# Patient Record
Sex: Female | Born: 1975 | Race: White | Hispanic: No | Smoking: Never smoker
Health system: Southern US, Community
[De-identification: ages and names within clinical notes are randomized; demographics above are authoritative.]

## PROBLEM LIST (undated history)

## (undated) DIAGNOSIS — F329 Major depressive disorder, single episode, unspecified: Secondary | ICD-10-CM

## (undated) DIAGNOSIS — I10 Essential (primary) hypertension: Secondary | ICD-10-CM

## (undated) DIAGNOSIS — E079 Disorder of thyroid, unspecified: Secondary | ICD-10-CM

## (undated) DIAGNOSIS — R569 Unspecified convulsions: Secondary | ICD-10-CM

## (undated) DIAGNOSIS — R200 Anesthesia of skin: Secondary | ICD-10-CM

## (undated) DIAGNOSIS — R251 Tremor, unspecified: Secondary | ICD-10-CM

## (undated) DIAGNOSIS — R202 Paresthesia of skin: Secondary | ICD-10-CM

## (undated) DIAGNOSIS — F32A Depression, unspecified: Secondary | ICD-10-CM

## (undated) DIAGNOSIS — F419 Anxiety disorder, unspecified: Secondary | ICD-10-CM

## (undated) DIAGNOSIS — G2581 Restless legs syndrome: Secondary | ICD-10-CM

## (undated) DIAGNOSIS — E876 Hypokalemia: Secondary | ICD-10-CM

## (undated) DIAGNOSIS — I2510 Atherosclerotic heart disease of native coronary artery without angina pectoris: Secondary | ICD-10-CM

## (undated) DIAGNOSIS — M797 Fibromyalgia: Secondary | ICD-10-CM

## (undated) DIAGNOSIS — E162 Hypoglycemia, unspecified: Secondary | ICD-10-CM

## (undated) DIAGNOSIS — G43909 Migraine, unspecified, not intractable, without status migrainosus: Secondary | ICD-10-CM

## (undated) DIAGNOSIS — E039 Hypothyroidism, unspecified: Secondary | ICD-10-CM

## (undated) DIAGNOSIS — E781 Pure hyperglyceridemia: Secondary | ICD-10-CM

## (undated) HISTORY — PX: ABDOMINAL HYSTERECTOMY: SHX81

## (undated) HISTORY — DX: Major depressive disorder, single episode, unspecified: F32.9

## (undated) HISTORY — DX: Anesthesia of skin: R20.2

## (undated) HISTORY — DX: Migraine, unspecified, not intractable, without status migrainosus: G43.909

## (undated) HISTORY — DX: Atherosclerotic heart disease of native coronary artery without angina pectoris: I25.10

## (undated) HISTORY — DX: Unspecified convulsions: R56.9

## (undated) HISTORY — DX: Disorder of thyroid, unspecified: E07.9

## (undated) HISTORY — DX: Tremor, unspecified: R25.1

## (undated) HISTORY — DX: Anxiety disorder, unspecified: F41.9

## (undated) HISTORY — DX: Hypothyroidism, unspecified: E03.9

## (undated) HISTORY — DX: Hypoglycemia, unspecified: E16.2

## (undated) HISTORY — DX: Hypokalemia: E87.6

## (undated) HISTORY — DX: Fibromyalgia: M79.7

## (undated) HISTORY — DX: Anesthesia of skin: R20.0

## (undated) HISTORY — DX: Pure hyperglyceridemia: E78.1

## (undated) HISTORY — DX: Depression, unspecified: F32.A

## (undated) HISTORY — PX: BREAST BIOPSY: SHX20

## (undated) HISTORY — DX: Restless legs syndrome: G25.81

---

## 1998-11-13 ENCOUNTER — Emergency Department (HOSPITAL_COMMUNITY): Admission: EM | Admit: 1998-11-13 | Discharge: 1998-11-13 | Payer: Self-pay | Admitting: Emergency Medicine

## 1999-03-02 ENCOUNTER — Inpatient Hospital Stay (HOSPITAL_COMMUNITY): Admission: AD | Admit: 1999-03-02 | Discharge: 1999-03-02 | Payer: Self-pay | Admitting: Obstetrics and Gynecology

## 1999-03-10 ENCOUNTER — Other Ambulatory Visit: Admission: RE | Admit: 1999-03-10 | Discharge: 1999-03-10 | Payer: Self-pay | Admitting: Obstetrics and Gynecology

## 1999-09-24 ENCOUNTER — Inpatient Hospital Stay (HOSPITAL_COMMUNITY): Admission: AD | Admit: 1999-09-24 | Discharge: 1999-09-26 | Payer: Self-pay | Admitting: Obstetrics and Gynecology

## 1999-10-28 ENCOUNTER — Other Ambulatory Visit: Admission: RE | Admit: 1999-10-28 | Discharge: 1999-10-28 | Payer: Self-pay | Admitting: Obstetrics and Gynecology

## 2000-11-01 ENCOUNTER — Other Ambulatory Visit: Admission: RE | Admit: 2000-11-01 | Discharge: 2000-11-01 | Payer: Self-pay | Admitting: Obstetrics and Gynecology

## 2001-07-27 ENCOUNTER — Ambulatory Visit (HOSPITAL_COMMUNITY): Admission: RE | Admit: 2001-07-27 | Discharge: 2001-07-27 | Payer: Self-pay | Admitting: Gastroenterology

## 2001-07-27 ENCOUNTER — Encounter: Payer: Self-pay | Admitting: Gastroenterology

## 2001-12-14 ENCOUNTER — Other Ambulatory Visit: Admission: RE | Admit: 2001-12-14 | Discharge: 2001-12-14 | Payer: Self-pay | Admitting: Obstetrics and Gynecology

## 2002-02-27 ENCOUNTER — Encounter (INDEPENDENT_AMBULATORY_CARE_PROVIDER_SITE_OTHER): Payer: Self-pay | Admitting: Specialist

## 2002-02-27 ENCOUNTER — Ambulatory Visit (HOSPITAL_COMMUNITY): Admission: RE | Admit: 2002-02-27 | Discharge: 2002-02-27 | Payer: Self-pay | Admitting: Obstetrics and Gynecology

## 2003-01-06 HISTORY — PX: ABDOMINAL HYSTERECTOMY: SHX81

## 2003-01-29 ENCOUNTER — Other Ambulatory Visit: Admission: RE | Admit: 2003-01-29 | Discharge: 2003-01-29 | Payer: Self-pay | Admitting: Obstetrics and Gynecology

## 2004-02-14 ENCOUNTER — Other Ambulatory Visit: Admission: RE | Admit: 2004-02-14 | Discharge: 2004-02-14 | Payer: Self-pay | Admitting: Obstetrics and Gynecology

## 2004-03-26 ENCOUNTER — Encounter (INDEPENDENT_AMBULATORY_CARE_PROVIDER_SITE_OTHER): Payer: Self-pay | Admitting: *Deleted

## 2004-03-26 ENCOUNTER — Observation Stay (HOSPITAL_COMMUNITY): Admission: RE | Admit: 2004-03-26 | Discharge: 2004-03-27 | Payer: Self-pay | Admitting: Obstetrics and Gynecology

## 2005-03-09 ENCOUNTER — Other Ambulatory Visit: Admission: RE | Admit: 2005-03-09 | Discharge: 2005-03-09 | Payer: Self-pay | Admitting: Obstetrics and Gynecology

## 2007-10-21 ENCOUNTER — Encounter: Payer: Self-pay | Admitting: Internal Medicine

## 2008-07-26 ENCOUNTER — Encounter: Payer: Self-pay | Admitting: Internal Medicine

## 2008-08-03 ENCOUNTER — Ambulatory Visit: Payer: Self-pay | Admitting: Internal Medicine

## 2008-08-03 DIAGNOSIS — E039 Hypothyroidism, unspecified: Secondary | ICD-10-CM | POA: Insufficient documentation

## 2008-09-26 ENCOUNTER — Ambulatory Visit: Payer: Self-pay | Admitting: Internal Medicine

## 2008-09-26 DIAGNOSIS — F329 Major depressive disorder, single episode, unspecified: Secondary | ICD-10-CM

## 2008-09-26 LAB — CONVERTED CEMR LAB: TSH: 1.67 microintl units/mL (ref 0.35–5.50)

## 2008-09-28 ENCOUNTER — Telehealth: Payer: Self-pay | Admitting: Internal Medicine

## 2008-12-13 ENCOUNTER — Telehealth: Payer: Self-pay | Admitting: Internal Medicine

## 2008-12-24 ENCOUNTER — Ambulatory Visit: Payer: Self-pay | Admitting: Internal Medicine

## 2008-12-24 ENCOUNTER — Telehealth (INDEPENDENT_AMBULATORY_CARE_PROVIDER_SITE_OTHER): Payer: Self-pay | Admitting: *Deleted

## 2008-12-24 LAB — CONVERTED CEMR LAB
BUN: 8 mg/dL (ref 6–23)
Basophils Absolute: 0 10*3/uL (ref 0.0–0.1)
Basophils Relative: 0.9 % (ref 0.0–3.0)
CO2: 31 meq/L (ref 19–32)
Calcium: 9.2 mg/dL (ref 8.4–10.5)
Chloride: 102 meq/L (ref 96–112)
Creatinine, Ser: 0.7 mg/dL (ref 0.4–1.2)
Eosinophils Absolute: 0.1 10*3/uL (ref 0.0–0.7)
Eosinophils Relative: 1.8 % (ref 0.0–5.0)
GFR calc non Af Amer: 101.63 mL/min (ref >60)
Glucose, Bld: 65 mg/dL — ABNORMAL LOW (ref 70–99)
HCT: 42.5 % (ref 36.0–46.0)
Hemoglobin: 14.6 g/dL (ref 12.0–15.0)
Lymphocytes Relative: 33.2 % (ref 12.0–46.0)
Lymphs Abs: 1.5 10*3/uL (ref 0.7–4.0)
MCHC: 34.3 g/dL (ref 30.0–36.0)
MCV: 94.8 fL (ref 78.0–100.0)
Monocytes Absolute: 0.5 10*3/uL (ref 0.1–1.0)
Monocytes Relative: 10.5 % (ref 3.0–12.0)
Neutro Abs: 2.5 10*3/uL (ref 1.4–7.7)
Neutrophils Relative %: 53.6 % (ref 43.0–77.0)
Platelets: 191 10*3/uL (ref 150.0–400.0)
Potassium: 3.4 meq/L — ABNORMAL LOW (ref 3.5–5.1)
RBC: 4.48 M/uL (ref 3.87–5.11)
RDW: 12 % (ref 11.5–14.6)
Sodium: 140 meq/L (ref 135–145)
TSH: 1.66 microintl units/mL (ref 0.35–5.50)
WBC: 4.6 10*3/uL (ref 4.5–10.5)

## 2008-12-25 ENCOUNTER — Encounter: Payer: Self-pay | Admitting: Internal Medicine

## 2008-12-25 ENCOUNTER — Telehealth (INDEPENDENT_AMBULATORY_CARE_PROVIDER_SITE_OTHER): Payer: Self-pay | Admitting: *Deleted

## 2009-01-09 ENCOUNTER — Ambulatory Visit: Payer: Self-pay | Admitting: Internal Medicine

## 2009-01-09 LAB — CONVERTED CEMR LAB
BUN: 9 mg/dL (ref 6–23)
CO2: 32 meq/L (ref 19–32)
Calcium: 9.1 mg/dL (ref 8.4–10.5)
Chloride: 105 meq/L (ref 96–112)
Creatinine, Ser: 0.6 mg/dL (ref 0.4–1.2)
GFR calc non Af Amer: 121.39 mL/min (ref >60)
Glucose, Bld: 58 mg/dL — ABNORMAL LOW (ref 70–99)
Insulin: 13 microintl units/mL (ref 3–28)
Potassium: 4 meq/L (ref 3.5–5.1)
Sodium: 141 meq/L (ref 135–145)

## 2009-01-10 ENCOUNTER — Telehealth (INDEPENDENT_AMBULATORY_CARE_PROVIDER_SITE_OTHER): Payer: Self-pay | Admitting: *Deleted

## 2009-01-10 ENCOUNTER — Encounter: Payer: Self-pay | Admitting: Internal Medicine

## 2009-01-10 ENCOUNTER — Telehealth: Payer: Self-pay | Admitting: Internal Medicine

## 2009-01-30 ENCOUNTER — Ambulatory Visit: Payer: Self-pay | Admitting: Endocrinology

## 2009-02-11 ENCOUNTER — Telehealth: Payer: Self-pay | Admitting: Endocrinology

## 2009-04-23 ENCOUNTER — Telehealth: Payer: Self-pay | Admitting: Internal Medicine

## 2009-05-03 ENCOUNTER — Telehealth: Payer: Self-pay | Admitting: Internal Medicine

## 2009-05-21 ENCOUNTER — Ambulatory Visit: Payer: Self-pay | Admitting: Internal Medicine

## 2009-05-21 LAB — CONVERTED CEMR LAB: TSH: 2.13 microintl units/mL (ref 0.35–5.50)

## 2009-05-22 ENCOUNTER — Encounter: Payer: Self-pay | Admitting: Internal Medicine

## 2009-05-23 ENCOUNTER — Telehealth: Payer: Self-pay | Admitting: Internal Medicine

## 2009-08-05 ENCOUNTER — Telehealth: Payer: Self-pay | Admitting: Internal Medicine

## 2009-08-12 ENCOUNTER — Telehealth: Payer: Self-pay | Admitting: Internal Medicine

## 2009-08-15 ENCOUNTER — Ambulatory Visit: Payer: Self-pay | Admitting: Internal Medicine

## 2009-08-15 DIAGNOSIS — IMO0001 Reserved for inherently not codable concepts without codable children: Secondary | ICD-10-CM

## 2009-08-15 DIAGNOSIS — G2581 Restless legs syndrome: Secondary | ICD-10-CM | POA: Insufficient documentation

## 2009-08-15 LAB — CONVERTED CEMR LAB
ALT: 26 units/L (ref 0–35)
AST: 24 units/L (ref 0–37)
Albumin: 4.5 g/dL (ref 3.5–5.2)
Alkaline Phosphatase: 46 units/L (ref 39–117)
BUN: 13 mg/dL (ref 6–23)
Basophils Absolute: 0 10*3/uL (ref 0.0–0.1)
Basophils Relative: 0.7 % (ref 0.0–3.0)
Bilirubin, Direct: 0.1 mg/dL (ref 0.0–0.3)
CO2: 33 meq/L — ABNORMAL HIGH (ref 19–32)
Calcium: 9.2 mg/dL (ref 8.4–10.5)
Chloride: 102 meq/L (ref 96–112)
Creatinine, Ser: 0.6 mg/dL (ref 0.4–1.2)
Eosinophils Absolute: 0.1 10*3/uL (ref 0.0–0.7)
Eosinophils Relative: 1.2 % (ref 0.0–5.0)
GFR calc non Af Amer: 112.28 mL/min (ref >60)
Glucose, Bld: 67 mg/dL — ABNORMAL LOW (ref 70–99)
HCT: 38.7 % (ref 36.0–46.0)
Hemoglobin: 13.3 g/dL (ref 12.0–15.0)
Lymphocytes Relative: 23.6 % (ref 12.0–46.0)
Lymphs Abs: 1.2 10*3/uL (ref 0.7–4.0)
MCHC: 34.3 g/dL (ref 30.0–36.0)
MCV: 94.1 fL (ref 78.0–100.0)
Monocytes Absolute: 0.4 10*3/uL (ref 0.1–1.0)
Monocytes Relative: 8.7 % (ref 3.0–12.0)
Neutro Abs: 3.3 10*3/uL (ref 1.4–7.7)
Neutrophils Relative %: 65.8 % (ref 43.0–77.0)
Platelets: 205 10*3/uL (ref 150.0–400.0)
Potassium: 3.9 meq/L (ref 3.5–5.1)
RBC: 4.11 M/uL (ref 3.87–5.11)
RDW: 12.5 % (ref 11.5–14.6)
Sed Rate: 10 mm/hr (ref 0–22)
Sodium: 142 meq/L (ref 135–145)
TSH: 1.29 microintl units/mL (ref 0.35–5.50)
Total Bilirubin: 0.6 mg/dL (ref 0.3–1.2)
Total CK: 73 units/L (ref 7–177)
Total Protein: 7.1 g/dL (ref 6.0–8.3)
WBC: 5.1 10*3/uL (ref 4.5–10.5)

## 2009-08-16 ENCOUNTER — Telehealth: Payer: Self-pay | Admitting: Internal Medicine

## 2009-10-29 ENCOUNTER — Telehealth: Payer: Self-pay | Admitting: Internal Medicine

## 2009-11-25 ENCOUNTER — Telehealth: Payer: Self-pay | Admitting: Internal Medicine

## 2009-12-10 ENCOUNTER — Ambulatory Visit: Payer: Self-pay | Admitting: Internal Medicine

## 2009-12-10 LAB — CONVERTED CEMR LAB: TSH: 1.39 microintl units/mL (ref 0.35–5.50)

## 2009-12-13 ENCOUNTER — Telehealth: Payer: Self-pay | Admitting: Internal Medicine

## 2010-01-27 ENCOUNTER — Telehealth: Payer: Self-pay | Admitting: Internal Medicine

## 2010-02-04 NOTE — Assessment & Plan Note (Signed)
Summary: PER PT 2-3 WK FU--POTASSIUM  STC   Vital Signs:  Patient profile:   35 year old female Height:      65 inches Weight:      147 pounds BMI:     24.55 O2 Sat:      99 % on Room air Temp:     97.8 degrees F oral Pulse rate:   78 / minute Pulse rhythm:   regular BP sitting:   108 / 64  (left arm) Cuff size:   large  Vitals Entered By: Rock Nephew CMA (January 09, 2009 9:57 AM)  O2 Flow:  Room air CC: follow-up visit, Depression   Primary Care Provider:  Yetta Barre  CC:  follow-up visit and Depression.  History of Present Illness: She returns for f/up and reports that she feels better with much less dizziness and fatigue. She has liberalized her intake of fruits, especially bananas.   Preventive Screening-Counseling & Management  Alcohol-Tobacco     Alcohol drinks/day: 0     Smoking Status: never  Clinical Review Panels:  CBC   WBC:  4.6 (12/24/2008)   RBC:  4.48 (12/24/2008)   Hgb:  14.6 (12/24/2008)   Hct:  42.5 (12/24/2008)   Platelets:  191.0 (12/24/2008)   MCV  94.8 (12/24/2008)   MCHC  34.3 (12/24/2008)   RDW  12.0 (12/24/2008)   PMN:  53.6 (12/24/2008)   Lymphs:  33.2 (12/24/2008)   Monos:  10.5 (12/24/2008)   Eosinophils:  1.8 (12/24/2008)   Basophil:  0.9 (12/24/2008)  Complete Metabolic Panel   Glucose:  65 (12/24/2008)   Sodium:  140 (12/24/2008)   Potassium:  3.4 (12/24/2008)   Chloride:  102 (12/24/2008)   CO2:  31 (12/24/2008)   BUN:  8 (12/24/2008)   Creatinine:  0.7 (12/24/2008)   Calcium:  9.2 (12/24/2008)   Current Medications (verified): 1)  Synthroid 50 Mcg Tabs (Levothyroxine Sodium) .Marland Kitchen.. 1 Tab By Mouth Once Daily 2)  Cymbalta 30 Mg Cpep (Duloxetine Hcl) .... Once Daily For Depression  Allergies (verified): No Known Drug Allergies  Past History:  Past Medical History: Reviewed history from 12/24/2008 and no changes required. Endometriosis  Hypothyroidism  Past Surgical History: Reviewed history from 08/03/2008 and  no changes required. Hysterectomy  Family History: Reviewed history from 08/03/2008 and no changes required. Family History Diabetes 1st degree relative Family History High cholesterol Family History Hypertension Family History Thyroid disease  Social History: Reviewed history from 08/03/2008 and no changes required. Married Never Smoked Alcohol use-no Drug use-no Regular exercise-yes  Review of Systems  The patient denies anorexia, weight loss, weight gain, chest pain, abdominal pain, and depression.   Psych:  Denies anxiety, depression, easily angered, easily tearful, irritability, mental problems, panic attacks, sense of great danger, suicidal thoughts/plans, and thoughts of violence.  Physical Exam  General:  alert, well-developed, well-nourished, well-hydrated, normal appearance, healthy-appearing, cooperative to examination, and good hygiene.   Mouth:  Oral mucosa and oropharynx without lesions or exudates.  Teeth in good repair. Neck:  supple, full ROM, no masses, and no neck tenderness.   Lungs:  Normal respiratory effort, chest expands symmetrically. Lungs are clear to auscultation, no crackles or wheezes. Heart:  Normal rate and regular rhythm. S1 and S2 normal without gallop, murmur, click, rub or other extra sounds. Abdomen:  Bowel sounds positive,abdomen soft and non-tender without masses, organomegaly or hernias noted. Skin:  Intact without suspicious lesions or rashes Psych:  Cognition and judgment appear intact. Alert and cooperative with  normal attention span and concentration. No apparent delusions, illusions, hallucinations   Impression & Recommendations:  Problem # 1:  HYPOGLYCEMIA, UNSPECIFIED (ICD-251.2) Assessment Improved will check a random insulin level to look for hyperinsulinemia. Orders: Venipuncture (16109) TLB-BMP (Basic Metabolic Panel-BMET) (80048-METABOL) T- * Misc. Laboratory test [id number])  Problem # 2:  HYPOKALEMIA, MILD  (ICD-276.8) Assessment: Improved  Orders: Venipuncture (09811) TLB-BMP (Basic Metabolic Panel-BMET) (80048-METABOL)  Problem # 3:  DEPRESSIVE DISORDER (ICD-311) Assessment: Improved  Her updated medication list for this problem includes:    Cymbalta 30 Mg Cpep (Duloxetine hcl) ..... Once daily for depression  Discussed treatment options, including trial of antidpressant medication. Will refer to behavioral health if needed for for now she is doing well. Follow-up call in in 24-48 hours and recheck in 2 weeks, sooner as needed. Patient agrees to call if any worsening of symptoms or thoughts of doing harm arise. Verified that the patient has no suicidal ideation at this time.   Problem # 4:  HYPOTHYROIDISM (ICD-244.9) Assessment: Unchanged  Her updated medication list for this problem includes:    Synthroid 50 Mcg Tabs (Levothyroxine sodium) .Marland Kitchen... 1 tab by mouth once daily  Labs Reviewed: TSH: 1.66 (12/24/2008)     Complete Medication List: 1)  Synthroid 50 Mcg Tabs (Levothyroxine sodium) .Marland Kitchen.. 1 tab by mouth once daily 2)  Cymbalta 30 Mg Cpep (Duloxetine hcl) .... Once daily for depression  Patient Instructions: 1)  Please schedule a follow-up appointment in 3 months.

## 2010-02-04 NOTE — Progress Notes (Signed)
  Phone Note Outgoing Call   Summary of Call: pt. notified of persistent low blood sugar and questionably high insulin so she was referred to endo Initial call taken by: Etta Grandchild MD,  January 10, 2009 7:51 AM

## 2010-02-04 NOTE — Progress Notes (Signed)
Summary: ANXIETY  Phone Note Call from Patient Call back at Home Phone [phone]   Summary of Call: Patient is not seeing a difference regarding anxiety on Fluoxetine. She say OCD is ok but anxiety is the main problem. She would like to know if increase in fluoxetine is a possiblity? Or resume cymbalta and try to get help from drug company regarding cost.  Initial call taken by: Lamar Sprinkles, CMA,  August 05, 2009 10:08 AM  Follow-up for Phone Call        is it is mostly anxiety then we should switch to sertraline Follow-up by: Etta Grandchild MD,  August 05, 2009 10:10 AM  Additional Follow-up for Phone Call Additional follow up Details #1::        pt states that she has taken Zoloft before which did not work for her. She states the cymbalta worked great ( she just cant afford it) what do you suggest? Additional Follow-up by: Ami Bullins CMA,  August 06, 2009 8:44 AM    Additional Follow-up for Phone Call Additional follow up Details #2::    cymbalta samples Follow-up by: Etta Grandchild MD,  August 06, 2009 8:49 AM  Additional Follow-up for Phone Call Additional follow up Details #3:: Details for Additional Follow-up Action Taken: informed pt and samples are up front to pick up Additional Follow-up by: Ami Bullins CMA,  August 06, 2009 9:00 AM

## 2010-02-04 NOTE — Assessment & Plan Note (Signed)
Summary: f/u appt per pt/#/cd   Vital Signs:  Patient profile:   35 year old female Height:      65 inches Weight:      151.50 pounds BMI:     25.30 O2 Sat:      98 % on Room air Temp:     97.9 degrees F oral Pulse rate:   89 / minute Pulse rhythm:   regular Resp:     16 per minute BP sitting:   88 / 60  (left arm) Cuff size:   regular  Vitals Entered By: Lucious Groves (May 21, 2009 11:27 AM)  Nutrition Counseling: Patient's BMI is greater than 25 and therefore counseled on weight management options.  O2 Flow:  Room air CC: Follow-up visit./kb, Depression Is Patient Diabetic? No Pain Assessment Patient in pain? no      Comments Patient states that she is somewhat better./kb   Primary Care Provider:  Yetta Barre  CC:  Follow-up visit./kb and Depression.  History of Present Illness: She returns for f/up and feels well, she has not had any more dizziness and when she checks her BS it has not been below 70. She thinks the co-pay for Cymbalta is too high so she asks to switch to a generic.  Depression History:      The patient denies a depressed mood most of the day and a diminished interest in her usual daily activities.  The patient denies significant weight loss, significant weight gain, insomnia, hypersomnia, psychomotor agitation, psychomotor retardation, fatigue (loss of energy), feelings of worthlessness (guilt), impaired concentration (indecisiveness), and recurrent thoughts of death or suicide.  The patient denies symptoms of a manic disorder including persistently & abnormally elevated mood, abnormally & persistently irritable mood, less need for sleep, talkative or feels need to keep talking, distractibility, flight of ideas, increase in goal-directed activity, and psychomotor agitation.        Risk factors for depression include a personal history of depression.  The patient denies that she feels like life is not worth living, denies that she wishes that she were dead, and  denies that she has thought about ending her life.         Current Medications (verified): 1)  Synthroid 50 Mcg Tabs (Levothyroxine Sodium) .Marland Kitchen.. 1 Tab By Mouth Once Daily 2)  Cymbalta 30 Mg Cpep (Duloxetine Hcl) .... Once Daily For Depression  Allergies (verified): No Known Drug Allergies  Past History:  Past Medical History: Reviewed history from 01/30/2009 and no changes required. HYPOGLYCEMIA, UNSPECIFIED (ICD-251.2) HYPOKALEMIA, MILD (ICD-276.8) HYPOTHYROIDISM (ICD-244.9) DEPRESSIVE DISORDER (ICD-311) UNSPECIFIED HYPOTHYROIDISM (ICD-244.9) FAMILY HISTORY DIABETES 1ST DEGREE RELATIVE (ICD-V18.0)  Past Surgical History: Reviewed history from 08/03/2008 and no changes required. Hysterectomy  Family History: Reviewed history from 01/30/2009 and no changes required. Family History Diabetes 1st degree relative (mother) Family History High cholesterol Family History Hypertension Family History Thyroid disease  Social History: Reviewed history from 01/30/2009 and no changes required. Married Never Smoked Alcohol use-no Drug use-no Regular exercise-yes does not work outside the home  Review of Systems  The patient denies chest pain, prolonged cough, headaches, hemoptysis, abdominal pain, suspicious skin lesions, depression, unusual weight change, and enlarged lymph nodes.   Endo:  Denies cold intolerance, excessive hunger, excessive thirst, excessive urination, heat intolerance, polyuria, and weight change.  Physical Exam  General:  alert, well-developed, well-nourished, well-hydrated, normal appearance, healthy-appearing, cooperative to examination, and good hygiene.   Head:  normocephalic and atraumatic.   Mouth:  Oral mucosa and oropharynx  without lesions or exudates.  Teeth in good repair. Neck:  supple, full ROM, no masses, and no neck tenderness.   Lungs:  Normal respiratory effort, chest expands symmetrically. Lungs are clear to auscultation, no crackles or  wheezes. Heart:  Normal rate and regular rhythm. S1 and S2 normal without gallop, murmur, click, rub or other extra sounds. Abdomen:  Bowel sounds positive,abdomen soft and non-tender without masses, organomegaly or hernias noted. Msk:  normal ROM, no joint tenderness, no joint swelling, no joint warmth, no redness over joints, no joint deformities, and no joint instability.   Pulses:  R and L carotid,radial,femoral,dorsalis pedis and posterior tibial pulses are full and equal bilaterally Neurologic:  No cranial nerve deficits noted. Station and gait are normal. Plantar reflexes are down-going bilaterally. DTRs are symmetrical throughout. Sensory, motor and coordinative functions appear intact. Skin:  Intact without suspicious lesions or rashes Psych:  Cognition and judgment appear intact. Alert and cooperative with normal attention span and concentration. No apparent delusions, illusions, hallucinations   Impression & Recommendations:  Problem # 1:  DIZZINESS (ICD-780.4) Assessment Improved  Problem # 2:  HYPOGLYCEMIA, UNSPECIFIED (ICD-251.2) Assessment: Improved  Problem # 3:  HYPOTHYROIDISM (ICD-244.9) Assessment: Unchanged  Her updated medication list for this problem includes:    Synthroid 50 Mcg Tabs (Levothyroxine sodium) .Marland Kitchen... 1 tab by mouth once daily  Orders: Venipuncture (23557) TLB-TSH (Thyroid Stimulating Hormone) (84443-TSH)  Labs Reviewed: TSH: 1.66 (12/24/2008)     Problem # 4:  DEPRESSIVE DISORDER (ICD-311) Assessment: Improved  The following medications were removed from the medication list:    Cymbalta 30 Mg Cpep (Duloxetine hcl) ..... Once daily for depression Her updated medication list for this problem includes:    Venlafaxine Hcl 37.5 Mg Xr24h-cap (Venlafaxine hcl) ..... One by mouth once daily  Discussed treatment options, including trial of antidpressant medication. Will refer to behavioral health. Follow-up call in in 24-48 hours and recheck in 2  weeks, sooner as needed. Patient agrees to call if any worsening of symptoms or thoughts of doing harm arise. Verified that the patient has no suicidal ideation at this time.   Complete Medication List: 1)  Synthroid 50 Mcg Tabs (Levothyroxine sodium) .Marland Kitchen.. 1 tab by mouth once daily 2)  Venlafaxine Hcl 37.5 Mg Xr24h-cap (Venlafaxine hcl) .... One by mouth once daily  Patient Instructions: 1)  Please schedule a follow-up appointment in 4 months. Prescriptions: VENLAFAXINE HCL 37.5 MG XR24H-CAP (VENLAFAXINE HCL) One by mouth once daily  #30 x 11   Entered and Authorized by:   Etta Grandchild MD   Signed by:   Etta Grandchild MD on 05/21/2009   Method used:   Print then Give to Patient   RxID:   [id number]

## 2010-02-04 NOTE — Assessment & Plan Note (Signed)
Summary: leg cramps/SD   Vital Signs:  Patient profile:   35 year old female Height:      65 inches Weight:      150 pounds BMI:     25.05 O2 Sat:      97 % on Room air Temp:     98.0 degrees F oral Pulse rate:   80 / minute Pulse rhythm:   regular Resp:     16 per minute BP sitting:   100 / 70  (left arm) Cuff size:   regular  Vitals Entered By: Lanier Prude, CMA(AAMA) (August 15, 2009 1:08 PM)  O2 Flow:  Room air CC: bilat leg pain/aches X 1 wk, Depression Is Patient Diabetic? No Comments pt states she is taking Cymbalta 30mg  1 once daily instead of Fluoxetine 20mg    Primary Care Nicholes Hibler:  Yetta Barre  CC:  bilat leg pain/aches X 1 wk and Depression.  History of Present Illness: She returns c/o a several week hx. of her legs aching and moving during the night, she has not slept well in one week and her husband says that her legs twitch and move during the night.   Preventive Screening-Counseling & Management  Alcohol-Tobacco     Alcohol drinks/day: 0     Smoking Status: never  Hep-HIV-STD-Contraception     Hepatitis Risk: no risk noted     HIV Risk: no risk noted     STD Risk: no risk noted      Sexual History:  currently monogamous.        Drug Use:  no.        Blood Transfusions:  no.    Medications Prior to Update: 1)  Synthroid 50 Mcg Tabs (Levothyroxine Sodium) .Marland Kitchen.. 1 Tab By Mouth Once Daily 2)  Fluoxetine Hcl 20 Mg Caps (Fluoxetine Hcl) .Marland Kitchen.. 1 By Mouth Once Daily  Current Medications (verified): 1)  Synthroid 50 Mcg Tabs (Levothyroxine Sodium) .Marland Kitchen.. 1 Tab By Mouth Once Daily 2)  Fluoxetine Hcl 20 Mg Caps (Fluoxetine Hcl) .Marland Kitchen.. 1 By Mouth Once Daily 3)  Klonopin 1 Mg Tabs (Clonazepam) .... One By Mouth At Bedtime For Restless Legs  Allergies (verified): No Known Drug Allergies  Past History:  Past Medical History: Last updated: 01/30/2009 HYPOGLYCEMIA, UNSPECIFIED (ICD-251.2) HYPOKALEMIA, MILD (ICD-276.8) HYPOTHYROIDISM (ICD-244.9) DEPRESSIVE  DISORDER (ICD-311) UNSPECIFIED HYPOTHYROIDISM (ICD-244.9) FAMILY HISTORY DIABETES 1ST DEGREE RELATIVE (ICD-V18.0)  Past Surgical History: Last updated: 08/03/2008 Hysterectomy  Family History: Last updated: 01/30/2009 Family History Diabetes 1st degree relative (mother) Family History High cholesterol Family History Hypertension Family History Thyroid disease  Social History: Last updated: 01/30/2009 Married Never Smoked Alcohol use-no Drug use-no Regular exercise-yes does not work outside the home  Risk Factors: Alcohol Use: 0 (08/15/2009) Exercise: yes (08/03/2008)  Risk Factors: Smoking Status: never (08/15/2009)  Family History: Reviewed history from 01/30/2009 and no changes required. Family History Diabetes 1st degree relative (mother) Family History High cholesterol Family History Hypertension Family History Thyroid disease  Social History: Reviewed history from 01/30/2009 and no changes required. Married Never Smoked Alcohol use-no Drug use-no Regular exercise-yes does not work outside the home  Review of Systems  The patient denies anorexia, weight loss, weight gain, chest pain, syncope, dyspnea on exertion, peripheral edema, abdominal pain, muscle weakness, suspicious skin lesions, transient blindness, difficulty walking, depression, and enlarged lymph nodes.   MS:  Complains of muscle aches; denies joint pain, joint redness, joint swelling, loss of strength, low back pain, mid back pain, muscle, cramps, muscle weakness, stiffness, and  thoracic pain. Psych:  Denies anxiety, depression, easily angered, easily tearful, irritability, mental problems, panic attacks, sense of great danger, suicidal thoughts/plans, thoughts of violence, unusual visions or sounds, and thoughts /plans of harming others. Endo:  Denies cold intolerance, excessive hunger, excessive thirst, excessive urination, heat intolerance, polyuria, and weight change.  Physical  Exam  General:  alert, well-developed, well-nourished, well-hydrated, normal appearance, healthy-appearing, cooperative to examination, and good hygiene.   Head:  normocephalic and atraumatic.   Eyes:  vision grossly intact, pupils equal, pupils round, and pupils reactive to light.   Mouth:  Oral mucosa and oropharynx without lesions or exudates.  Teeth in good repair. Neck:  supple, full ROM, no masses, and no neck tenderness.   Lungs:  Normal respiratory effort, chest expands symmetrically. Lungs are clear to auscultation, no crackles or wheezes. Heart:  Normal rate and regular rhythm. S1 and S2 normal without gallop, murmur, click, rub or other extra sounds. Abdomen:  Bowel sounds positive,abdomen soft and non-tender without masses, organomegaly or hernias noted. Msk:  normal ROM, no joint tenderness, no joint swelling, no joint warmth, no redness over joints, no joint deformities, and no joint instability.   Pulses:  R and L carotid,radial,femoral,dorsalis pedis and posterior tibial pulses are full and equal bilaterally Extremities:  No clubbing, cyanosis, edema, or deformity noted with normal full range of motion of all joints.   Neurologic:  No cranial nerve deficits noted. Station and gait are normal. Plantar reflexes are down-going bilaterally. DTRs are symmetrical throughout. Sensory, motor and coordinative functions appear intact. Skin:  Intact without suspicious lesions or rashes Cervical Nodes:  No lymphadenopathy noted Axillary Nodes:  no R axillary adenopathy and no L axillary adenopathy.   Inguinal Nodes:  no R inguinal adenopathy and no L inguinal adenopathy.   Psych:  Cognition and judgment appear intact. Alert and cooperative with normal attention span and concentration. No apparent delusions, illusions, hallucinations   Impression & Recommendations:  Problem # 1:  UNSPECIFIED MYALGIA AND MYOSITIS (ICD-729.1) Assessment New will look for secondary  causes Orders: Venipuncture (16010) TLB-BMP (Basic Metabolic Panel-BMET) (80048-METABOL) TLB-CBC Platelet - w/Differential (85025-CBCD) TLB-Hepatic/Liver Function Pnl (80076-HEPATIC) TLB-TSH (Thyroid Stimulating Hormone) (84443-TSH) TLB-CK Total Only(Creatine Kinase/CPK) (82550-CK) TLB-Sedimentation Rate (ESR) (85652-ESR)  Problem # 2:  RESTLESS LEG SYNDROME (ICD-333.94) Assessment: New try Klonopin Orders: Venipuncture (93235) TLB-BMP (Basic Metabolic Panel-BMET) (80048-METABOL) TLB-CBC Platelet - w/Differential (85025-CBCD) TLB-Hepatic/Liver Function Pnl (80076-HEPATIC) TLB-TSH (Thyroid Stimulating Hormone) (84443-TSH) TLB-CK Total Only(Creatine Kinase/CPK) (82550-CK) TLB-Sedimentation Rate (ESR) (85652-ESR)  Problem # 3:  HYPOTHYROIDISM (ICD-244.9) Assessment: Unchanged  Her updated medication list for this problem includes:    Synthroid 50 Mcg Tabs (Levothyroxine sodium) .Marland Kitchen... 1 tab by mouth once daily  Orders: Venipuncture (57322) TLB-BMP (Basic Metabolic Panel-BMET) (80048-METABOL) TLB-CBC Platelet - w/Differential (85025-CBCD) TLB-Hepatic/Liver Function Pnl (80076-HEPATIC) TLB-TSH (Thyroid Stimulating Hormone) (84443-TSH) TLB-CK Total Only(Creatine Kinase/CPK) (82550-CK) TLB-Sedimentation Rate (ESR) (85652-ESR)  Labs Reviewed: TSH: 2.13 (05/21/2009)     Problem # 4:  DEPRESSIVE DISORDER (ICD-311) Assessment: Improved  The following medications were removed from the medication list:    Fluoxetine Hcl 20 Mg Caps (Fluoxetine hcl) .Marland Kitchen... 1 by mouth once daily Her updated medication list for this problem includes:    Klonopin 1 Mg Tabs (Clonazepam) ..... One by mouth at bedtime for restless legs    Cymbalta 30 Mg Cpep (Duloxetine hcl) ..... Once daily  Discussed treatment options, including trial of antidpressant medication. Will refer to behavioral health. Follow-up call in in 24-48 hours and recheck in 2 weeks,  sooner as needed. Patient agrees to call if any  worsening of symptoms or thoughts of doing harm arise. Verified that the patient has no suicidal ideation at this time.   Complete Medication List: 1)  Synthroid 50 Mcg Tabs (Levothyroxine sodium) .Marland Kitchen.. 1 tab by mouth once daily 2)  Klonopin 1 Mg Tabs (Clonazepam) .... One by mouth at bedtime for restless legs 3)  Cymbalta 30 Mg Cpep (Duloxetine hcl) .... Once daily  Patient Instructions: 1)  Please schedule a follow-up appointment in 2 months. Prescriptions: KLONOPIN 1 MG TABS (CLONAZEPAM) One by mouth at bedtime for restless legs  #30 x 5   Entered and Authorized by:   Etta Grandchild MD   Signed by:   Etta Grandchild MD on 08/15/2009   Method used:   Print then Give to Patient   RxID:   [id number]

## 2010-02-04 NOTE — Progress Notes (Signed)
Summary: Dizziness  Phone Note Call from Patient Call back at Home Phone [phone]   Summary of Call: Patient left message on triage that she was told to call if her sugar got too low, the lowest it has been is 73, but the patient c/o frequent dizzy spells. Please advise. Initial call taken by: Lucious Groves,  February 11, 2009 2:27 PM  Follow-up for Phone Call        this is a normal blood sugar number, and should not be presumed to be the cause of your dizziness.  please see dr Yetta Barre if you wish to have the dizziness checked. Follow-up by: Minus Breeding MD,  February 11, 2009 3:39 PM  Additional Follow-up for Phone Call Additional follow up Details #1::        Number provided by the patient is not a working number. Additional Follow-up by: Lucious Groves,  February 11, 2009 3:41 PM    Additional Follow-up for Phone Call Additional follow up Details #2::    Tried again with 1 and 336 and rec'd busy signal sound.Lucious Groves,  February 11, 2009 4:12 PM  Left vm on [phone] (found in previous phone note) to call office back...........Marland KitchenLamar Sprinkles, CMA  February 11, 2009 5:26 PM   Additional Follow-up for Phone Call Additional follow up Details #3:: Details for Additional Follow-up Action Taken: pt informed and transferred to schedule appt with Dr. Yetta Barre Additional Follow-up by: Margaret Pyle, CMA,  February 12, 2009 8:58 AM

## 2010-02-04 NOTE — Letter (Signed)
Summary: Results Follow-up Letter  [facility]  [address] [place], [place] [address]   Phone: [phone]  Fax: [phone]    08/15/2009  [address] [place], [place]  [address]  Dear Ms. Dobies,   The following are the results of your recent test(s):  Test     Result     Labs       normal  _________________________________________________________  Please call for an appointment as directed _________________________________________________________ _________________________________________________________ _________________________________________________________  Sincerely,  Sanda Linger MD [facility]

## 2010-02-04 NOTE — Letter (Signed)
Summary: Results Follow-up Letter  [facility]  [address] [place], [place] [address]   Phone: [phone]  Fax: [phone]    01/10/2009  [address] [place], [place]  [address]  Dear Stacy [name],   The following are the results of your recent test(s):  Test     Result     Potassium     normal Blood sugar     still low  _________________________________________________________  Please call for an appointment with Dr. Everardo All, Endocrinology, soon to evaluate why your blood sugar is still low _________________________________________________________ _________________________________________________________ _________________________________________________________  Sincerely,  Sanda Linger MD [facility]

## 2010-02-04 NOTE — Assessment & Plan Note (Signed)
Summary: new / jones / [facility] / hypoglycemia / cd   Vital Signs:  Patient profile:   35 year old female Height:      65 inches (165.10 cm) Weight:      145.25 pounds (66.02 kg) O2 Sat:      98 % on Room air Temp:     97.5 degrees F (36.39 degrees C) oral Pulse rate:   88 / minute BP sitting:   112 / 76  (left arm) Cuff size:   large  Vitals Entered By: Josph Macho CMA (January 30, 2009 10:00 AM)  O2 Flow:  Room air CC: New Endo: Hypoglycemia/ CF   Primary Provider:  Yetta Barre  CC:  New Endo: Hypoglycemia/ CF.  History of Present Illness: pt states 6 weeks of intermittent lightheadedness (slightly vertigenous quality).  it was worse with bending over, and improved with eating.  no associated pain in the head.   sxs happen at any time of day,  and is not related to meals.  she has not checked her blood glucose during an episode.  she says she takes no medications to lower her blood glucose.  Current Medications (verified): 1)  Synthroid 50 Mcg Tabs (Levothyroxine Sodium) .Marland Kitchen.. 1 Tab By Mouth Once Daily 2)  Cymbalta 30 Mg Cpep (Duloxetine Hcl) .... Once Daily For Depression  Allergies (verified): No Known Drug Allergies  Past History:  Past Medical History: HYPOGLYCEMIA, UNSPECIFIED (ICD-251.2) HYPOKALEMIA, MILD (ICD-276.8) HYPOTHYROIDISM (ICD-244.9) DEPRESSIVE DISORDER (ICD-311) UNSPECIFIED HYPOTHYROIDISM (ICD-244.9) FAMILY HISTORY DIABETES 1ST DEGREE RELATIVE (ICD-V18.0)  Family History: Reviewed history from 08/03/2008 and no changes required. Family History Diabetes 1st degree relative (mother) Family History High cholesterol Family History Hypertension Family History Thyroid disease  Social History: Reviewed history from 08/03/2008 and no changes required. Married Never Smoked Alcohol use-no Drug use-no Regular exercise-yes does not work outside the home  Review of Systems       denies fever, diarrhea, rash, visual loss, abdominal pain, sob, urinary  frequency, arthralgias, galactorrhea, cramps, excessive diaphoresis, muscle weakness, n/v, and numbness.  she has lost 35 lbs, due to her efforts.  cymbalta helps depression.  she has no menses due to tah.   Physical Exam  Additional Exam:  07/26/08: a1c=5.0   Impression & Recommendations:  Problem # 1:  DIZZINESS (ICD-780.4) uncertain etiology  Problem # 2:  DEPRESSIVE DISORDER (ICD-311) this often limits interpretation of sxs  Problem # 3:  FAMILY HISTORY DIABETES 1ST DEGREE RELATIVE (ICD-V18.0) this increases pt's risk of dm.  if hypoglycemia is present, it is another risk factor for dm.  Other Orders: Consultation Level IV ([address])  Patient Instructions: 1)  cc dr Henderson Cloud 2)  here is a meter to check your blood sugar if the episode recurs. 3)  please call if less than 50. 4)  we discussed the difference between fasting and "postprandial" (after-meal) hypoglycemia.

## 2010-02-04 NOTE — Progress Notes (Signed)
Summary: appt made for 1/27 w/Dr Everardo All   ---- Converted from flag ---- ---- 01/10/2009 8:03 AM, Shelbie Proctor wrote:   ---- 01/10/2009 7:51 AM, Etta Grandchild MD wrote: The following orders have been entered for this patient and placed on Admin Hold:  Type:     Referral       Code:   Endocrine Description:   Endocrinology Referral Order Date:   01/10/2009   Authorized By:   Etta Grandchild MD Order #:   [id number] Clinical Notes: ------------------------------

## 2010-02-04 NOTE — Assessment & Plan Note (Signed)
Summary: FU ON THYROID /NWS  #   Vital Signs:  Patient profile:   35 year old female Menstrual status:  hysterectomy Height:      65 inches Weight:      154 pounds BMI:     25.72 O2 Sat:      99 % on Room air Temp:     97.8 degrees F oral Pulse rate:   75 / minute Pulse rhythm:   regular Resp:     16 per minute BP sitting:   106 / 64  (left arm) Cuff size:   large  Vitals Entered By: Rock Nephew CMA (December 10, 2009 10:11 AM)  O2 Flow:  Room air CC: follow-up visit Is Patient Diabetic? No Pain Assessment Patient in pain? no       Does patient need assistance? Functional Status Self care Ambulation Normal     Menstrual Status hysterectomy   Primary Care Provider:  Yetta Barre  CC:  follow-up visit.  History of Present Illness:  Follow-Up Visit      This is a 35 year old woman who presents for Follow-up visit.  The patient denies chest pain, palpitations, dizziness, syncope, edema, and SOB.  Since the last visit the patient notes no new problems or concerns.  The patient reports taking meds as prescribed.  When questioned about possible medication side effects, the patient notes none.  She is sleeping well.  Preventive Screening-Counseling & Management  Alcohol-Tobacco     Alcohol drinks/day: 0     Alcohol Counseling: not indicated; patient does not drink     Smoking Status: never     Tobacco Counseling: not indicated; no tobacco use  Hep-HIV-STD-Contraception     Hepatitis Risk: no risk noted     HIV Risk: no risk noted     STD Risk: no risk noted  Clinical Review Panels:  Immunizations   Last Tetanus Booster:  Tdap (12/10/2009)   Last Flu Vaccine:  Fluvax 3+ (12/10/2009)  Diabetes Management   Creatinine:  0.6 (08/15/2009)   Last Flu Vaccine:  Fluvax 3+ (12/10/2009)  CBC   WBC:  5.1 (08/15/2009)   RBC:  4.11 (08/15/2009)   Hgb:  13.3 (08/15/2009)   Hct:  38.7 (08/15/2009)   Platelets:  205.0 (08/15/2009)   MCV  94.1 (08/15/2009)   MCHC  34.3  (08/15/2009)   RDW  12.5 (08/15/2009)   PMN:  65.8 (08/15/2009)   Lymphs:  23.6 (08/15/2009)   Monos:  8.7 (08/15/2009)   Eosinophils:  1.2 (08/15/2009)   Basophil:  0.7 (08/15/2009)  Complete Metabolic Panel   Glucose:  67 (08/15/2009)   Sodium:  142 (08/15/2009)   Potassium:  3.9 (08/15/2009)   Chloride:  102 (08/15/2009)   CO2:  33 (08/15/2009)   BUN:  13 (08/15/2009)   Creatinine:  0.6 (08/15/2009)   Albumin:  4.5 (08/15/2009)   Total Protein:  7.1 (08/15/2009)   Calcium:  9.2 (08/15/2009)   Total Bili:  0.6 (08/15/2009)   Alk Phos:  46 (08/15/2009)   SGPT (ALT):  26 (08/15/2009)   SGOT (AST):  24 (08/15/2009)   Medications Prior to Update: 1)  Synthroid 50 Mcg Tabs (Levothyroxine Sodium) .Marland Kitchen.. 1 Tab By Mouth Once Daily 2)  Klonopin 1 Mg Tabs (Clonazepam) .... One By Mouth At Bedtime For Restless Legs 3)  Cymbalta 30 Mg Cpep (Duloxetine Hcl) .... Once Daily  Current Medications (verified): 1)  Synthroid 50 Mcg Tabs (Levothyroxine Sodium) .Marland Kitchen.. 1 Tab By Mouth Once Daily 2)  Klonopin 1 Mg Tabs (Clonazepam) .... One By Mouth At Bedtime For Restless Legs 3)  Cymbalta 30 Mg Cpep (Duloxetine Hcl) .... Once Daily  Allergies (verified): No Known Drug Allergies  Past History:  Past Medical History: Last updated: 01/30/2009 HYPOGLYCEMIA, UNSPECIFIED (ICD-251.2) HYPOKALEMIA, MILD (ICD-276.8) HYPOTHYROIDISM (ICD-244.9) DEPRESSIVE DISORDER (ICD-311) UNSPECIFIED HYPOTHYROIDISM (ICD-244.9) FAMILY HISTORY DIABETES 1ST DEGREE RELATIVE (ICD-V18.0)  Past Surgical History: Last updated: 08/03/2008 Hysterectomy  Family History: Last updated: 01/30/2009 Family History Diabetes 1st degree relative (mother) Family History High cholesterol Family History Hypertension Family History Thyroid disease  Social History: Last updated: 01/30/2009 Married Never Smoked Alcohol use-no Drug use-no Regular exercise-yes does not work outside the home  Risk Factors: Alcohol Use: 0  (12/10/2009) Exercise: yes (08/03/2008)  Risk Factors: Smoking Status: never (12/10/2009)  Family History: Reviewed history from 01/30/2009 and no changes required. Family History Diabetes 1st degree relative (mother) Family History High cholesterol Family History Hypertension Family History Thyroid disease  Social History: Reviewed history from 01/30/2009 and no changes required. Married Never Smoked Alcohol use-no Drug use-no Regular exercise-yes does not work outside the home  Review of Systems  The patient denies anorexia, fever, weight loss, weight gain, chest pain, syncope, dyspnea on exertion, peripheral edema, prolonged cough, headaches, hemoptysis, abdominal pain, hematuria, muscle weakness, suspicious skin lesions, depression, and enlarged lymph nodes.   Endo:  Denies cold intolerance, excessive hunger, excessive thirst, excessive urination, heat intolerance, polyuria, and weight change.  Physical Exam  General:  alert, well-developed, well-nourished, well-hydrated, normal appearance, healthy-appearing, cooperative to examination, and good hygiene.   Head:  normocephalic and atraumatic.   Mouth:  Oral mucosa and oropharynx without lesions or exudates.  Teeth in good repair. Neck:  supple, full ROM, no masses, and no neck tenderness.   Lungs:  Normal respiratory effort, chest expands symmetrically. Lungs are clear to auscultation, no crackles or wheezes. Heart:  Normal rate and regular rhythm. S1 and S2 normal without gallop, murmur, click, rub or other extra sounds. Abdomen:  Bowel sounds positive,abdomen soft and non-tender without masses, organomegaly or hernias noted. Msk:  normal ROM, no joint tenderness, no joint swelling, no joint warmth, no redness over joints, no joint deformities, and no joint instability.   Extremities:  No clubbing, cyanosis, edema, or deformity noted with normal full range of motion of all joints.   Neurologic:  No cranial nerve deficits  noted. Station and gait are normal. Plantar reflexes are down-going bilaterally. DTRs are symmetrical throughout. Sensory, motor and coordinative functions appear intact. Skin:  Intact without suspicious lesions or rashes Cervical Nodes:  No lymphadenopathy noted Psych:  Cognition and judgment appear intact. Alert and cooperative with normal attention span and concentration. No apparent delusions, illusions, hallucinations   Impression & Recommendations:  Problem # 1:  RESTLESS LEG SYNDROME (ICD-333.94) Assessment Improved  Problem # 2:  HYPOTHYROIDISM (ICD-244.9) Assessment: Unchanged  Her updated medication list for this problem includes:    Synthroid 50 Mcg Tabs (Levothyroxine sodium) .Marland Kitchen... 1 tab by mouth once daily  Orders: Venipuncture (24401) TLB-TSH (Thyroid Stimulating Hormone) (84443-TSH)  Labs Reviewed: TSH: 1.29 (08/15/2009)     Problem # 3:  DEPRESSIVE DISORDER (ICD-311) Assessment: Improved  Her updated medication list for this problem includes:    Klonopin 1 Mg Tabs (Clonazepam) ..... One by mouth at bedtime for restless legs    Cymbalta 30 Mg Cpep (Duloxetine hcl) ..... Once daily  Discussed treatment options, including trial of antidpressant medication. Will refer to behavioral health. Follow-up call in in 24-48 hours  and recheck in 2 weeks, sooner as needed. Patient agrees to call if any worsening of symptoms or thoughts of doing harm arise. Verified that the patient has no suicidal ideation at this time.   Complete Medication List: 1)  Synthroid 50 Mcg Tabs (Levothyroxine sodium) .Marland Kitchen.. 1 tab by mouth once daily 2)  Klonopin 1 Mg Tabs (Clonazepam) .... One by mouth at bedtime for restless legs 3)  Cymbalta 30 Mg Cpep (Duloxetine hcl) .... Once daily  Other Orders: Admin 1st Vaccine ([address]) Flu Vaccine 55yrs + ([address]454) Tdap => 13yrs IM (09811) Admin of Any Addtl Vaccine (91478)  Patient Instructions: 1)  Please schedule a follow-up appointment in 4  months.   Orders Added: 1)  Admin 1st Vaccine [90471] 2)  Flu Vaccine 80yrs + [29562] 3)  Tdap => 99yrs IM [90715] 4)  Admin of Any Addtl Vaccine [90472] 5)  Venipuncture [[address]] 6)  TLB-TSH (Thyroid Stimulating Hormone) [84443-TSH] 7)  Est. Patient Level III [57846]  Flu Vaccine Consent Questions     Do you have a history of severe allergic reactions to this vaccine? no    Any prior history of allergic reactions to egg and/or gelatin? no    Do you have a sensitivity to the preservative Thimersol? no    Do you have a past history of Guillan-Barre Syndrome? no    Do you currently have an acute febrile illness? no    Have you ever had a severe reaction to latex? no    Vaccine information given and explained to patient? yes    Are you currently pregnant? no    Lot Number:[id number]   Exp Date:07/05/2010   Site Given  Left Deltoid IM  Immunizations Administered:  Tetanus Vaccine:    Vaccine Type: Tdap    Site: right deltoid    Mfr: GlaxoSmithKline    Dose: 0.5 ml    Route: IM    Given by: Rock Nephew CMA    Exp. Date: 10/25/2011    Lot #: [id number]    VIS given: 11/23/07 version given December 10, 2009.   Immunizations Administered:  Tetanus Vaccine:    Vaccine Type: Tdap    Site: right deltoid    Mfr: GlaxoSmithKline    Dose: 0.5 ml    Route: IM    Given by: Rock Nephew CMA    Exp. Date: 10/25/2011    Lot #: [id number]    VIS given: 11/23/07 version given December 10, 2009.      Marland Kitchenlbflu1

## 2010-02-04 NOTE — Progress Notes (Signed)
Summary: RESULTS  Phone Note Call from Patient Call back at [phone]   Summary of Call: Patient is requesting results of labs.  Initial call taken by: Lamar Sprinkles, CMA,  December 13, 2009 1:50 PM  Follow-up for Phone Call        normal, keep up the good work Follow-up by: Etta Grandchild MD,  December 13, 2009 1:54 PM  Additional Follow-up for Phone Call Additional follow up Details #1::        Pt informed  Additional Follow-up by: Lamar Sprinkles, CMA,  December 13, 2009 2:39 PM

## 2010-02-04 NOTE — Progress Notes (Signed)
  Phone Note Call from Patient Call back at [facility] Phone [phone]   Caller: Patient Call For: Dr Yetta Barre Summary of Call: Pt requesting samples of Cymbalta, 30 mg. Pt sched for ov 5/17 - just needs enough to get her to appt. Initial call taken by: Verdell Face,  May 03, 2009 8:12 AM  Follow-up for Phone Call        ok Follow-up by: Etta Grandchild MD,  May 03, 2009 8:24 AM  Additional Follow-up for Phone Call Additional follow up Details #1::        LMOVM advisng request ready for pick up Additional Follow-up by: Rock Nephew CMA,  May 03, 2009 10:22 AM

## 2010-02-04 NOTE — Progress Notes (Signed)
Summary: SAMPLES  Phone Note Call from Patient Call back at Home Phone [phone] Call back at [phone]   Summary of Call: Patient is requesting samples of cymbalta.  Initial call taken by: Lamar Sprinkles, CMA,  October 29, 2009 11:00 AM  Follow-up for Phone Call        Patient notified and will pick up/ok for mother to pick up per pt.  Cymbalta 30  [id number] A Exp:10/2011

## 2010-02-04 NOTE — Progress Notes (Signed)
Summary: SYNTHROID  Phone Note Call from Patient Call back at Home Phone [phone] Call back at [phone]   Summary of Call: Pt started generic synthroid. Saturday night pt woke up w/migraine & vomiting. She wants to know if MD thinks this was caused from changing from brand to generic? No more vomiting or h/a but does not feel "right" yet. Should she wait to see if it will improve?  Initial call taken by: Lamar Sprinkles, CMA,  November 25, 2009 1:53 PM  Follow-up for Phone Call        i do not think a switch to generic did this Follow-up by: Etta Grandchild MD,  November 25, 2009 1:56 PM  Additional Follow-up for Phone Call Additional follow up Details #1::        Pt informed  Additional Follow-up by: Lamar Sprinkles, CMA,  November 25, 2009 3:57 PM

## 2010-02-04 NOTE — Progress Notes (Signed)
Summary: SAMPLES  Phone Note Call from Patient   Summary of Call: Patient is requesting samples of synthroid 100 or 50 micrograms. Samples she has now are expired.  Initial call taken by: Lamar Sprinkles, CMA,  April 23, 2009 10:10 AM  Follow-up for Phone Call        informed pt and she was informed samples are up front for pick up Follow-up by: Ami Bullins CMA,  April 23, 2009 1:49 PM

## 2010-02-04 NOTE — Progress Notes (Signed)
Summary: CALL  Phone Note Call from Patient   Summary of Call: Pt c/o pain in legs x 1 wk. This past weekend has had increase in pain and trouble sleeping. She has had low potassium in the past.  Initial call taken by: Lamar Sprinkles, CMA,  August 12, 2009 9:41 AM  Follow-up for Phone Call        Spoke w/pt, she is scheduled for office visit this week for eval  Follow-up by: Lamar Sprinkles, CMA,  August 12, 2009 12:02 PM

## 2010-02-04 NOTE — Progress Notes (Signed)
Summary: labs  Phone Note Call from Patient   Caller: Patient Reason for Call: Lab or Test Results Summary of Call: Patient called requesting lab results. Returned call to pt and advised normal per MD..Marland KitchenAlvy Beal Archie CMA  August 16, 2009 3:32 PM

## 2010-02-04 NOTE — Letter (Signed)
Summary: Results Follow-up Letter  [facility]  [address] [place], [place] [address]   Phone: [phone]  Fax: [phone]    05/22/2009  [address] [place], [place]  [address]  Dear Ms. Mccune,   The following are the results of your recent test(s):  Test     Result     Thyroid     normal   _________________________________________________________  Please call for an appointment as directed _________________________________________________________ _________________________________________________________ _________________________________________________________  Sincerely,  Sanda Linger MD [facility]

## 2010-02-04 NOTE — Progress Notes (Signed)
Summary: Rx change/ Stacy [name] pt  Phone Note Other Incoming   Caller: pt  Summary of Call: Pt cannot afford the Venlafaxine that Dr Stacy [name] just put her on. Please Advise another alternative Initial call taken by: Ami Bullins CMA,  May 23, 2009 3:36 PM  Follow-up for Phone Call        please try generic fluoxetine 20 mg per day;  f/u 3 wks with dr Stacy [name] if does not already have appt Follow-up by: Corwin Levins MD,  May 23, 2009 5:09 PM  Additional Follow-up for Phone Call Additional follow up Details #1::        pt informed and will sch f/u appt with Dr. Yetta [name] Rx faxed to pharmacy per pt request Additional Follow-up by: Margaret Pyle, CMA,  May 24, 2009 10:13 AM    New/Updated Medications: FLUOXETINE HCL 20 MG CAPS (FLUOXETINE HCL) 1 by mouth once daily Prescriptions: FLUOXETINE HCL 20 MG CAPS (FLUOXETINE HCL) 1 by mouth once daily  #90 x 3   Entered and Authorized by:   Corwin Levins MD   Signed by:   Corwin Levins MD on 05/23/2009   Method used:   Print then Give to Patient   RxID:   [id number]  done hardcopy to LIM side B - dahlia  Corwin Levins MD  May 23, 2009 5:10 PM

## 2010-02-06 NOTE — Progress Notes (Signed)
Summary: SAMPLES  Phone Note Call from Patient Call back at Home Phone [phone]   Summary of Call: Patient is requesting samples of cymbalta.  Initial call taken by: Lamar Sprinkles, CMA,  January 27, 2010 2:36 PM  Follow-up for Phone Call        Samples ready - left vm for pt  Follow-up by: Lamar Sprinkles, CMA,  January 27, 2010 5:29 PM    Prescriptions: CYMBALTA 30 MG CPEP (DULOXETINE HCL) once daily  #42 x 0   Entered by:   Lamar Sprinkles, CMA   Authorized by:   Etta Grandchild MD   Signed by:   Lamar Sprinkles, CMA on 01/27/2010   Method used:   Samples Given   RxID:   [id number]

## 2010-02-19 ENCOUNTER — Telehealth: Payer: Self-pay | Admitting: Internal Medicine

## 2010-02-20 ENCOUNTER — Ambulatory Visit (INDEPENDENT_AMBULATORY_CARE_PROVIDER_SITE_OTHER): Payer: PRIVATE HEALTH INSURANCE | Admitting: Internal Medicine

## 2010-02-20 ENCOUNTER — Encounter: Payer: Self-pay | Admitting: Internal Medicine

## 2010-02-20 ENCOUNTER — Other Ambulatory Visit: Payer: Self-pay | Admitting: Internal Medicine

## 2010-02-20 ENCOUNTER — Other Ambulatory Visit: Payer: PRIVATE HEALTH INSURANCE

## 2010-02-20 DIAGNOSIS — D72819 Decreased white blood cell count, unspecified: Secondary | ICD-10-CM | POA: Insufficient documentation

## 2010-02-20 DIAGNOSIS — R110 Nausea: Secondary | ICD-10-CM

## 2010-02-20 LAB — HEPATIC FUNCTION PANEL
ALT: 20 U/L (ref 0–35)
AST: 23 U/L (ref 0–37)
Albumin: 3.8 g/dL (ref 3.5–5.2)
Alkaline Phosphatase: 50 U/L (ref 39–117)
Total Protein: 6.5 g/dL (ref 6.0–8.3)

## 2010-02-20 LAB — CBC WITH DIFFERENTIAL/PLATELET
Basophils Absolute: 0 10*3/uL (ref 0.0–0.1)
Basophils Relative: 0.7 % (ref 0.0–3.0)
Eosinophils Absolute: 0 10*3/uL (ref 0.0–0.7)
Eosinophils Relative: 2.1 % (ref 0.0–5.0)
Hemoglobin: 12.3 g/dL (ref 12.0–15.0)
Lymphocytes Relative: 46.6 % — ABNORMAL HIGH (ref 12.0–46.0)
Lymphs Abs: 1.1 10*3/uL (ref 0.7–4.0)
MCHC: 33.9 g/dL (ref 30.0–36.0)
MCV: 92.7 fl (ref 78.0–100.0)
Monocytes Absolute: 0.3 10*3/uL (ref 0.1–1.0)
Monocytes Relative: 14.8 % — ABNORMAL HIGH (ref 3.0–12.0)
Neutro Abs: 0.8 10*3/uL — ABNORMAL LOW (ref 1.4–7.7)
Platelets: 174 10*3/uL (ref 150.0–400.0)
RBC: 3.92 Mil/uL (ref 3.87–5.11)
RDW: 12.7 % (ref 11.5–14.6)
WBC: 2.3 10*3/uL — ABNORMAL LOW (ref 4.5–10.5)

## 2010-02-20 LAB — BASIC METABOLIC PANEL
BUN: 12 mg/dL (ref 6–23)
Calcium: 8.9 mg/dL (ref 8.4–10.5)
Chloride: 105 mEq/L (ref 96–112)
Creatinine, Ser: 0.6 mg/dL (ref 0.4–1.2)
GFR: 116.13 mL/min (ref >60.00)

## 2010-02-20 LAB — TSH: TSH: 1.72 u[IU]/mL (ref 0.35–5.50)

## 2010-02-20 LAB — URINALYSIS, ROUTINE W REFLEX MICROSCOPIC
Bilirubin Urine: NEGATIVE
Hgb urine dipstick: NEGATIVE
Ketones, ur: NEGATIVE
Leukocytes, UA: NEGATIVE
Nitrite: NEGATIVE
Urobilinogen, UA: 0.2 (ref 0.0–1.0)

## 2010-02-20 LAB — LIPASE: Lipase: 27 U/L (ref 11.0–59.0)

## 2010-02-21 ENCOUNTER — Other Ambulatory Visit: Payer: Self-pay | Admitting: Internal Medicine

## 2010-02-21 DIAGNOSIS — R110 Nausea: Secondary | ICD-10-CM

## 2010-02-24 ENCOUNTER — Other Ambulatory Visit: Payer: Self-pay | Admitting: Internal Medicine

## 2010-02-24 DIAGNOSIS — R110 Nausea: Secondary | ICD-10-CM

## 2010-02-25 ENCOUNTER — Other Ambulatory Visit: Payer: PRIVATE HEALTH INSURANCE

## 2010-02-26 ENCOUNTER — Other Ambulatory Visit: Payer: PRIVATE HEALTH INSURANCE

## 2010-02-26 NOTE — Assessment & Plan Note (Signed)
Summary: nausea,stomach pain on left side/jones/lb   Vital Signs:  Patient profile:   35 year old female Menstrual status:  hysterectomy Height:      65 inches (165.10 cm) O2 Sat:      99 % on Room air Temp:     97.7 degrees F (36.50 degrees C) oral Pulse rate:   77 / minute BP sitting:   102 / 80  (left arm) Cuff size:   regular  Vitals Entered By: Orlan Leavens RMA (February 20, 2010 10:11 AM)  O2 Flow:  Room air CC: Pain on (L) side stomach & nausea, Abdominal pain Is Patient Diabetic? No Pain Assessment Patient in pain? yes     Location: stomach   Primary Care Provider:  Yetta Barre  CC:  Pain on (L) side stomach & nausea and Abdominal pain.  History of Present Illness:  Abdominal Pain      This is a 35 year old woman who presents with Abdominal pain.  The symptoms began 5 days ago.  On a scale of 1 to 10, the intensity is described as a 4.  The patient reports nausea, but denies vomiting, diarrhea, constipation, hematochezia, anorexia, and hematemesis.  The location of the pain is left upper quadrant.  The pain is described as intermittent and dull.  The patient denies the following symptoms: fever, weight loss, dysuria, chest pain, dark urine, missed menstrual period, and vaginal bleeding.  The pain is worse with jarring of the abdomen.  The pain is better with rest.    Clinical Review Panels:  CBC   WBC:  5.1 (08/15/2009)   RBC:  4.11 (08/15/2009)   Hgb:  13.3 (08/15/2009)   Hct:  38.7 (08/15/2009)   Platelets:  205.0 (08/15/2009)   MCV  94.1 (08/15/2009)   MCHC  34.3 (08/15/2009)   RDW  12.5 (08/15/2009)   PMN:  65.8 (08/15/2009)   Lymphs:  23.6 (08/15/2009)   Monos:  8.7 (08/15/2009)   Eosinophils:  1.2 (08/15/2009)   Basophil:  0.7 (08/15/2009)  Complete Metabolic Panel   Glucose:  67 (08/15/2009)   Sodium:  142 (08/15/2009)   Potassium:  3.9 (08/15/2009)   Chloride:  102 (08/15/2009)   CO2:  33 (08/15/2009)   BUN:  13 (08/15/2009)   Creatinine:  0.6  (08/15/2009)   Albumin:  4.5 (08/15/2009)   Total Protein:  7.1 (08/15/2009)   Calcium:  9.2 (08/15/2009)   Total Bili:  0.6 (08/15/2009)   Alk Phos:  46 (08/15/2009)   SGPT (ALT):  26 (08/15/2009)   SGOT (AST):  24 (08/15/2009)   Current Medications (verified): 1)  Synthroid 50 Mcg Tabs (Levothyroxine Sodium) .Marland Kitchen.. 1 Tab By Mouth Once Daily 2)  Klonopin 1 Mg Tabs (Clonazepam) .... One By Mouth At Bedtime For Restless Legs 3)  Cymbalta 30 Mg Cpep (Duloxetine Hcl) .... Once Daily  Allergies (verified): No Known Drug Allergies  Past History:  Past Medical History: HYPOGLYCEMIA, UNSPECIFIED  HYPOKALEMIA, MILD  HYPOTHYROIDISM  DEPRESSIVE DISORDER  Review of Systems  The patient denies weight loss, peripheral edema, headaches, melena, hematochezia, and severe indigestion/heartburn.    Physical Exam  General:  alert, well-developed, well-nourished, and cooperative to examination.   nontoxic Lungs:  normal respiratory effort, no intercostal retractions or use of accessory muscles; normal breath sounds bilaterally - no crackles and no wheezes.    Heart:  normal rate, regular rhythm, no murmur, and no rub. BLE without edema. Abdomen:  soft, non-tender, normal bowel sounds, no distention; no masses and  no appreciable hepatomegaly or splenomegaly.     Impression & Recommendations:  Problem # 1:  NAUSEA (ICD-787.02)  exam benign - hx nonspecific - no nsaids or EtOH, no med changes or travel s/p hyst -- no pregnancy check labs and abd [place] symptomatic tx with promethazine - erx done Orders: TLB-CBC Platelet - w/Differential (85025-CBCD) TLB-Hepatic/Liver Function Pnl (80076-HEPATIC) TLB-TSH (Thyroid Stimulating Hormone) (84443-TSH) TLB-BMP (Basic Metabolic Panel-BMET) (80048-METABOL) TLB-Lipase (83690-LIPASE) TLB-Udip w/ Micro (81001-URINE) Radiology Referral (Radiology) Prescription Created Electronically [phone])  Discussed symptom control.   Complete Medication List: 1)   Synthroid 50 Mcg Tabs (Levothyroxine sodium) .Marland Kitchen.. 1 tab by mouth once daily 2)  Klonopin 1 Mg Tabs (Clonazepam) .... One by mouth at bedtime for restless legs 3)  Cymbalta 30 Mg Cpep (Duloxetine hcl) .... Once daily 4)  Promethazine Hcl 25 Mg Tabs (Promethazine hcl) .Marland Kitchen.. 1 by mouth every 4 hours as needed for nausea symptoms  Patient Instructions: 1)  it was good to see you today. 2)  test(s) ordered today - your results will be called to you after review in 48-72 hours from the time of test completion; if any changes need to be made or there are abnormal results, you will be notified at that time 3)  we'll make referral for ultrasound. Our office will contact you regarding this appointment once made.  4)  Please schedule an appointment with your primary doctor in : 3-4 weeks to review symptoms as needed  Prescriptions: PROMETHAZINE HCL 25 MG TABS (PROMETHAZINE HCL) 1 by mouth every 4 hours as needed for nausea symptoms  #30 x 0   Entered and Authorized by:   Newt Lukes MD   Signed by:   Newt Lukes MD on 02/20/2010   Method used:   Electronically to        [facility] (retail)       [address]       [place], [place]  [address]       Ph: [phone]       Fax: [phone]   RxID:   [id number]    Orders Added: 1)  TLB-CBC Platelet - w/Differential [85025-CBCD] 2)  TLB-Hepatic/Liver Function Pnl [80076-HEPATIC] 3)  TLB-TSH (Thyroid Stimulating Hormone) [84443-TSH] 4)  TLB-BMP (Basic Metabolic Panel-BMET) [80048-METABOL] 5)  TLB-Lipase [83690-LIPASE] 6)  TLB-Udip w/ Micro [81001-URINE] 7)  Radiology Referral [Radiology] 8)  Est. Patient Level IV [44010] 9)  Prescription Created Electronically [phone]

## 2010-02-26 NOTE — Progress Notes (Signed)
Summary: appt  Phone Note Call from Patient Call back at [phone]   Summary of Call: Pt c/o nausea since sat night. No vomiting. She has tried pepto w/no relief. Also has abd discomfort. No change w/amount of food. No fever.  Initial call taken by: Lamar Sprinkles, CMA,  February 19, 2010 12:09 PM  Follow-up for Phone Call        needs to be seen Follow-up by: Etta Grandchild MD,  February 19, 2010 1:54 PM  Additional Follow-up for Phone Call Additional follow up Details #1::        Will notify patient to set up and an appt.Alvy Beal Archie CMA  February 19, 2010 2:00 PM  sceduled patient for 10 am tomorrow with Dr. Felicity Coyer Additional Follow-up by: Daphane Shepherd,  February 19, 2010 3:31 PM

## 2010-02-28 ENCOUNTER — Other Ambulatory Visit: Payer: Self-pay | Admitting: Internal Medicine

## 2010-02-28 ENCOUNTER — Ambulatory Visit: Payer: PRIVATE HEALTH INSURANCE | Admitting: Internal Medicine

## 2010-02-28 ENCOUNTER — Ambulatory Visit (HOSPITAL_COMMUNITY)
Admission: RE | Admit: 2010-02-28 | Discharge: 2010-02-28 | Disposition: A | Discharge status: Home / Self Care | Payer: PRIVATE HEALTH INSURANCE | Source: Ambulatory Visit | Attending: Internal Medicine | Admitting: Internal Medicine

## 2010-02-28 ENCOUNTER — Encounter (INDEPENDENT_AMBULATORY_CARE_PROVIDER_SITE_OTHER): Payer: Self-pay | Admitting: *Deleted

## 2010-02-28 ENCOUNTER — Inpatient Hospital Stay (HOSPITAL_COMMUNITY): Admission: RE | Admit: 2010-02-28 | Payer: PRIVATE HEALTH INSURANCE | Source: Ambulatory Visit

## 2010-02-28 ENCOUNTER — Other Ambulatory Visit: Payer: PRIVATE HEALTH INSURANCE

## 2010-02-28 DIAGNOSIS — R1013 Epigastric pain: Secondary | ICD-10-CM | POA: Insufficient documentation

## 2010-02-28 DIAGNOSIS — D72819 Decreased white blood cell count, unspecified: Secondary | ICD-10-CM

## 2010-02-28 DIAGNOSIS — R1012 Left upper quadrant pain: Secondary | ICD-10-CM | POA: Insufficient documentation

## 2010-02-28 DIAGNOSIS — R110 Nausea: Secondary | ICD-10-CM | POA: Insufficient documentation

## 2010-02-28 LAB — CBC WITH DIFFERENTIAL/PLATELET
Basophils Absolute: 0 10*3/uL (ref 0.0–0.1)
Basophils Relative: 0.6 % (ref 0.0–3.0)
Eosinophils Absolute: 0.1 10*3/uL (ref 0.0–0.7)
Lymphocytes Relative: 32.6 % (ref 12.0–46.0)
MCHC: 34.9 g/dL (ref 30.0–36.0)
MCV: 92.3 fl (ref 78.0–100.0)
Monocytes Absolute: 0.4 10*3/uL (ref 0.1–1.0)
Neutro Abs: 2.4 10*3/uL (ref 1.4–7.7)
Neutrophils Relative %: 56.1 % (ref 43.0–77.0)
RBC: 3.88 Mil/uL (ref 3.87–5.11)
RDW: 12.5 % (ref 11.5–14.6)

## 2010-03-03 ENCOUNTER — Telehealth: Payer: Self-pay | Admitting: Internal Medicine

## 2010-03-03 ENCOUNTER — Ambulatory Visit: Payer: PRIVATE HEALTH INSURANCE | Admitting: Internal Medicine

## 2010-03-11 ENCOUNTER — Telehealth: Payer: Self-pay | Admitting: Internal Medicine

## 2010-03-13 NOTE — Progress Notes (Signed)
Summary: RESULTS  Phone Note Call from Patient Call back at Work Phone [phone]   Summary of Call: Pt left vm - had to cx apt today b/c has 2 children w/stomach virus. Pt is feeling better and wants to know results of u/s and labs.  Initial call taken by: Lamar Sprinkles, CMA,  March 03, 2010 9:20 AM  Follow-up for Phone Call        please run this by Dr. Felicity Coyer Follow-up by: Etta Grandchild MD,  March 03, 2010 9:26 AM  Additional Follow-up for Phone Call Additional follow up Details #1::        utrasound normal and repeat labs improved - wbc near normal - f/u PMD (Lynzee Lindquist) as needed  Additional Follow-up by: Newt Lukes MD,  March 03, 2010 11:13 AM    Additional Follow-up for Phone Call Additional follow up Details #2::    Pt informed  Follow-up by: Lamar Sprinkles, CMA,  March 03, 2010 12:47 PM

## 2010-03-18 NOTE — Progress Notes (Signed)
Summary: refill  Phone Note Refill Request Message from:  Pharmacy on March 11, 2010 8:58 AM  Refills Requested: Medication #1:  KLONOPIN 1 MG TABS One by mouth at bedtime for restless legs   Dosage confirmed as above?Dosage Confirmed   Last Refilled: 08/15/2009 Is this ok to refill?  Initial call taken by: Rock Nephew CMA,  March 11, 2010 8:58 AM  Follow-up for Phone Call        yes Follow-up by: Etta Grandchild MD,  March 11, 2010 9:08 AM    Prescriptions: Scarlette Calico 1 MG TABS (CLONAZEPAM) One by mouth at bedtime for restless legs  #30 x 5   Entered by:   Rock Nephew CMA   Authorized by:   Etta Grandchild MD   Signed by:   Rock Nephew CMA on 03/11/2010   Method used:   Telephoned to ...       [facility] [address] (retail)       [address]       [place], [place]  [address]       Ph: [phone]       Fax: [phone]   RxID:   [id number]

## 2010-03-21 ENCOUNTER — Telehealth: Payer: Self-pay | Admitting: Internal Medicine

## 2010-03-25 NOTE — Progress Notes (Signed)
Summary: Cymbala samples  Phone Note Call from Patient Call back at Home Phone [phone] Call back at Work Phone [phone]   Summary of Call: Patient is requesting samples of cymbalta 30mg .  Initial call taken by: Lamar Sprinkles, CMA,  March 21, 2010 12:30 PM  Follow-up for Phone Call        Pt informed  Follow-up by: Lamar Sprinkles, CMA,  March 21, 2010 3:52 PM    Prescriptions: CYMBALTA 30 MG CPEP (DULOXETINE HCL) once daily  #14 x 0   Entered by:   Lamar Sprinkles, CMA   Authorized by:   Etta Grandchild MD   Signed by:   Lamar Sprinkles, CMA on 03/21/2010   Method used:   Samples Given   RxID:   [id number]

## 2010-04-03 ENCOUNTER — Telehealth: Payer: Self-pay | Admitting: *Deleted

## 2010-04-03 NOTE — Telephone Encounter (Signed)
Patient notified and will pick up 

## 2010-04-03 NOTE — Telephone Encounter (Signed)
Patient requesting samples of cymbalta.

## 2010-05-16 ENCOUNTER — Other Ambulatory Visit: Payer: Self-pay | Admitting: *Deleted

## 2010-05-16 MED ORDER — LEVOTHYROXINE SODIUM 50 MCG PO TABS: 50.0000 ug | ORAL_TABLET | Freq: Every day | ORAL | Status: DC

## 2010-05-23 NOTE — H&P (Signed)
NAMELATARRA, EAGLETON            ACCOUNT NO.:  [id number]   MEDICAL RECORD NO.:  [id number]          PATIENT TYPE:  OBV   LOCATION:  NA                            FACILITY:  [facility]   PHYSICIAN:  Guy Sandifer. Tomblin II, M.D.DATE OF BIRTH:  Feb 23, 1975   DATE OF ADMISSION:  DATE OF DISCHARGE:                                HISTORY & PHYSICAL   CHIEF COMPLAINT:  Heavy, painful vaginal bleeding.   HISTORY OF PRESENT ILLNESS:  This patient is a 35 year old, married, white  female, G2, P2, status post left salpingo-oophorectomy and right  salpingectomy who has known endometriosis.  She has continuing heavy menses  with large blood clots.  She also has increasing pelvic pain.  She is status  post a Depo-Lupron and found the birth control pill of no help.  After a  discussion of the options, she is being admitted for a laparoscopically-  assisted vaginal hysterectomy.  The potential risks and complications had  been reviewed with the patient preoperatively.   PAST MEDICAL HISTORY:  Seizure at age 36 and 35 years old.  No  medications and no further seizure activity.   PAST SURGICAL HISTORY:  1.  Exploratory laparotomy during pregnancy in 1995 with a right partial      salpingectomy for torsion of the right fallopian tube.  2.  Laparoscopy with left salpingo-oophorectomy, ablation of endometriosis,      lysis of adhesions, and hysteroscopy, D&C in 2004.   OBSTETRIC HISTORY:  Vaginal delivery x2.   FAMILY HISTORY:  Chronic hypertension maternal grandmother with diabetes  maternal uncle, bladder cancer in father.   SOCIAL HISTORY:  The patient denies tobacco or alcohol or drug abuse.   MEDICATIONS:  Vitamins.   ALLERGIES:  No known drug allergies.   REVIEW OF SYSTEMS:  Neurologic:  She denies headache.  History of seizure  activity as above.  Pulmonary:  Denies shortness of breath.  Cardiac:  Denies chest pain.  GI:  Denies recent changes in bowel habits.   PHYSICAL EXAMINATION:   Height 5 feet 6 inches, weight 182 pounds, blood  pressure 120/82.  HEENT:  Without thyromegaly.  Lungs:  Clear to  auscultation.  Heart:  Regular rate and rhythm.  Back:  Without CVA  tenderness.  Breasts:  Without masses, retraction or discharge.  Abdomen:  Soft, non-tender without masses.  Pelvic exam:  Vulva, vagina, cervix  without lesion.  Uterus is normal size, mobile, and non-tender.  Adnexa non-  tender without masses.  Extremities and neurological exam grossly within  normal limits.   ASSESSMENT:  Menometrorrhagia and dysmenorrhea.   PLAN:  Laparoscopically-assisted vaginal hysterectomy.      JET/MEDQ  D:  03/24/2004  T:  03/24/2004  Job:  [id number]

## 2010-05-23 NOTE — Discharge Summary (Signed)
[name], [name]            ACCOUNT NO.:  [id number]   MEDICAL RECORD NO.:  [id number]          PATIENT TYPE:  OBV   LOCATION:  9302                          [facility]ACILITY:  [facility]   PHYSICIAN:  Guy Sandifer. Tomblin II, M.D.DATE OF BIRTH:  1975/04/17   DATE OF ADMISSION:  03/26/2004  DATE OF DISCHARGE:                                 DISCHARGE SUMMARY   ADMITTING DIAGNOSES:  1.  Menometrorrhagia.  2.  Dysmenorrhea.   DISCHARGE DIAGNOSES:  1.  Menometrorrhagia.  2.  Endometriosis.   PROCEDURE:  On March 26, 2004 - laparoscopically-assisted vaginal  hysterectomy and ablation of endometriosis.   REASON FOR ADMISSION:  The patient is a 35 year old married white female G2,  P2, status post LSO, right salpingectomy, with known endometriosis who has  increasing pain and heavy bleeding. Details are dictated in the history and  physical. She is admitted for surgical management.   HOSPITAL COURSE:  The patient is admitted to the hospital and undergoes the  above procedure. On the evening of surgery she has good pain relief, stable  vital signs, and good urine output. On the day of discharge she states that  she rested well. She is passing flatus, voiding, and tolerating regular  diet. Vital signs are stable, she is afebrile. Abdomen is soft with good  bowel sounds. White count 4.0, hemoglobin is 9.1. Pathology is pending.   CONDITION ON DISCHARGE:  Good.   DIET:  Regular as tolerated.   ACTIVITY:  No lifting, no operation of automobiles, no vaginal entry. She is  to call the office for problems including but not limited to temperature of  101 degrees, increasing pain, persistent nausea/vomiting, or heavy vaginal  bleeding.   MEDICATIONS:  1.  Percocet 5/325 mg #30 one to two p.o. q.6 h p.r.n.  2.  Hemocyte one p.o. daily.  3.  Multivitamin daily.   Follow-up is in the office in 2 weeks.      JET/MEDQ  D:  03/27/2004  T:  03/27/2004  Job:  [id number]

## 2010-05-23 NOTE — Op Note (Signed)
Stacy [name], Stacy [name]            ACCOUNT NO.:  [id number]   MEDICAL RECORD NO.:  [id number]          PATIENT TYPE:  OBV   LOCATION:  9399                          [facility]ACILITY:  [facility]   PHYSICIAN:  Guy Sandifer. Tomblin II, M.D.DATE OF BIRTH:  1975-09-14   DATE OF PROCEDURE:  03/26/2004  DATE OF DISCHARGE:                                 OPERATIVE REPORT   PREOPERATIVE DIAGNOSES:  1.  Menometrorrhagia.  2.  Dysmenorrhea.   POSTOPERATIVE DIAGNOSES:  1.  Menometrorrhagia.  2.  Endometriosis.   PROCEDURE:  Laparoscopically assisted vaginal hysterectomy and ablation of  endometriosis.   SURGEON:  Guy Sandifer. Henderson Cloud, M.D.   ASSISTANTFreddy Finner, M.D.   ANESTHESIA:  General with endotracheal intubation.   ESTIMATED BLOOD LOSS:  Less than 100 mL.   INDICATIONS AND CONSENT:  The patient is a 35 year old married, white  female, G2, P2, status post LSO, status post right salpingectomy with known  endometriosis.  She has continued heavy bleeding and pelvic pain.  Details  are dictated in the History and Physical.  Laparoscopically-assisted vaginal  hysterectomy has been discussed with the patient preoperatively.  Potential  risks, complications were discussed and included but are not limited to  infection, bowel, bladder, ureteral damage, bleeding requiring transfusion  of blood products and possible transfusion reaction, HIV and hepatitis  acquisition, DVT, PE, pneumonia, fistula formation, postoperative  dyspareunia, recurrent pelvic pain, and laparotomy.  All questions were  answered and consent was signed on the chart.   FINDINGS:  Upper abdomen is grossly normal.  Uterus is about in 6 weeks in  size.  Anterior cul-de-sac contains a powder burn-type implant of  endometriosis on the left vesicouterine pole.  Left tube and ovary are gone.  Right tube is status post distal salpingectomy.  Right ovary is normal.  Posterior cul-de-sac is normal.   PROCEDURE IN DETAIL:  The patient was  taken to the operating room where she  was identified and placed in the dorsal supine position and general  anesthesia is induced via endotracheal intubation.  She was then placed in  the dorsal lithotomy position where she was prepped abdominally and  vaginally, bladder straight catheterized.  Hulka tenaculum was placed in the  uterus as a manipulator and she was draped in a sterile fashion.  An  infraumbilical incision is made and dissection is carried out in layers to  the fascia which is incised and the anterior leaf of the fascia is anchored  at each angle with 0 Vicryl suture.  Continued dissection enters the  peritoneal cavity without difficulty.  The disposable open laparoscopic  trocar sleeve is placed and anchored with the sutures.  Pneumoperitoneum is  induced and inspection revealed the above findings.  There is no damage to  surrounding structures noted.  Small suprapubic incision is made and the 5  mm disposable trocar sleeve is placed under direct visualization without  difficulty.  The above findings were noted.  The course of the ureters is  identified bilaterally.  Then using the gyrus bipolar cautery cutting  instrument, the  proximal ligaments on the right are  taken down to the level  of the vesicouterine peritoneum.  A similar procedure is carried out on the  left across the round ligament down to the vesicouterine peritoneum.  The  vesicouterine peritoneum is incised in the midline, dissected bilaterally.  Suprapubic trocar sleeve is removed and attention is turned to the vagina.   The posterior cul-de-sac is entered sharply and the cervix is circumscribed  with cautery.  The mucosa is advanced sharply and bluntly.  Then, using the  Gyrus bipolar instrument, the uterosacral ligaments were taken down followed  by the bladder pillars and the cardinal ligaments and the uterine vessels  bilaterally.  The anterior cul-de-sac is entered without difficulty.  The  fundus is  delivered posteriorly.  The proximal ligaments are taken down.  The specimen is delivered.  The uterosacral ligaments were anchored to the  vaginal cuff bilaterally with 0 Monocryl suture.  All sutures will be 0  Monocryl unless otherwise designated.  The uterosacral ligaments were then  plicated in the midline with a third suture.  The cuff is closed with figure-  of-eights.  The Foley catheter is placed in the vagina and clear urine is  noted.   Attention is returned to the abdomen.  Careful inspection and irrigation  reveals minor bleeding in peritoneal edges which is controlled with bipolar  cautery using the Gyrus.  Repeated inspection under reduced pneumoperitoneum  revealed good hemostasis.  Suprapubic trocar sleeve is removed,  pneumoperitoneum is reduced.  All instruments are removed.  The umbilical  incision is closed by tying the two anchoring sutures of 0 Vicryl together  with care being taken not to pick up any underlying structure.  Both  incisions are injected with 0.5% plain Marcaine.  The umbilical incision is  closed with subcuticular as well as interrupted 3-0 Vicryl suture.  Dermabond is placed on both incisions.  All counts were correct.  The  patient was awakened and taken to recovery room in stable condition.      JET/MEDQ  D:  03/26/2004  T:  03/26/2004  Job:  [id number]

## 2010-05-23 NOTE — Op Note (Signed)
NAME:  Stacy [name], Stacy [name]                      ACCOUNT NO.:  [id number]   MEDICAL RECORD NO.:  [id number]                   PATIENT TYPE:  AMB   LOCATION:  [facility]                                  FACILITY:  [facility]   PHYSICIAN:  Guy Sandifer. Arleta Creek, M.D.           DATE OF BIRTH:  1975/11/21   DATE OF PROCEDURE:  02/27/2002  DATE OF DISCHARGE:                                 OPERATIVE REPORT   PREOPERATIVE DIAGNOSES:  1. Left lower quadrant pain.  2. Left ovarian cyst.  3. Desires permanent sterilization.  4. Menorrhagia.   POSTOPERATIVE DIAGNOSES:  1. Severe endometriosis.  2. Left ovarian cyst.  3. Menorrhagia.   PROCEDURE:  1. Laparoscopy with left salpingo-oophorectomy, ablation of endometriosis,     lysis of adhesions.  2. Hysteroscopy with dilatation and curettage.   SURGEON:  Guy Sandifer. Henderson Cloud, M.D.   ANESTHESIA:  General with endotracheal intubation.   ESTIMATED BLOOD LOSS:  Less than 50 mL.   INTAKE AND OUTPUT:  Sorbitol distending media 20 mL deficit.   SPECIMENS:  1. Left ovary and tube.  2. Aspirate of left ovarian cyst.  3. Endometrial curettings.   INDICATIONS AND CONSENT:  This patient is a 35 year old married white female  G2, P2 status post right salpingectomy with increasingly severe left lower  quadrant pain and menorrhagia.  Details are dictated in the history and  physical.  The patient desires permanent sterilization.  Alternate methods  of contraception have been discussed.  Open laparoscopy, possible left  ovarian cystectomy, possible left salpingo-oophorectomy, tubal ligation,  hysteroscopy, D&C has been discussed with the patient.  Potential risks and  complications have been discussed preoperatively including, but not limited  to, infection, bowel, bladder, ureteral damage, bleeding requiring  transfusion of blood products with possible transfusion reaction, HIV and  hepatitis acquisition, DVT, PE, recurrent pelvic pain.  Permanence of tubal  ligation, failure rate, and increased ectopic rate have also been discussed.  All questions are answered and consent is signed on the chart.   FINDINGS:  Endometrial cavity is normal in appearance.  Both fallopian tube  ostia are identified.  Abdominally upper abdomen is grossly normal.  Appendix is normal.  Uterus is normal in size.  The right ovary is adherent  to the right pelvic side wall at its distal pole.  It is also adherent to  the bowel with multiple long adhesions.  The ovary is otherwise normal in  appearance.  The right fallopian tube is status post salpingectomy with the  distal one-third of the tube removed and the remaining end of the tube  clearly obstructed.  The left ovary has about a 4 cm cyst that is smooth  consistent with an endometrioma.  There are multiple implants of  endometriosis on the left pelvic side wall.  There are two implants of  endometriosis on the bladder flap.  There is a dark black implant of  endometriosis on the left  abdominal side wall just above the level of the  pelvic brim.   PROCEDURE:  After discussing the procedure with the patient and identifying  her left lower quadrant as the possible ovary to be removed, the patient was  placed in the dorsal supine position where general anesthesia was induced  via endotracheal intubation.  She was then placed in the dorsal lithotomy  position where she was prepped abdominally and vaginally, bladder straight  catheterized, and she is draped in a sterile fashion.  Time out is  undertaken with the patient and procedures are correctly identified.  Bivalve speculum was placed in the vagina.  The anterior cervical lip is  injected with 1% Xylocaine plain and then grasped with single tooth  tenaculum.  Paracervical block at the 2, 4, 5, 7, 8, and 10 o'clock  positions with approximately 20 mL total of Xylocaine was then done.  Cervix  was gently progressively dilated to a 27 Pratt dilator.  Diagnostic   hysteroscope is placed in the endocervical canal and advanced under direct  visualization using sorbitol distending media.  The above findings were  noted.  Hysteroscope was withdrawn and sharp curettage is carried out.  The  single tooth tenaculum was replaced with a Hulka tenaculum and attention is  turned to the abdomen.  An intraumbilical incision in a vertical manner is  carried out and dissection is carried out in layers to the peritoneum  without difficulty.  On the way in the external fascial sheath is identified  and anchoring sutures of 0 Vicryl are placed at each angle bilaterally.  Peritoneum is entered without difficulty.  The disposable Hasson trocar  sleeve is placed and tied down with the anchoring sutures.  Pneumoperitoneum  is induced.  No damage to surrounding structures is noted.  A small  suprapubic incision is made and a 5 mm nondisposable trocar sleeve was  placed under direct visualization without difficulty.  The above findings  are noted.  The right ovary is released from its adhesions to the bowel.  These adhesions were quite stretched out and this is done well clear of any  area of bowel.  Bipolar cautery is used at the point of insertion of the  adhesions to the ovary to obtain complete hemostasis.  A small incision is  made in the peritoneum on the pelvic side wall just above the point of  adhesion of the right ovary and hydrodissection is carried out.  This  mobilizes the peritoneum.  The ovary is then cut free of its adhesions to  the right pelvic side wall.  Hemostasis is obtained with bipolar cautery.  At this point the ovary appears otherwise normal, has a good vascular supply  of the infundibulopelvic ligament.  After noting the above findings the  decision was made to remove the left tube and ovary.  The left ureter can be  identified and is seen to be well clear of the left tube and ovary.  Then, using the gyrus cautery cutting 5 mm instrument through  the operative  laparoscope, the infundibulopelvic ligament followed by the meso followed by  the proximal utero-ovarian ligament and fallopian tube are taken down.  Good  hemostasis is maintained with the cautery.  The implants in the left pelvic  side wall are ablated again with care being taken to avoid the course of the  left ureter.  The implants on the bladder flap as well as the left abdominal  side wall are also ablated.  Irrigation  is carried out throughout this.  Then, using the 5 mm laparoscope through the suprapubic trocar sleeve the  bag is placed through the umbilical trocar sleeve and the fallopian tube and  ovary are retrieved.  The bag is held up to the abdominal incision.  Due to  the size of the cyst the ovary will not slip through.  Therefore, while  visualizing with the 5 mm laparoscope and maintaining pneumoperitoneum by  insufflating through the suprapubic trocar sleeve to keep the bowel and  ovary well clear of any underlying structures, a 10 mL syringe with a needle  is used to drain the ovary, again, while holding it up to the abdominal  wall.  This fluid is sent for cytology.  This decompresses the ovary enough  to allow delivery through the abdominal incision.  The umbilical trocar  sleeve is replaced and again using the operative laparoscope copious  irrigation is carried out, pneumoperitoneum is reduced and excellent  hemostasis is noted all around.  INTERCEED is backloaded through the  laparoscope and placed over the side of the left salpingo-oophorectomy as  well as over the right ovary.  These are slightly moistened.  Excess fluid  is removed.  Suprapubic trocar sleeve is removed.  Umbilical trocar sleeve  was removed reducing the pneumoperitoneum.  The 0 Vicryl is used in a figure-  of-eight fashion using each angle suture to close the fascia, the umbilical  incision with great care being taken not to pick up any underlying  structures.  These are then tied  down.  0.5% plain Marcaine is injected into  both incisions.  A 3-0 Vicryl suture is used to close the subcutaneous dead  space of the umbilical incision as well as to close the dead space below the  suprapubic incision where a  subcutaneous bleeder is noted.  Dermabond is used to close the skin on both  incisions.  Hulka tenaculum is removed and good hemostasis is noted.  All  counts are correct.  The patient is awakened, taken to recovery room in  stable condition.                                               Guy Sandifer Arleta Creek, M.D.    JET/MEDQ  D:  02/27/2002  T:  02/27/2002  Job:  [id number]

## 2010-05-23 NOTE — H&P (Signed)
NAME:  Stacy [name], Stacy [name]                      ACCOUNT NO.:  [id number]   MEDICAL RECORD NO.:  [id number]                   PATIENT TYPE:  AMB   LOCATION:  [facility]                                  FACILITY:  [facility]   PHYSICIAN:  Guy Sandifer. Arleta Creek, M.D.           DATE OF BIRTH:  1975-10-16   DATE OF ADMISSION:  02/27/2002  DATE OF DISCHARGE:                                HISTORY & PHYSICAL   REASON FOR ADMISSION:  Pelvic pain and heavy bleeding.   HISTORY OF PRESENT ILLNESS:  This patient is a 35 year old married white  female G2, P2 who has had increasingly severe left lower quadrant pain and  also left back pain.  In addition, she has had increasingly heavy menses  with more and more clotting each cycle.  She regularly overflows pads and  tampons, changing every one to two hours.  She is aware of alternate methods  of contraception.  She requests permanent sterilization.  Ultrasound on  February 16, 2002 revealed the uterus measuring 10.8 x 6.2 x 4.2 cm with a  9.6 mm endometrial stripe.  The right ovary contained a 2.8 cm simple cyst.  The left ovary contained a 4.1 cm cystic structure with internal echoes  consistent with most likely an endometrioma.  The patient is also status  post laparotomy for torsion of a right fallopian tube with apparently a  right partial salpingectomy.  She is being admitted for hysteroscopy, D&C,  open laparoscopy with bilateral application of Filshie clips, possible left  ovarian cystectomy, possible left salpingo-oophorectomy.  The procedure and  potential complications have been discussed preoperatively.   PAST MEDICAL HISTORY:  Seizure at 63 months of age and 35 years old.  No  medications.  No further seizures.   PAST SURGICAL HISTORY:  Exploratory laparotomy during pregnancy 1995 with  right partial salpingectomy for torsion of the right fallopian tube.   OBSTETRIC HISTORY:  Vaginal delivery x2.   FAMILY HISTORY:  Chronic hypertension maternal  grandmother, diabetes  maternal uncle, bladder cancer in father.   SOCIAL HISTORY:  The patient denies tobacco, alcohol, or drug abuse.   MEDICATIONS:  Ortho-Evra patch.   ALLERGIES:  No known drug allergies.   REVIEW OF SYSTEMS:  Negative except as above.   PHYSICAL EXAMINATION:  VITAL SIGNS:  Height 5 feet 6 inches, weight 172  pounds, blood pressure 120/80.  HEENT:  Without thyromegaly.  LUNGS:  Clear to auscultation.  HEART:  Regular rate and rhythm.  BACK:  Without CVA tenderness.  BREASTS:  Without mass, retraction, discharge.  ABDOMEN:  Soft, nontender without masses.  PELVIC:  Vulva, vagina, cervix without lesion.  Uterus normal size,  nontender.  Adnexa nontender without palpable masses.  EXTREMITIES:  Grossly within normal limits.  NEUROLOGIC:  Grossly within normal limits.   ASSESSMENT:  1. Left lower quadrant pain with left ovarian cyst.  2. Menorrhagia.  3. Desires permanent sterilization.   PLAN:  Open laparoscopy.  Application of Filshie clips.  Possible left  ovarian cystectomy.  Possible left salpingo-oophorectomy.  Hysteroscopy,  D&C.                                               Guy Sandifer Arleta Creek, M.D.    JET/MEDQ  D:  02/22/2002  T:  02/22/2002  Job:  [id number]

## 2010-06-03 ENCOUNTER — Telehealth: Payer: Self-pay | Admitting: *Deleted

## 2010-06-03 NOTE — Telephone Encounter (Signed)
Patient requesting Samples of Cymbalta 30 mg. She is coming to the office June 11th.

## 2010-06-03 NOTE — Telephone Encounter (Signed)
Samples available and patient notified

## 2010-06-16 ENCOUNTER — Encounter: Payer: Self-pay | Admitting: Internal Medicine

## 2010-06-16 ENCOUNTER — Other Ambulatory Visit (INDEPENDENT_AMBULATORY_CARE_PROVIDER_SITE_OTHER): Payer: PRIVATE HEALTH INSURANCE

## 2010-06-16 ENCOUNTER — Ambulatory Visit (INDEPENDENT_AMBULATORY_CARE_PROVIDER_SITE_OTHER): Payer: PRIVATE HEALTH INSURANCE | Admitting: Internal Medicine

## 2010-06-16 VITALS — BP 118/70 | HR 86 | Temp 98.4°F | Resp 16 | Wt 164.0 lb

## 2010-06-16 DIAGNOSIS — E039 Hypothyroidism, unspecified: Secondary | ICD-10-CM

## 2010-06-16 DIAGNOSIS — F329 Major depressive disorder, single episode, unspecified: Secondary | ICD-10-CM

## 2010-06-16 LAB — TSH: TSH: 1.41 u[IU]/mL (ref 0.35–5.50)

## 2010-06-16 MED ORDER — LEVOTHYROXINE SODIUM 50 MCG PO TABS: 50.0000 ug | ORAL_TABLET | Freq: Every day | ORAL | Status: DC

## 2010-06-16 NOTE — Progress Notes (Signed)
Addended by: Etta Grandchild on: 06/16/2010 05:27 PM   Modules accepted: Orders

## 2010-06-16 NOTE — Patient Instructions (Signed)
Hypothyroidism The thyroid is a large gland located in the lower front of your neck. The thyroid gland helps control metabolism. Metabolism is how your body handles food. It controls metabolism with the hormone thyroxine. When this gland is underactive (hypothyroid), it produces too little hormone.  SYMPTOMS OF HYPOTHYROIDISM  Lethargy (feeling as though you have no energy)   Cold intolerance   Weight gain (in spite of normal food intake)   Dry skin   Coarse hair   Menstrual irregularity (if severe, may lead to infertility)   Slowing of thought processes  Cardiac problems are also caused by insufficient amounts of thyroid hormone. Hypothyroidism in the newborn is cretinism, and is an extreme form. It is important that this form be treated adequately and immediately or it will lead rapidly to retarded physical and mental development. CAUSES OF HYPOTHYROIDISM These include:   Absence or destruction of thyroid tissue.  Goiter due to iodine deficiency.   Goiter due to medications.   Congenital defects (since birth).  Problems with the pituitary. This causes a lack of TSH (thyroid stimulating hormone). This hormone tells the thyroid to turn out more hormone.   DIAGNOSIS To prove hypothyroidism, your caregiver may do blood tests and ultrasound tests. Sometimes the signs are hidden. It may be necessary for your caregiver to watch this illness with blood tests either before or after diagnosis and treatment. TREATMENT  Low levels of thyroid hormone are increased by using synthetic thyroid hormone. This is a safe, effective treatment. It usually takes about four weeks to gain the full effects of the medication. After you have the full effect of the medication, it will generally take another four weeks for problems to leave. Your caregiver may start you on low doses. If you have had heart problems the dose may be gradually increased. It is generally not an emergency to get rapidly to  normal. HOME CARE INSTRUCTIONS  Take your medications as your caregiver suggests. Let your caregiver know of any medications you are taking or start taking. Your caregiver will help you with dosage schedules.   As your condition improves, your dosage needs may increase. It will be necessary to have continuing blood tests as suggested by your caregiver.   Report all suspected medication side effects to your caregiver.  SEEK MEDICAL CARE IF YOU DEVELOP:  Sweating.  Tremulousness (tremors).   Anxiety.   Rapid weight loss.   Heat intolerance.  Emotional swings.   Diarrhea.   Weakness.   SEEK IMMEDIATE MEDICAL CARE IF: You develop chest pain, an irregular heart beat (palpitations), or a rapid heart beat. MAKE SURE YOU:   Understand these instructions.   Will watch your condition.   Will get help right away if you are not doing well or get worse.  Document Released: 12/22/2004 Document Re-Released: 12/05/2007 [employer] Patient Information 2011 [employer]. 

## 2010-06-16 NOTE — Progress Notes (Signed)
  Subjective:    Patient ID: Stacy [name], female    DOB: [birth year], 35 y.o.   MRN: [id number]  Thyroid Problem Presents for follow-up visit. Symptoms include fatigue. Patient reports no anxiety, cold intolerance, constipation, depressed mood, diaphoresis, diarrhea, dry skin, hair loss, heat intolerance, hoarse voice, leg swelling, menstrual problem, nail problem, palpitations, tremors, visual change, weight gain or weight loss. The symptoms have been worsening.      Review of Systems  Constitutional: Positive for fatigue. Negative for fever, chills, weight loss, weight gain, diaphoresis, activity change, appetite change and unexpected weight change.  HENT: Negative for hoarse voice, facial swelling, neck pain and neck stiffness.   Eyes: Negative for photophobia and visual disturbance.  Respiratory: Negative for apnea, cough, choking, chest tightness, shortness of breath, wheezing and stridor.   Cardiovascular: Negative for chest pain, palpitations and leg swelling.  Gastrointestinal: Negative for nausea, vomiting, abdominal pain, diarrhea, constipation and blood in stool.  Genitourinary: Negative.  Negative for menstrual problem.  Musculoskeletal: Negative for myalgias, back pain, joint swelling, arthralgias and gait problem.  Skin: Negative for color change, pallor and rash.  Neurological: Negative.  Negative for tremors.  Hematological: Negative for cold intolerance, heat intolerance and adenopathy. Does not bruise/bleed easily.  Psychiatric/Behavioral: Negative.        Objective:   Physical Exam  [vitalsreviewed. Constitutional: She is oriented to person, place, and time. She appears well-developed and well-nourished. No distress.  HENT:  Head: Normocephalic and atraumatic.  Right Ear: External ear normal.  Left Ear: External ear normal.  Nose: Nose normal.  Mouth/Throat: Oropharynx is clear and moist. No oropharyngeal exudate.  Eyes: Conjunctivae are normal. Pupils are  equal, round, and reactive to light. Right eye exhibits no discharge. Left eye exhibits no discharge. No scleral icterus.  Neck: Normal range of motion. Neck supple. No JVD present. No tracheal deviation present. No thyromegaly present.  Cardiovascular: Normal rate, regular rhythm, normal heart sounds and intact distal pulses.  Exam reveals no gallop and no friction rub.   No murmur heard. Pulmonary/Chest: Effort normal and breath sounds normal. No stridor. No respiratory distress. She has no wheezes. She has no rales. She exhibits no tenderness.  Abdominal: Soft. Bowel sounds are normal. She exhibits no distension and no mass. There is no tenderness. There is no rebound and no guarding.  Musculoskeletal: Normal range of motion. She exhibits no edema and no tenderness.  Lymphadenopathy:    She has no cervical adenopathy.  Neurological: She is alert and oriented to person, place, and time. She has normal reflexes. No cranial nerve deficit. She exhibits normal muscle tone. Coordination normal.  Skin: Skin is warm and dry. No rash noted. She is not diaphoretic. No erythema. No pallor.  Psychiatric: She has a normal mood and affect. Her behavior is normal. Judgment and thought content normal.          Assessment & Plan:

## 2010-06-16 NOTE — Assessment & Plan Note (Signed)
This is going well.

## 2010-06-16 NOTE — Assessment & Plan Note (Signed)
She complains of fatigue so I will check her TSH level

## 2010-08-27 ENCOUNTER — Telehealth: Payer: Self-pay | Admitting: *Deleted

## 2010-08-27 NOTE — Telephone Encounter (Signed)
Pt informed that we do not have any samples of Cymbalta at this time.

## 2010-08-27 NOTE — Telephone Encounter (Signed)
Patient requesting samples of cymbalta 30 mg.

## 2010-09-01 ENCOUNTER — Telehealth: Payer: Self-pay | Admitting: *Deleted

## 2010-09-01 NOTE — Telephone Encounter (Signed)
Patient requesting samples of cymbalta, she can not afford RX.

## 2010-09-01 NOTE — Telephone Encounter (Signed)
Patient notified to pick up samples.

## 2010-09-08 ENCOUNTER — Other Ambulatory Visit: Payer: Self-pay | Admitting: Internal Medicine

## 2010-10-02 ENCOUNTER — Telehealth: Payer: Self-pay | Admitting: *Deleted

## 2010-10-02 MED ORDER — DULOXETINE HCL 30 MG PO CPEP: 30.0000 mg | ORAL_CAPSULE | Freq: Every day | ORAL | Status: DC

## 2010-10-02 NOTE — Telephone Encounter (Signed)
Samples ready, Patient informed

## 2010-10-02 NOTE — Telephone Encounter (Signed)
Patient requesting samples of cymbalta  

## 2010-12-16 ENCOUNTER — Ambulatory Visit: Payer: PRIVATE HEALTH INSURANCE | Admitting: Internal Medicine

## 2011-03-08 ENCOUNTER — Other Ambulatory Visit: Payer: Self-pay | Admitting: Internal Medicine

## 2011-05-08 ENCOUNTER — Encounter: Payer: Self-pay | Admitting: Internal Medicine

## 2011-05-14 ENCOUNTER — Telehealth: Payer: Self-pay

## 2011-05-14 NOTE — Telephone Encounter (Signed)
Patient called [facility] requesting to have cholesterol and TSH draen. Patient also c/o fatique, weight gain. Messg sent for [facility] to set appt, pt last seen 06/2010

## 2011-05-14 NOTE — Telephone Encounter (Signed)
PT is aware and has an appt. For May 29.

## 2011-06-03 ENCOUNTER — Ambulatory Visit: Payer: Self-pay | Admitting: Internal Medicine

## 2011-06-17 ENCOUNTER — Other Ambulatory Visit (INDEPENDENT_AMBULATORY_CARE_PROVIDER_SITE_OTHER): Payer: PRIVATE HEALTH INSURANCE

## 2011-06-17 ENCOUNTER — Encounter: Payer: Self-pay | Admitting: Internal Medicine

## 2011-06-17 ENCOUNTER — Ambulatory Visit (INDEPENDENT_AMBULATORY_CARE_PROVIDER_SITE_OTHER): Payer: PRIVATE HEALTH INSURANCE | Admitting: Internal Medicine

## 2011-06-17 VITALS — BP 108/66 | HR 77 | Temp 97.6°F | Resp 16 | Wt 170.8 lb

## 2011-06-17 DIAGNOSIS — F329 Major depressive disorder, single episode, unspecified: Secondary | ICD-10-CM

## 2011-06-17 DIAGNOSIS — E039 Hypothyroidism, unspecified: Secondary | ICD-10-CM

## 2011-06-17 DIAGNOSIS — F3289 Other specified depressive episodes: Secondary | ICD-10-CM

## 2011-06-17 LAB — CBC WITH DIFFERENTIAL/PLATELET
Basophils Relative: 0.9 % (ref 0.0–3.0)
Eosinophils Relative: 2.1 % (ref 0.0–5.0)
HCT: 37.8 % (ref 36.0–46.0)
Lymphs Abs: 1.3 10*3/uL (ref 0.7–4.0)
MCV: 92.5 fl (ref 78.0–100.0)
Monocytes Absolute: 0.3 10*3/uL (ref 0.1–1.0)
Platelets: 193 10*3/uL (ref 150.0–400.0)
WBC: 3.3 10*3/uL — ABNORMAL LOW (ref 4.5–10.5)

## 2011-06-17 LAB — COMPREHENSIVE METABOLIC PANEL
Alkaline Phosphatase: 47 U/L (ref 39–117)
BUN: 12 mg/dL (ref 6–23)
Glucose, Bld: 81 mg/dL (ref 70–99)
Sodium: 138 mEq/L (ref 135–145)
Total Bilirubin: 0.6 mg/dL (ref 0.3–1.2)

## 2011-06-17 LAB — LIPID PANEL
Cholesterol: 138 mg/dL (ref 0–200)
HDL: 51 mg/dL (ref >39.00)
LDL Cholesterol: 66 mg/dL (ref 0–99)
Triglycerides: 105 mg/dL (ref 0.0–149.0)
VLDL: 21 mg/dL (ref 0.0–40.0)

## 2011-06-17 MED ORDER — DULOXETINE HCL 30 MG PO CPEP: 60.0000 mg | ORAL_CAPSULE | Freq: Every day | ORAL | Status: DC

## 2011-06-17 NOTE — Patient Instructions (Signed)

## 2011-06-17 NOTE — Progress Notes (Signed)
  Subjective:    Patient ID: Stacy [name], female    DOB: [birth year], 36 y.o.   MRN: [id number]  Thyroid Problem Presents for follow-up visit. Symptoms include depressed mood, fatigue and weight gain. Patient reports no anxiety, cold intolerance, constipation, diaphoresis, diarrhea, dry skin, hair loss, heat intolerance, hoarse voice, leg swelling, menstrual problem, nail problem, palpitations, tremors, visual change or weight loss. The symptoms have been worsening.      Review of Systems  Constitutional: Positive for weight gain, fatigue and unexpected weight change (6 pound weight gain). Negative for fever, chills, weight loss, diaphoresis, activity change and appetite change.  HENT: Negative.  Negative for hoarse voice.   Eyes: Negative.   Respiratory: Negative for apnea, cough, choking, chest tightness, shortness of breath, wheezing and stridor.   Cardiovascular: Negative for chest pain, palpitations and leg swelling.  Gastrointestinal: Negative for nausea, vomiting, abdominal pain, diarrhea, constipation, blood in stool, abdominal distention and anal bleeding.  Genitourinary: Negative.  Negative for menstrual problem.  Musculoskeletal: Negative for myalgias, back pain, joint swelling, arthralgias and gait problem.  Skin: Negative for color change, pallor, rash and wound.  Neurological: Negative for dizziness, tremors, seizures, syncope, facial asymmetry, speech difficulty, weakness, light-headedness, numbness and headaches.  Hematological: Negative for cold intolerance, heat intolerance and adenopathy. Does not bruise/bleed easily.  Psychiatric/Behavioral: Positive for dysphoric mood (apathy, anhedonia). Negative for suicidal ideas, hallucinations, behavioral problems, confusion, disturbed wake/sleep cycle, self-injury, decreased concentration and agitation. The patient is not nervous/anxious and is not hyperactive.        Objective:   Physical Exam  Vitals  reviewed. Constitutional: She is oriented to person, place, and time. She appears well-developed and well-nourished. No distress.  HENT:  Head: Normocephalic and atraumatic.  Mouth/Throat: Oropharynx is clear and moist. No oropharyngeal exudate.  Eyes: Conjunctivae are normal. Right eye exhibits no discharge. Left eye exhibits no discharge. No scleral icterus.  Neck: Normal range of motion. Neck supple. No JVD present. No tracheal deviation present. No thyromegaly present.  Cardiovascular: Normal rate, regular rhythm, normal heart sounds and intact distal pulses.  Exam reveals no gallop and no friction rub.   No murmur heard. Pulmonary/Chest: Effort normal and breath sounds normal. No stridor. No respiratory distress. She has no wheezes. She has no rales. She exhibits no tenderness.  Abdominal: Soft. Bowel sounds are normal. She exhibits no distension and no mass. There is no tenderness. There is no rebound and no guarding.  Musculoskeletal: Normal range of motion. She exhibits no edema and no tenderness.  Lymphadenopathy:    She has no cervical adenopathy.  Neurological: She is oriented to person, place, and time.  Skin: Skin is warm and dry. No rash noted. She is not diaphoretic. No erythema. No pallor.  Psychiatric: She has a normal mood and affect. Her behavior is normal. Judgment and thought content normal.      Lab Results  Component Value Date   WBC 4.3* 02/28/2010   HGB 12.5 02/28/2010   HCT 35.8* 02/28/2010   PLT 198.0 02/28/2010   GLUCOSE 76 02/20/2010   ALT 20 02/20/2010   AST 23 02/20/2010   NA 140 02/20/2010   K 4.1 02/20/2010   CL 105 02/20/2010   CREATININE 0.6 02/20/2010   BUN 12 02/20/2010   CO2 31 02/20/2010   TSH 1.41 06/16/2010      Assessment & Plan:

## 2011-06-17 NOTE — Assessment & Plan Note (Signed)
She will try a higher dose of cymbalta

## 2011-06-17 NOTE — Assessment & Plan Note (Signed)
I will check her TSH today 

## 2011-06-30 ENCOUNTER — Other Ambulatory Visit: Payer: Self-pay | Admitting: Internal Medicine

## 2011-07-30 ENCOUNTER — Telehealth: Payer: Self-pay

## 2011-07-31 NOTE — Telephone Encounter (Signed)
Left message on machine for pt to return my call, closing phone note until further contact from pt 

## 2011-09-01 ENCOUNTER — Other Ambulatory Visit: Payer: Self-pay | Admitting: Internal Medicine

## 2011-09-24 ENCOUNTER — Telehealth: Payer: Self-pay | Admitting: Internal Medicine

## 2011-09-24 NOTE — Telephone Encounter (Signed)
Patient would like samples of Cymbalta 60mg , call if available to pick up

## 2011-09-24 NOTE — Telephone Encounter (Signed)
Pt advised no samples available at this time.

## 2011-10-12 ENCOUNTER — Telehealth: Payer: Self-pay | Admitting: Internal Medicine

## 2011-10-12 NOTE — Telephone Encounter (Signed)
The pt called the triage line and is hoping to get samples of cymbalta 60mg  if we have any.

## 2011-10-12 NOTE — Telephone Encounter (Signed)
Pt.notified

## 2011-10-28 ENCOUNTER — Ambulatory Visit: Payer: PRIVATE HEALTH INSURANCE

## 2011-11-30 ENCOUNTER — Telehealth: Payer: Self-pay | Admitting: *Deleted

## 2011-11-30 NOTE — Telephone Encounter (Signed)
Pt called requesting samples of Cymbalta-informed pt sample ready for pickup via VM and to callback with any questions/concerns.

## 2012-01-05 ENCOUNTER — Other Ambulatory Visit: Payer: Self-pay | Admitting: Internal Medicine

## 2012-01-07 ENCOUNTER — Other Ambulatory Visit: Payer: Self-pay | Admitting: Internal Medicine

## 2012-01-11 ENCOUNTER — Other Ambulatory Visit: Payer: Self-pay | Admitting: Internal Medicine

## 2012-01-12 ENCOUNTER — Other Ambulatory Visit: Payer: Self-pay

## 2012-01-12 MED ORDER — LEVOTHYROXINE SODIUM 50 MCG PO TABS: ORAL_TABLET | ORAL | Status: DC

## 2012-01-13 ENCOUNTER — Ambulatory Visit: Payer: Self-pay

## 2012-01-22 ENCOUNTER — Ambulatory Visit: Payer: Self-pay

## 2012-02-03 ENCOUNTER — Ambulatory Visit: Payer: Self-pay | Admitting: Internal Medicine

## 2012-02-16 ENCOUNTER — Ambulatory Visit: Payer: Self-pay | Admitting: Internal Medicine

## 2012-03-01 ENCOUNTER — Other Ambulatory Visit: Payer: Self-pay | Admitting: Internal Medicine

## 2012-03-04 ENCOUNTER — Other Ambulatory Visit (INDEPENDENT_AMBULATORY_CARE_PROVIDER_SITE_OTHER): Payer: PRIVATE HEALTH INSURANCE

## 2012-03-04 ENCOUNTER — Encounter: Payer: Self-pay | Admitting: Internal Medicine

## 2012-03-04 ENCOUNTER — Ambulatory Visit (INDEPENDENT_AMBULATORY_CARE_PROVIDER_SITE_OTHER): Payer: PRIVATE HEALTH INSURANCE | Admitting: Internal Medicine

## 2012-03-04 VITALS — BP 120/72 | HR 95 | Temp 97.8°F | Resp 16 | Wt 172.5 lb

## 2012-03-04 DIAGNOSIS — E039 Hypothyroidism, unspecified: Secondary | ICD-10-CM

## 2012-03-04 DIAGNOSIS — F329 Major depressive disorder, single episode, unspecified: Secondary | ICD-10-CM

## 2012-03-04 MED ORDER — FLUOXETINE HCL 20 MG PO TABS: 20.0000 mg | ORAL_TABLET | Freq: Every day | ORAL | Status: DC

## 2012-03-04 NOTE — Patient Instructions (Signed)

## 2012-03-04 NOTE — Progress Notes (Signed)
  Subjective:    Patient ID: Stacy [name], female    DOB: April 26, 1975, 37 y.o.   MRN: [id number]  Thyroid Problem Presents for follow-up visit. Symptoms include constipation, depressed mood, fatigue and weight gain. Patient reports no anxiety, cold intolerance, diaphoresis, diarrhea, dry skin, hair loss, heat intolerance, hoarse voice, leg swelling, menstrual problem, nail problem, palpitations, tremors, visual change or weight loss. The symptoms have been stable.      Review of Systems  Constitutional: Positive for weight gain and fatigue. Negative for weight loss and diaphoresis.  HENT: Negative.  Negative for hoarse voice.   Eyes: Negative.   Respiratory: Negative.   Cardiovascular: Negative.  Negative for palpitations.  Gastrointestinal: Positive for constipation. Negative for nausea, vomiting, abdominal pain, diarrhea and abdominal distention.  Endocrine: Negative.  Negative for cold intolerance and heat intolerance.  Genitourinary: Negative.  Negative for menstrual problem.  Musculoskeletal: Negative.   Skin: Negative.   Allergic/Immunologic: Negative.   Neurological: Negative.  Negative for tremors.  Hematological: Negative.   Psychiatric/Behavioral: Negative for suicidal ideas, behavioral problems, confusion, sleep disturbance, self-injury and agitation. The patient is nervous/anxious. The patient is not hyperactive.        Objective:   Physical Exam  Vitals reviewed. Constitutional: She is oriented to person, place, and time. She appears well-developed and well-nourished. No distress.  HENT:  Head: Normocephalic and atraumatic.  Mouth/Throat: Oropharynx is clear and moist. No oropharyngeal exudate.  Eyes: Conjunctivae are normal. Right eye exhibits no discharge. Left eye exhibits no discharge. No scleral icterus.  Neck: Normal range of motion. Neck supple. No JVD present. No tracheal deviation present. No thyromegaly present.  Cardiovascular: Normal rate, regular  rhythm, normal heart sounds and intact distal pulses.  Exam reveals no gallop and no friction rub.   No murmur heard. Pulmonary/Chest: Effort normal and breath sounds normal. No stridor. No respiratory distress. She has no wheezes. She has no rales. She exhibits no tenderness.  Abdominal: Soft. Bowel sounds are normal. She exhibits no distension and no mass. There is no tenderness. There is no rebound and no guarding.  Musculoskeletal: Normal range of motion. She exhibits no edema.  Lymphadenopathy:    She has no cervical adenopathy.  Neurological: She is oriented to person, place, and time.  Skin: Skin is warm and dry. No rash noted. She is not diaphoretic. No erythema. No pallor.  Psychiatric: She has a normal mood and affect. Her behavior is normal. Judgment and thought content normal.     Lab Results  Component Value Date   WBC 3.3* 06/17/2011   HGB 12.6 06/17/2011   HCT 37.8 06/17/2011   PLT 193.0 06/17/2011   GLUCOSE 81 06/17/2011   CHOL 138 06/17/2011   TRIG 105.0 06/17/2011   HDL 51.00 06/17/2011   LDLCALC 66 06/17/2011   ALT 13 06/17/2011   AST 17 06/17/2011   NA 138 06/17/2011   K 4.1 06/17/2011   CL 103 06/17/2011   CREATININE 0.6 06/17/2011   BUN 12 06/17/2011   CO2 29 06/17/2011   TSH 1.94 06/17/2011       Assessment & Plan:

## 2012-03-05 NOTE — Assessment & Plan Note (Signed)
I have changed cymbalta to generic prozac at her request due to financial concerns

## 2012-03-05 NOTE — Assessment & Plan Note (Signed)
TSH is on the normal range

## 2012-03-06 ENCOUNTER — Other Ambulatory Visit: Payer: Self-pay | Admitting: Internal Medicine

## 2012-03-07 ENCOUNTER — Other Ambulatory Visit: Payer: Self-pay | Admitting: Internal Medicine

## 2012-03-07 DIAGNOSIS — G2581 Restless legs syndrome: Secondary | ICD-10-CM

## 2012-03-07 MED ORDER — CLONAZEPAM 1 MG PO TABS: 1.0000 mg | ORAL_TABLET | Freq: Every evening | ORAL | Status: DC | PRN

## 2012-04-08 ENCOUNTER — Telehealth: Payer: Self-pay | Admitting: Internal Medicine

## 2012-04-08 NOTE — Telephone Encounter (Signed)
[facility] pharmacy is calling to get verbal OK to use a different brand of the medication that was called in for the patient, call back at [phone]

## 2012-04-08 NOTE — Telephone Encounter (Signed)
Spoke with pharmacy

## 2012-07-04 ENCOUNTER — Other Ambulatory Visit: Payer: Self-pay | Admitting: Internal Medicine

## 2012-07-05 ENCOUNTER — Other Ambulatory Visit: Payer: Self-pay | Admitting: Internal Medicine

## 2012-09-01 ENCOUNTER — Telehealth: Payer: Self-pay | Admitting: *Deleted

## 2012-09-01 NOTE — Telephone Encounter (Signed)
Pt called requesting Prozac 20mg  tablets to be changed to Prozac 20mg  Capsules due to capsules being cheaper.  Please advise

## 2012-09-07 NOTE — Telephone Encounter (Signed)
Spoke with pharmacist, no new Rx needed.

## 2012-09-19 ENCOUNTER — Other Ambulatory Visit: Payer: Self-pay | Admitting: Internal Medicine

## 2012-09-23 ENCOUNTER — Other Ambulatory Visit: Payer: Self-pay | Admitting: Internal Medicine

## 2012-09-23 ENCOUNTER — Telehealth: Payer: Self-pay | Admitting: Internal Medicine

## 2012-09-23 DIAGNOSIS — G2581 Restless legs syndrome: Secondary | ICD-10-CM

## 2012-09-23 MED ORDER — CLONAZEPAM 1 MG PO TABS: 1.0000 mg | ORAL_TABLET | Freq: Every evening | ORAL | Status: DC | PRN

## 2012-09-23 NOTE — Telephone Encounter (Signed)
The patient called hoping to get her Klonopin medication refilled. Please call pt's cell # back when rx is ready.  Thanks!

## 2012-09-26 ENCOUNTER — Ambulatory Visit: Payer: PRIVATE HEALTH INSURANCE | Admitting: Internal Medicine

## 2012-10-04 ENCOUNTER — Encounter: Payer: Self-pay | Admitting: Internal Medicine

## 2012-10-04 ENCOUNTER — Ambulatory Visit (INDEPENDENT_AMBULATORY_CARE_PROVIDER_SITE_OTHER): Payer: PRIVATE HEALTH INSURANCE | Admitting: Internal Medicine

## 2012-10-04 ENCOUNTER — Other Ambulatory Visit (INDEPENDENT_AMBULATORY_CARE_PROVIDER_SITE_OTHER): Payer: PRIVATE HEALTH INSURANCE

## 2012-10-04 VITALS — BP 116/68 | HR 73 | Temp 98.1°F | Resp 16 | Ht 65.0 in | Wt 173.0 lb

## 2012-10-04 DIAGNOSIS — F3289 Other specified depressive episodes: Secondary | ICD-10-CM | POA: Diagnosis not present

## 2012-10-04 DIAGNOSIS — Z23 Encounter for immunization: Secondary | ICD-10-CM | POA: Diagnosis not present

## 2012-10-04 DIAGNOSIS — G2581 Restless legs syndrome: Secondary | ICD-10-CM

## 2012-10-04 DIAGNOSIS — F329 Major depressive disorder, single episode, unspecified: Secondary | ICD-10-CM

## 2012-10-04 DIAGNOSIS — E039 Hypothyroidism, unspecified: Secondary | ICD-10-CM

## 2012-10-04 LAB — TSH: TSH: 1.39 u[IU]/mL (ref 0.35–5.50)

## 2012-10-04 MED ORDER — CLONAZEPAM 1 MG PO TABS: 1.0000 mg | ORAL_TABLET | Freq: Every evening | ORAL | Status: DC | PRN

## 2012-10-04 MED ORDER — LEVOTHYROXINE SODIUM 50 MCG PO TABS: 50.0000 ug | ORAL_TABLET | Freq: Every day | ORAL | Status: DC

## 2012-10-04 NOTE — Patient Instructions (Signed)

## 2012-10-04 NOTE — Progress Notes (Signed)
  Subjective:    Patient ID: Stacy [name], female    DOB: Sep 04, 1975, 37 y.o.   MRN: [id number]  Thyroid Problem Presents for follow-up visit. Symptoms include anxiety and fatigue. Patient reports no cold intolerance, constipation, depressed mood, diaphoresis, diarrhea, dry skin, hair loss, heat intolerance, hoarse voice, leg swelling, menstrual problem, nail problem, palpitations, tremors, visual change, weight gain or weight loss. The symptoms have been stable. Past treatments include levothyroxine. The treatment provided moderate relief.      Review of Systems  Constitutional: Positive for fatigue. Negative for fever, chills, weight loss, weight gain, diaphoresis, activity change and appetite change.  HENT: Negative.  Negative for hoarse voice.   Eyes: Negative.   Respiratory: Negative.  Negative for cough, chest tightness, shortness of breath, wheezing and stridor.   Cardiovascular: Negative.  Negative for chest pain, palpitations and leg swelling.  Gastrointestinal: Negative.  Negative for nausea, vomiting, abdominal pain, diarrhea and constipation.  Endocrine: Negative.  Negative for cold intolerance and heat intolerance.  Genitourinary: Negative.  Negative for menstrual problem.  Musculoskeletal: Negative.  Negative for myalgias, back pain, joint swelling and gait problem.  Skin: Negative.   Allergic/Immunologic: Negative.   Neurological: Negative.  Negative for dizziness, tremors and light-headedness.  Hematological: Negative.  Negative for adenopathy. Does not bruise/bleed easily.  Psychiatric/Behavioral: Negative.   All other systems reviewed and are negative.       Objective:   Physical Exam  Vitals reviewed. Constitutional: She is oriented to person, place, and time. She appears well-developed and well-nourished. No distress.  HENT:  Head: Normocephalic and atraumatic.  Mouth/Throat: Oropharynx is clear and moist. No oropharyngeal exudate.  Eyes: Conjunctivae are  normal. Right eye exhibits no discharge. Left eye exhibits no discharge. No scleral icterus.  Neck: Normal range of motion. Neck supple. No JVD present. No tracheal deviation present. No thyromegaly present.  Cardiovascular: Normal rate, regular rhythm, normal heart sounds and intact distal pulses.  Exam reveals no gallop and no friction rub.   No murmur heard. Pulmonary/Chest: Effort normal and breath sounds normal. No stridor. No respiratory distress. She has no wheezes. She has no rales. She exhibits no tenderness.  Abdominal: Soft. Bowel sounds are normal. She exhibits no distension and no mass. There is no tenderness. There is no rebound and no guarding.  Musculoskeletal: Normal range of motion. She exhibits no edema and no tenderness.  Lymphadenopathy:    She has no cervical adenopathy.  Neurological: She is oriented to person, place, and time.  Skin: Skin is warm and dry. No rash noted. She is not diaphoretic. No erythema. No pallor.  Psychiatric: She has a normal mood and affect. Her behavior is normal. Judgment and thought content normal.     Lab Results  Component Value Date   WBC 3.3* 06/17/2011   HGB 12.6 06/17/2011   HCT 37.8 06/17/2011   PLT 193.0 06/17/2011   GLUCOSE 81 06/17/2011   CHOL 138 06/17/2011   TRIG 105.0 06/17/2011   HDL 51.00 06/17/2011   LDLCALC 66 06/17/2011   ALT 13 06/17/2011   AST 17 06/17/2011   NA 138 06/17/2011   K 4.1 06/17/2011   CL 103 06/17/2011   CREATININE 0.6 06/17/2011   BUN 12 06/17/2011   CO2 29 06/17/2011   TSH 1.41 03/04/2012       Assessment & Plan:

## 2012-10-05 NOTE — Assessment & Plan Note (Signed)
Her synthroid dose is perfect

## 2012-10-05 NOTE — Assessment & Plan Note (Signed)
Her symptoms are well controlled on qHS klonopin

## 2013-02-14 ENCOUNTER — Encounter: Payer: PRIVATE HEALTH INSURANCE | Admitting: Internal Medicine

## 2013-02-19 ENCOUNTER — Other Ambulatory Visit: Payer: Self-pay | Admitting: Internal Medicine

## 2013-03-24 ENCOUNTER — Other Ambulatory Visit (INDEPENDENT_AMBULATORY_CARE_PROVIDER_SITE_OTHER): Payer: PRIVATE HEALTH INSURANCE

## 2013-03-24 ENCOUNTER — Encounter: Payer: Self-pay | Admitting: Internal Medicine

## 2013-03-24 ENCOUNTER — Ambulatory Visit (INDEPENDENT_AMBULATORY_CARE_PROVIDER_SITE_OTHER): Payer: PRIVATE HEALTH INSURANCE | Admitting: Internal Medicine

## 2013-03-24 VITALS — BP 132/72 | HR 84 | Temp 98.1°F | Resp 16 | Ht 65.0 in | Wt 179.2 lb

## 2013-03-24 DIAGNOSIS — R0789 Other chest pain: Secondary | ICD-10-CM

## 2013-03-24 DIAGNOSIS — J069 Acute upper respiratory infection, unspecified: Secondary | ICD-10-CM

## 2013-03-24 DIAGNOSIS — E039 Hypothyroidism, unspecified: Secondary | ICD-10-CM

## 2013-03-24 LAB — TSH: TSH: 1.56 u[IU]/mL (ref 0.35–5.50)

## 2013-03-24 NOTE — Progress Notes (Signed)
Pre visit review using our clinic review tool, if applicable. No additional management support is needed unless otherwise documented below in the visit note. 

## 2013-03-24 NOTE — Assessment & Plan Note (Signed)
Her EKG is normal Exam is normal I think that this is anxiety, will follow for now

## 2013-03-24 NOTE — Patient Instructions (Signed)

## 2013-03-24 NOTE — Assessment & Plan Note (Signed)
This appears to be a viral URI She will OTC symptom relief and will let me know if she develops any new or worsening s/s

## 2013-03-24 NOTE — Progress Notes (Signed)
Subjective:    Patient ID: Stacy [name], female    DOB: 08-18-1975, 38 y.o.   MRN: [id number]  HPI Comments: She complains of "chest tightness" for several months, she feels "nervous" about her heart.  Thyroid Problem Presents for follow-up visit. Symptoms include anxiety. Patient reports no cold intolerance, constipation, depressed mood, diaphoresis, diarrhea, dry skin, fatigue, hair loss, heat intolerance, hoarse voice, leg swelling, menstrual problem, nail problem, palpitations, tremors, visual change, weight gain or weight loss. The symptoms have been improving. Past treatments include levothyroxine. The treatment provided significant relief.      Review of Systems  Constitutional: Negative.  Negative for fever, chills, weight loss, weight gain, diaphoresis, activity change, appetite change, fatigue and unexpected weight change.  HENT: Positive for sore throat. Negative for congestion, ear pain, hoarse voice, nosebleeds, postnasal drip, rhinorrhea, sinus pressure, trouble swallowing and voice change.   Eyes: Negative.   Respiratory: Positive for cough. Negative for apnea, choking, chest tightness, shortness of breath, wheezing and stridor.        She reports a one week history of mild cough productive of scant yellow phlegm.  Cardiovascular: Negative.  Negative for chest pain, palpitations and leg swelling.  Gastrointestinal: Negative.  Negative for nausea, vomiting, abdominal pain, diarrhea, constipation and blood in stool.  Endocrine: Negative.  Negative for cold intolerance and heat intolerance.  Genitourinary: Negative.  Negative for menstrual problem.  Musculoskeletal: Negative.   Skin: Negative.   Allergic/Immunologic: Negative.   Neurological: Negative.  Negative for dizziness, tremors, syncope, facial asymmetry, weakness, light-headedness and numbness.  Hematological: Negative.  Negative for adenopathy. Does not bruise/bleed easily.  Psychiatric/Behavioral: Negative.         Objective:   Physical Exam  Vitals reviewed. Constitutional: She is oriented to person, place, and time. She appears well-developed and well-nourished.  Non-toxic appearance. She does not have a sickly appearance. She does not appear ill. No distress.  HENT:  Head: Normocephalic and atraumatic.  Mouth/Throat: Oropharynx is clear and moist. No oropharyngeal exudate.  Eyes: Conjunctivae are normal. Right eye exhibits no discharge. Left eye exhibits no discharge. No scleral icterus.  Neck: Normal range of motion. Neck supple. No JVD present. No tracheal deviation present. No thyromegaly present.  Cardiovascular: Normal rate, regular rhythm, normal heart sounds and intact distal pulses.  Exam reveals no gallop and no friction rub.   No murmur heard. Pulmonary/Chest: Effort normal and breath sounds normal. No accessory muscle usage or stridor. Not tachypneic. No respiratory distress. She has no decreased breath sounds. She has no wheezes. She has no rhonchi. She has no rales. She exhibits no tenderness.  Abdominal: Soft. Bowel sounds are normal. She exhibits no distension and no mass. There is no tenderness. There is no rebound and no guarding.  Musculoskeletal: Normal range of motion. She exhibits no edema and no tenderness.  Lymphadenopathy:    She has no cervical adenopathy.  Neurological: She is oriented to person, place, and time.  Skin: Skin is warm and dry. No rash noted. She is not diaphoretic. No erythema. No pallor.  Psychiatric: She has a normal mood and affect. Her behavior is normal. Judgment and thought content normal.     Lab Results  Component Value Date   WBC 3.3* 06/17/2011   HGB 12.6 06/17/2011   HCT 37.8 06/17/2011   PLT 193.0 06/17/2011   GLUCOSE 81 06/17/2011   CHOL 138 06/17/2011   TRIG 105.0 06/17/2011   HDL 51.00 06/17/2011   LDLCALC 66 06/17/2011  ALT 13 06/17/2011   AST 17 06/17/2011   NA 138 06/17/2011   K 4.1 06/17/2011   CL 103 06/17/2011   CREATININE 0.6  06/17/2011   BUN 12 06/17/2011   CO2 29 06/17/2011   TSH 1.39 10/04/2012       Assessment & Plan:

## 2013-03-24 NOTE — Assessment & Plan Note (Signed)
I will recheck her TSH and will adjust her dose if needed 

## 2013-03-27 ENCOUNTER — Other Ambulatory Visit: Payer: Self-pay | Admitting: Internal Medicine

## 2013-03-27 NOTE — Telephone Encounter (Signed)
RX called to pt pharmacy

## 2013-03-29 ENCOUNTER — Other Ambulatory Visit: Payer: Self-pay | Admitting: Internal Medicine

## 2013-04-19 ENCOUNTER — Encounter: Payer: Self-pay | Admitting: Internal Medicine

## 2013-04-20 ENCOUNTER — Other Ambulatory Visit: Payer: Self-pay | Admitting: Internal Medicine

## 2013-07-03 ENCOUNTER — Other Ambulatory Visit: Payer: Self-pay | Admitting: Internal Medicine

## 2013-09-15 ENCOUNTER — Other Ambulatory Visit: Payer: Self-pay | Admitting: Obstetrics and Gynecology

## 2013-09-18 LAB — CYTOLOGY - PAP

## 2013-09-19 ENCOUNTER — Other Ambulatory Visit: Payer: Self-pay | Admitting: Internal Medicine

## 2013-09-25 ENCOUNTER — Telehealth: Payer: Self-pay | Admitting: *Deleted

## 2013-09-25 ENCOUNTER — Telehealth: Payer: Self-pay | Admitting: Internal Medicine

## 2013-09-25 MED ORDER — CLONAZEPAM 1 MG PO TABS: 1.0000 mg | ORAL_TABLET | Freq: Every day | ORAL | Status: DC

## 2013-09-25 NOTE — Telephone Encounter (Signed)
[facility] Triage Call Report Triage Record Num: [id number] Operator: Amada Kingfisher Patient Name: Stacy [name] Call Date & Time: 09/22/2013 7:23:24PM Patient Phone: [phone] PCP: Scarlette Calico Patient Gender: Female PCP Fax : Patient DOB: 1975/01/07 Practice Name: [facility] Reason for Call: Caller: Tyashia/Patient; PCP: Scarlette Calico (Adults only); CB#: [phone]; Call regarding Medication Issue; Medication(s): Klonopin; says she had pharmacy send over a refill request and when she went back to pick it up, she was told an appt was necessary; has OV scheduled for 09/29/13; checked epic and saw that Dr.Jones refused the refill; has one pill; suggested halving it tonight and tomorrow and calling the office 09/25/13 for a rx refill Protocol(s) Used: Office Note Recommended Outcome per Protocol: Information Noted and Sent to Office Reason for Outcome: Caller information to office Care Advice: ~ 09/

## 2013-09-25 NOTE — Telephone Encounter (Signed)
Patient is currently out of klonopin.  She has an appt scheduled for this Friday the 25th at 10:00am.  Patient states that the script was denied the first time bc she needed an appt but her pharmacy did not let her know and she has ran out.  She would like to know if the appt she has Friday is okay to get klonopin filled before then?  Patient uses [facility] in [place].  Thanks!

## 2013-09-25 NOTE — Telephone Encounter (Signed)
See Rx 

## 2013-09-29 ENCOUNTER — Ambulatory Visit: Payer: PRIVATE HEALTH INSURANCE | Admitting: Internal Medicine

## 2013-10-13 ENCOUNTER — Ambulatory Visit: Payer: PRIVATE HEALTH INSURANCE | Admitting: Internal Medicine

## 2013-10-27 ENCOUNTER — Ambulatory Visit: Payer: PRIVATE HEALTH INSURANCE | Admitting: Internal Medicine

## 2014-09-24 ENCOUNTER — Other Ambulatory Visit: Payer: Self-pay | Admitting: Obstetrics and Gynecology

## 2014-09-24 DIAGNOSIS — R928 Other abnormal and inconclusive findings on diagnostic imaging of breast: Secondary | ICD-10-CM

## 2014-09-27 ENCOUNTER — Ambulatory Visit
Admission: RE | Admit: 2014-09-27 | Discharge: 2014-09-27 | Disposition: A | Discharge status: Home / Self Care | Payer: PRIVATE HEALTH INSURANCE | Source: Ambulatory Visit | Attending: Obstetrics and Gynecology | Admitting: Obstetrics and Gynecology

## 2014-09-27 ENCOUNTER — Other Ambulatory Visit: Payer: Self-pay | Admitting: Obstetrics and Gynecology

## 2014-09-27 DIAGNOSIS — R928 Other abnormal and inconclusive findings on diagnostic imaging of breast: Secondary | ICD-10-CM

## 2014-09-27 HISTORY — PX: BREAST BIOPSY: SHX20

## 2014-09-28 ENCOUNTER — Other Ambulatory Visit: Payer: PRIVATE HEALTH INSURANCE

## 2015-02-18 ENCOUNTER — Other Ambulatory Visit: Payer: Self-pay | Admitting: Obstetrics and Gynecology

## 2015-02-18 DIAGNOSIS — N6320 Unspecified lump in the left breast, unspecified quadrant: Secondary | ICD-10-CM

## 2015-03-29 ENCOUNTER — Ambulatory Visit
Admission: RE | Admit: 2015-03-29 | Discharge: 2015-03-29 | Disposition: A | Discharge status: Home / Self Care | Payer: PRIVATE HEALTH INSURANCE | Source: Ambulatory Visit | Attending: Obstetrics and Gynecology | Admitting: Obstetrics and Gynecology

## 2015-03-29 DIAGNOSIS — N6320 Unspecified lump in the left breast, unspecified quadrant: Secondary | ICD-10-CM

## 2015-09-10 ENCOUNTER — Other Ambulatory Visit: Payer: Self-pay | Admitting: Obstetrics and Gynecology

## 2015-09-10 DIAGNOSIS — Z1231 Encounter for screening mammogram for malignant neoplasm of breast: Secondary | ICD-10-CM

## 2015-09-23 ENCOUNTER — Ambulatory Visit: Payer: PRIVATE HEALTH INSURANCE

## 2015-10-30 ENCOUNTER — Ambulatory Visit
Admission: RE | Admit: 2015-10-30 | Discharge: 2015-10-30 | Disposition: A | Discharge status: Home / Self Care | Payer: PRIVATE HEALTH INSURANCE | Source: Ambulatory Visit | Attending: Obstetrics and Gynecology | Admitting: Obstetrics and Gynecology

## 2015-10-30 DIAGNOSIS — Z1231 Encounter for screening mammogram for malignant neoplasm of breast: Secondary | ICD-10-CM

## 2016-09-24 ENCOUNTER — Other Ambulatory Visit: Payer: Self-pay | Admitting: Obstetrics and Gynecology

## 2016-09-24 DIAGNOSIS — Z1231 Encounter for screening mammogram for malignant neoplasm of breast: Secondary | ICD-10-CM

## 2016-11-11 ENCOUNTER — Ambulatory Visit
Admission: RE | Admit: 2016-11-11 | Discharge: 2016-11-11 | Disposition: A | Discharge status: Home / Self Care | Payer: PRIVATE HEALTH INSURANCE | Source: Ambulatory Visit | Attending: Obstetrics and Gynecology | Admitting: Obstetrics and Gynecology

## 2016-11-11 DIAGNOSIS — Z1231 Encounter for screening mammogram for malignant neoplasm of breast: Secondary | ICD-10-CM

## 2017-10-11 ENCOUNTER — Other Ambulatory Visit: Payer: Self-pay | Admitting: Obstetrics and Gynecology

## 2017-10-11 DIAGNOSIS — Z1231 Encounter for screening mammogram for malignant neoplasm of breast: Secondary | ICD-10-CM

## 2017-11-16 ENCOUNTER — Ambulatory Visit: Payer: PRIVATE HEALTH INSURANCE

## 2017-12-27 ENCOUNTER — Ambulatory Visit: Payer: PRIVATE HEALTH INSURANCE

## 2017-12-31 ENCOUNTER — Ambulatory Visit
Admission: RE | Admit: 2017-12-31 | Discharge: 2017-12-31 | Disposition: A | Discharge status: Home / Self Care | Payer: PRIVATE HEALTH INSURANCE | Source: Ambulatory Visit | Attending: Obstetrics and Gynecology | Admitting: Obstetrics and Gynecology

## 2017-12-31 DIAGNOSIS — Z1231 Encounter for screening mammogram for malignant neoplasm of breast: Secondary | ICD-10-CM

## 2018-10-11 ENCOUNTER — Other Ambulatory Visit: Payer: Self-pay | Admitting: Obstetrics and Gynecology

## 2018-10-11 DIAGNOSIS — Z1231 Encounter for screening mammogram for malignant neoplasm of breast: Secondary | ICD-10-CM

## 2019-01-02 ENCOUNTER — Ambulatory Visit: Payer: PRIVATE HEALTH INSURANCE

## 2019-01-30 ENCOUNTER — Other Ambulatory Visit: Payer: Self-pay

## 2019-01-30 ENCOUNTER — Ambulatory Visit
Admission: RE | Admit: 2019-01-30 | Discharge: 2019-01-30 | Disposition: A | Discharge status: Home / Self Care | Payer: PRIVATE HEALTH INSURANCE | Source: Ambulatory Visit | Attending: Obstetrics and Gynecology | Admitting: Obstetrics and Gynecology

## 2019-01-30 ENCOUNTER — Ambulatory Visit: Payer: PRIVATE HEALTH INSURANCE

## 2019-01-30 DIAGNOSIS — Z1231 Encounter for screening mammogram for malignant neoplasm of breast: Secondary | ICD-10-CM

## 2019-04-17 DIAGNOSIS — R251 Tremor, unspecified: Secondary | ICD-10-CM | POA: Insufficient documentation

## 2019-05-23 ENCOUNTER — Encounter: Payer: Self-pay | Admitting: *Deleted

## 2019-05-23 ENCOUNTER — Other Ambulatory Visit: Payer: Self-pay | Admitting: *Deleted

## 2019-05-24 ENCOUNTER — Ambulatory Visit: Payer: PRIVATE HEALTH INSURANCE | Admitting: Diagnostic Neuroimaging

## 2019-07-05 ENCOUNTER — Telehealth: Payer: Self-pay | Admitting: Diagnostic Neuroimaging

## 2019-07-05 ENCOUNTER — Ambulatory Visit: Payer: Managed Care, Other (non HMO) | Admitting: Diagnostic Neuroimaging

## 2019-07-05 ENCOUNTER — Encounter: Payer: Self-pay | Admitting: Diagnostic Neuroimaging

## 2019-07-05 VITALS — BP 131/82 | HR 92 | Ht 65.5 in | Wt 211.0 lb

## 2019-07-05 DIAGNOSIS — R200 Anesthesia of skin: Secondary | ICD-10-CM

## 2019-07-05 DIAGNOSIS — G43009 Migraine without aura, not intractable, without status migrainosus: Secondary | ICD-10-CM

## 2019-07-05 DIAGNOSIS — R251 Tremor, unspecified: Secondary | ICD-10-CM

## 2019-07-05 NOTE — Telephone Encounter (Signed)
[employer] order sent to [facility]. They will obtain the auth and reach out to the patient to schedule.  °

## 2019-07-05 NOTE — Progress Notes (Signed)
[facility]  PATIENT: Stacy [name] DOB: 01/13/1975  REFERRING CLINICIAN: Daryll Brod, MD HISTORY FROM: patient  REASON FOR VISIT: new consult    HISTORICAL  CHIEF COMPLAINT:  Chief Complaint  Patient presents with  . Tremors    rm 7 New Pt "tremors in hands; hands/fingers going numb 6-12 months"  . Numbness    HISTORY OF PRESENT ILLNESS:   44 year old female here for evaluation of tremors, numbness, headaches.  Patient has had tremors in hands for more than 1 year.  Symptoms worse with stress or anxiety.  Tremor mainly present with certain postures or holding objects.  Also has numbness and tingling in hands for 6 to 12 months.  Also has history of headaches with throbbing sensation, tingling sensation, nausea, photophobia, exertional quality, since fourth grade.   REVIEW OF SYSTEMS: Full 14 system review of systems performed and negative with exception of: as per HPI.   ALLERGIES: No Known Allergies  HOME MEDICATIONS: Outpatient Medications Prior to Visit  Medication Sig Dispense Refill  . busPIRone (BUSPAR) 15 MG tablet Take 15 mg by mouth 2 (two) times daily.    . clonazePAM (KLONOPIN) 1 MG tablet Take 1 tablet (1 mg total) by mouth at bedtime. 30 tablet 0  . DULoxetine (CYMBALTA) 60 MG capsule 2 tablets nightly    . ergocalciferol (VITAMIN D2) 1.25 MG (50000 UT) capsule Take 1 capsule by mouth once a week.    . fenofibrate 160 MG tablet Take by mouth daily.    Marland Kitchen levothyroxine (EUTHYROX) 50 MCG tablet Take 50 mcg by mouth daily before breakfast.    . pramipexole (MIRAPEX) 1 MG tablet Take 1 mg by mouth at bedtime.    Marland Kitchen FLUoxetine (PROZAC) 20 MG capsule TAKE ONE CAPSULE BY MOUTH ONCE DAILY 90 capsule 3  . FLUoxetine (PROZAC) 20 MG tablet Take 1 tablet (20 mg total) by mouth daily. 90 tablet 3  . levothyroxine (SYNTHROID, LEVOTHROID) 50 MCG tablet TAKE ONE TABLET BY MOUTH ONCE DAILY BEFORE  BREAKFAST 90 tablet 0   No  facility-administered medications prior to visit.    PAST MEDICAL HISTORY: Past Medical History:  Diagnosis Date  . Depressive disorder   . Hypoglycemia   . Hypokalemia   . Hypothyroidism   . Migraine   . Numbness and tingling    hands  . RLS (restless legs syndrome)   . Thyroid disease   . Tremor    hands    PAST SURGICAL HISTORY: Past Surgical History:  Procedure Laterality Date  . ABDOMINAL HYSTERECTOMY  2005  . BREAST BIOPSY      FAMILY HISTORY: Family History  Problem Relation Age of Onset  . Diabetes Mother   . Hyperlipidemia Mother   . Hypertension Mother   . Thyroid disease Mother   . Bladder Cancer Father   . Stroke Father   . Hyperlipidemia Father   . Heart disease Father   . Hypothyroidism Father   . Hypertension Father   . Colon cancer Maternal Uncle   . Graves' disease Sister   . Breast cancer Neg Hx     SOCIAL HISTORY: Social History   Socioeconomic History  . Marital status: Married    Spouse name: Not on file  . Number of children: 2  . Years of education: 75  . Highest education level: Not on file  Occupational History  . Not on file  Tobacco Use  . Smoking status: Never Smoker  . Smokeless tobacco: Never Used  Substance  and Sexual Activity  . Alcohol use: Never  . Drug use: Never  . Sexual activity: Yes    Birth control/protection: Surgical  Other Topics Concern  . Not on file  Social History Narrative   Married   Regular exercise-yes   Does not work outside the home   Caffeine 1-2 daily   Social Determinants of Health   Financial Resource Strain:   . Difficulty of Paying Living Expenses:   Food Insecurity:   . Worried About Charity fundraiser in the Last Year:   . Arboriculturist in the Last Year:   Transportation Needs:   . Film/video editor (Medical):   Marland Kitchen Lack of Transportation (Non-Medical):   Physical Activity:   . Days of Exercise per Week:   . Minutes of Exercise per Session:   Stress:   . Feeling of  Stress :   Social Connections:   . Frequency of Communication with Friends and Family:   . Frequency of Social Gatherings with Friends and Family:   . Attends Religious Services:   . Active Member of Clubs or Organizations:   . Attends Archivist Meetings:   Marland Kitchen Marital Status:   Intimate Partner Violence:   . Fear of Current or Ex-Partner:   . Emotionally Abused:   Marland Kitchen Physically Abused:   . Sexually Abused:      PHYSICAL EXAM  GENERAL EXAM/CONSTITUTIONAL: Vitals:  Vitals:   07/05/19 0828  BP: 131/82  Pulse: 92  Weight: 211 lb (95.7 kg)  Height: 5' 5.5" (1.664 m)     Body mass index is 34.58 kg/m. Wt Readings from Last 3 Encounters:  07/05/19 211 lb (95.7 kg)  03/24/13 179 lb 4 oz (81.3 kg)  10/04/12 173 lb (78.5 kg)     Patient is in no distress; well developed, nourished and groomed; neck is supple  CARDIOVASCULAR:  Examination of carotid arteries is normal; no carotid bruits  Regular rate and rhythm, no murmurs  Examination of peripheral vascular system by observation and palpation is normal  EYES:  Ophthalmoscopic exam of optic discs and posterior segments is normal; no papilledema or hemorrhages  No exam data present  MUSCULOSKELETAL:  Gait, strength, tone, movements noted in Neurologic exam below  NEUROLOGIC: MENTAL STATUS:  No flowsheet data found.  awake, alert, oriented to person, place and time  recent and remote memory intact  normal attention and concentration  language fluent, comprehension intact, naming intact  fund of knowledge appropriate  CRANIAL NERVE:   2nd - no papilledema on fundoscopic exam  2nd, 3rd, 4th, 6th - pupils equal and reactive to light, visual fields full to confrontation, extraocular muscles intact, no nystagmus  5th - facial sensation symmetric  7th - facial strength symmetric  8th - hearing intact  9th - palate elevates symmetrically, uvula midline  11th - shoulder shrug symmetric  12th  - tongue protrusion midline  MOTOR:   MILD POSTURAL TREMOR IN BUE  normal bulk and tone, full strength in the BUE, BLE  SLIGHTLY FLATTENING OF BILATERAL APB  SENSORY:   normal and symmetric to light touch, pinprick, temperature, vibration; SLIGHTLY DECR PP IN BILATERAL FINGERS DIGITS 1-4  COORDINATION:   finger-nose-finger, fine finger movements normal  REFLEXES:   deep tendon reflexes 2+ and symmetric  GAIT/STATION:   narrow based gait     DIAGNOSTIC DATA (LABS, IMAGING, TESTING) - I reviewed patient records, labs, notes, testing and imaging myself where available.  Lab Results  Component  Value Date   WBC 3.3 (L) 06/17/2011   HGB 12.6 06/17/2011   HCT 37.8 06/17/2011   MCV 92.5 06/17/2011   PLT 193.0 06/17/2011      Component Value Date/Time   NA 138 06/17/2011 1026   K 4.1 06/17/2011 1026   CL 103 06/17/2011 1026   CO2 29 06/17/2011 1026   GLUCOSE 81 06/17/2011 1026   BUN 12 06/17/2011 1026   CREATININE 0.6 06/17/2011 1026   CALCIUM 8.8 06/17/2011 1026   PROT 6.8 06/17/2011 1026   ALBUMIN 4.0 06/17/2011 1026   AST 17 06/17/2011 1026   ALT 13 06/17/2011 1026   ALKPHOS 47 06/17/2011 1026   BILITOT 0.6 06/17/2011 1026   GFRNONAA 112.28 08/15/2009 1321   Lab Results  Component Value Date   CHOL 138 06/17/2011   HDL 51.00 06/17/2011   LDLCALC 66 06/17/2011   TRIG 105.0 06/17/2011   CHOLHDL 3 06/17/2011   No results found for: HGBA1C No results found for: VITAMINB12 Lab Results  Component Value Date   TSH 1.56 03/24/2013       ASSESSMENT AND PLAN  44 y.o. year old female here with:   Dx:  1. Tremor   2. Hand numbness   3. Migraine without aura and without status migrainosus, not intractable      PLAN:  TREMORS (likely enhanced physiologic tremor; anxiety state) - check MRI brain, TSH, B12  HAND NUMBNESS - check EMG/NCS  MIGRAINE WITHOUT AURA - consider migraine meds in future  Orders Placed This Encounter  Procedures  .  MR BRAIN W WO CONTRAST  . Vitamin B12  . TSH  . Hemoglobin A1c  . NCV with EMG(electromyography)    Return for for NCV/EMG.    Penni Bombard, MD 7/93/9030, 0:92 AM Certified in Neurology, Neurophysiology and Neuroimaging  [facility] [address], [place], [place] [address] [phone]

## 2019-07-05 NOTE — Patient Instructions (Signed)
  TREMORS (likely enhanced physiologic tremor; anxiety state) - check MRI brain, labs  HAND NUMBNESS - check EMG/NCS (nerve testing)  MIGRAINE WITHOUT AURA - consider migraine meds in future

## 2019-07-06 LAB — VITAMIN B12: Vitamin B-12: 546 pg/mL (ref 232–1245)

## 2019-07-06 LAB — HEMOGLOBIN A1C
Est. average glucose Bld gHb Est-mCnc: 103 mg/dL
Hgb A1c MFr Bld: 5.2 % (ref 4.8–5.6)

## 2019-07-06 LAB — TSH: TSH: 3.27 u[IU]/mL (ref 0.450–4.500)

## 2019-07-11 ENCOUNTER — Encounter: Payer: Self-pay | Admitting: *Deleted

## 2019-07-11 NOTE — Telephone Encounter (Signed)
Stacy [name] did not approve the MRI.   "Based on [employer] guidelines Movement disorders, we cannot approve this request. Your records show that you have a problem that affects your body movement. The reason this request cannot be approved is because it is not supported for common symptoms (such as tremors, shaking, tics, spasms, and/or restless legs) for a movement disorder."  There is an option to do a peer to peer the phone number is [phone]. The case number is [id number]. It does need to need to be schedule but they informed me there is no deadline for it to to be scheduled by,

## 2019-07-11 NOTE — Telephone Encounter (Signed)
Follow up EMG results first; then may consider peer-peer review of MRI. -VRP

## 2019-07-12 ENCOUNTER — Encounter: Payer: Self-pay | Admitting: *Deleted

## 2019-07-12 NOTE — Telephone Encounter (Signed)
Noted, thank you

## 2019-07-16 ENCOUNTER — Other Ambulatory Visit: Payer: PRIVATE HEALTH INSURANCE

## 2019-08-03 ENCOUNTER — Encounter: Payer: Managed Care, Other (non HMO) | Admitting: Diagnostic Neuroimaging

## 2019-11-22 ENCOUNTER — Other Ambulatory Visit: Payer: Self-pay | Admitting: Obstetrics and Gynecology

## 2019-11-22 DIAGNOSIS — Z1231 Encounter for screening mammogram for malignant neoplasm of breast: Secondary | ICD-10-CM

## 2020-01-11 ENCOUNTER — Ambulatory Visit (INDEPENDENT_AMBULATORY_CARE_PROVIDER_SITE_OTHER): Payer: Managed Care, Other (non HMO) | Admitting: Diagnostic Neuroimaging

## 2020-01-11 ENCOUNTER — Encounter: Payer: Managed Care, Other (non HMO) | Admitting: Diagnostic Neuroimaging

## 2020-01-11 ENCOUNTER — Other Ambulatory Visit: Payer: Self-pay

## 2020-01-11 DIAGNOSIS — Z0289 Encounter for other administrative examinations: Secondary | ICD-10-CM

## 2020-01-11 DIAGNOSIS — R200 Anesthesia of skin: Secondary | ICD-10-CM | POA: Diagnosis not present

## 2020-01-12 NOTE — Procedures (Signed)
[facility]  NCS (NERVE CONDUCTION STUDY) WITH EMG (ELECTROMYOGRAPHY) REPORT   STUDY DATE: 01/11/20 PATIENT NAME: Stacy [name] DOB: [birth year] MRN: [id number]  ORDERING CLINICIAN: Andrey Spearman, MD   TECHNOLOGIST: Sherre Scarlet ELECTROMYOGRAPHER: Earlean Polka. Mahad Newstrom, MD  CLINICAL INFORMATION: 45 year old female with bilateral hand numbness.   FINDINGS: NERVE CONDUCTION STUDY:  Bilateral median and left ulnar motor responses are normal.  Bilateral median sensory responses have slightly prolonged peak latencies and normal amplitudes.  Bilateral median to ulnar transcarpal mixed nerve comparison studies show prolonged peak latency differences.  Left ulnar sensory response is normal.  Left ulnar F wave latency is normal.   NEEDLE ELECTROMYOGRAPHY:  Needle examination of left upper extremity is normal.   IMPRESSION:   Mildly abnormal study demonstrate: -Mild bilateral median neuropathies at the wrist consistent with mild bilateral carpal tunnel syndrome.     INTERPRETING PHYSICIAN:  Penni Bombard, MD Certified in Neurology, Neurophysiology and Neuroimaging  [facility] [address], [place], [place] [address] [phone]    [facility]    Nerve / Sites Muscle Latency Ref. Amplitude Ref. Rel Amp Segments Distance Velocity Ref. Area    ms ms mV mV %  cm m/s m/s mVms  L Median - APB     Wrist APB 4.0 ?4.4 6.3 ?4.0 100 Wrist - APB 7   23.9     Upper arm APB 7.5  7.1  112 Upper arm - Wrist 20 57 ?49 27.7  R Median - APB     Wrist APB 4.0 ?4.4 9.6 ?4.0 100 Wrist - APB 7   38.3     Upper arm APB 7.5  9.5  98.3 Upper arm - Wrist 21 60 ?49 38.1  L Ulnar - ADM     Wrist ADM 2.7 ?3.3 10.3 ?6.0 100 Wrist - ADM 7   33.6     B.Elbow ADM 5.8  8.7  84.5 B.Elbow - Wrist 19 62 ?49 28.4     A.Elbow ADM 7.1  9.8  113 A.Elbow - B.Elbow 8 61 ?49 33.0         A.Elbow - Wrist               SNC    Nerve / Sites Rec. Site Peak  Lat Ref.  Amp Ref. Segments Distance Peak Diff Ref.    ms ms V V  cm ms ms  L Median, Ulnar - Transcarpal comparison     Median Palm Wrist 2.6 ?2.2 31 ?35 Median Palm - Wrist 8       Ulnar Palm Wrist 1.8 ?2.2 16 ?12 Ulnar Palm - Wrist 8          Median Palm - Ulnar Palm  0.9 ?0.4  R Median, Ulnar - Transcarpal comparison     Median Palm Wrist 3.1 ?2.2 30 ?35 Median Palm - Wrist 8       Ulnar Palm Wrist 2.0 ?2.2 29 ?12 Ulnar Palm - Wrist 8          Median Palm - Ulnar Palm  1.1 ?0.4  L Median - Orthodromic (Dig II, Mid palm)     Dig II Wrist 3.5 ?3.4 12 ?10 Dig II - Wrist 13    R Median - Orthodromic (Dig II, Mid palm)     Dig II Wrist 3.9 ?3.4 15 ?10 Dig II - Wrist 13    L Ulnar - Orthodromic, (Dig V, Mid palm)     Dig V  Wrist 2.4 ?3.1 9 ?5 Dig V - Wrist 46                 F  Wave    Nerve F Lat Ref.   ms ms  L Ulnar - ADM 25.6 ?32.0       EMG Summary Table    Spontaneous MUAP Recruitment  Muscle IA Fib PSW Fasc Other Amp Dur. Poly Pattern  L. Deltoid Normal None None None _______ Normal Normal Normal Normal  L. Biceps brachii Normal None None None _______ Normal Normal Normal Normal  L. Triceps brachii Normal None None None _______ Normal Normal Normal Normal  L. Flexor carpi radialis Normal None None None _______ Normal Normal Normal Normal  L. First dorsal interosseous Normal None None None _______ Normal Normal Normal Normal

## 2020-01-31 ENCOUNTER — Ambulatory Visit: Payer: PRIVATE HEALTH INSURANCE

## 2020-02-12 DIAGNOSIS — E782 Mixed hyperlipidemia: Secondary | ICD-10-CM | POA: Insufficient documentation

## 2020-03-08 ENCOUNTER — Other Ambulatory Visit: Payer: Self-pay

## 2020-03-08 ENCOUNTER — Ambulatory Visit
Admission: RE | Admit: 2020-03-08 | Discharge: 2020-03-08 | Disposition: A | Discharge status: Home / Self Care | Payer: Managed Care, Other (non HMO) | Source: Ambulatory Visit | Attending: Obstetrics and Gynecology | Admitting: Obstetrics and Gynecology

## 2020-03-08 DIAGNOSIS — Z1231 Encounter for screening mammogram for malignant neoplasm of breast: Secondary | ICD-10-CM

## 2020-04-01 ENCOUNTER — Other Ambulatory Visit: Payer: Self-pay

## 2020-04-01 ENCOUNTER — Encounter: Payer: Self-pay | Admitting: Cardiology

## 2020-04-01 ENCOUNTER — Ambulatory Visit: Payer: Managed Care, Other (non HMO) | Admitting: Cardiology

## 2020-04-01 VITALS — BP 132/73 | HR 94 | Temp 98.2°F | Ht 65.5 in | Wt 232.0 lb

## 2020-04-01 DIAGNOSIS — R072 Precordial pain: Secondary | ICD-10-CM

## 2020-04-01 DIAGNOSIS — R0602 Shortness of breath: Secondary | ICD-10-CM

## 2020-04-01 DIAGNOSIS — E6609 Other obesity due to excess calories: Secondary | ICD-10-CM

## 2020-04-01 DIAGNOSIS — Z8249 Family history of ischemic heart disease and other diseases of the circulatory system: Secondary | ICD-10-CM

## 2020-04-01 DIAGNOSIS — I209 Angina pectoris, unspecified: Secondary | ICD-10-CM

## 2020-04-01 DIAGNOSIS — E781 Pure hyperglyceridemia: Secondary | ICD-10-CM

## 2020-04-01 MED ORDER — METOPROLOL TARTRATE 25 MG PO TABS: 25.0000 mg | ORAL_TABLET | Freq: Two times a day (BID) | ORAL | 0 refills | Status: DC

## 2020-04-01 MED ORDER — NITROGLYCERIN 0.4 MG SL SUBL: 0.4000 mg | SUBLINGUAL_TABLET | SUBLINGUAL | 0 refills | Status: DC | PRN

## 2020-04-01 NOTE — Progress Notes (Signed)
Date:  04/01/2020   ID:  Stacy [name], DOB May 26, 1975, MRN [id number]  PCP:  [name], NP  Cardiologist:  [name], DO, [facility] (established care 04/01/2020)  REASON FOR CONSULT: Chest Pain and Shortness of Breath  REQUESTING PHYSICIAN:  Myrlene Broker, MD [place],  [place] [address]  Chief Complaint  Patient presents with  . Chest Pain  . Shortness of Breath    HPI  Stacy [name] is a 45 y.o. female who presents to the office with a chief complaint of " chest pain and shortness of breath." Patient's past medical history and cardiovascular risk factors include: Hypothyroidism, hypertriglyceridemia, family history of CAD, postmenopausal female, obesity due to excess calories.  She is referred to the office at the request of Myrlene Broker, MD for evaluation of chest pain and shortness of breath.  Chest pain and shortness of breath: Patient states that both the symptoms started approximately at the same time about 2 months ago.  Patient states that the chest pain has gotten progressively worse.  The discomfort is located substernally, feels like a 8-like sensation, 8 out of 10 in intensity, last for a few hours in duration, no improving or worsening factors.  At times she has noticed chest discomfort with effort related activities and does improve with rest.  The discomfort is also associated with bilateral arm pain left greater than the right.  With regards to shortness of breath symptoms started approximately 2 months ago as well.  Worse with effort related activities improves with rest.  Associated with lower extremity swelling.  Patient states that " feels being tired all over."   No hospitalizations for cardiovascular conditions or symptoms above.  Approximately 1 year ago her sister lost her son and the patient's daughter also lost her child during 22th week of her pregnancy.   Patient states that her salt intake is minimal.   She rarely uses canned goods or frozen foods.  No prior history of congestive heart failure.  No family history of premature coronary disease or sudden cardiac death.  Paternal grandfather passed away less than 36 years of age due to myocardial infarction.  FUNCTIONAL STATUS: Has noticed a decrease in her functional status over the last 2 months.  But still able to take her granddaughter out to the park on a regular basis.  ALLERGIES: No Known Allergies  MEDICATION LIST PRIOR TO VISIT: Current Meds  Medication Sig  . busPIRone (BUSPAR) 15 MG tablet Take 15 mg by mouth 2 (two) times daily.  . clonazePAM (KLONOPIN) 1 MG tablet Take 1 tablet (1 mg total) by mouth at bedtime.  . DULoxetine (CYMBALTA) 30 MG capsule Take 30 mg by mouth at bedtime.  . ergocalciferol (VITAMIN D2) 1.25 MG (50000 UT) capsule Take 1 capsule by mouth once a week.  . fenofibrate 160 MG tablet Take by mouth daily.  . furosemide (LASIX) 20 MG tablet Take 1 tablet by mouth daily as needed.  Marland Kitchen levothyroxine (SYNTHROID) 50 MCG tablet Take 50 mcg by mouth daily before breakfast.  . metoprolol tartrate (LOPRESSOR) 25 MG tablet Take 1 tablet (25 mg total) by mouth 2 (two) times daily. Hold if systolic blood pressure less than 100 mmHg or heart rate less than 60 bpm.  . nitroGLYCERIN (NITROSTAT) 0.4 MG SL tablet Place 1 tablet (0.4 mg total) under the tongue every 5 (five) minutes as needed for chest pain. If you require more than two tablets five minutes apart  go to the nearest ER via EMS.  . pramipexole (MIRAPEX) 1 MG tablet Take 1 mg by mouth at bedtime.     PAST MEDICAL HISTORY: Past Medical History:  Diagnosis Date  . Depressive disorder   . Fibromyalgia   . Hypertriglyceridemia   . Hypoglycemia   . Hypokalemia   . Hypothyroidism   . Migraine   . Numbness and tingling    hands  . RLS (restless legs syndrome)   . Seizures ([place])   . Thyroid disease   . Tremor    hands    PAST SURGICAL HISTORY: Past  Surgical History:  Procedure Laterality Date  . ABDOMINAL HYSTERECTOMY  2005  . 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  SOCIAL HISTORY:  The patient  reports that she has never smoked. She has never used smokeless tobacco. She reports that she does not drink alcohol and does not use drugs.  REVIEW OF SYSTEMS: Review of Systems  Constitutional: Positive for malaise/fatigue. Negative for chills and fever.  HENT: Negative for hoarse voice and nosebleeds.   Eyes: Negative for discharge, double vision and pain.  Cardiovascular: Positive for chest pain, dyspnea on exertion, leg swelling and paroxysmal nocturnal dyspnea. Negative for claudication, near-syncope, orthopnea, palpitations and syncope.  Respiratory: Positive for shortness of breath. Negative for hemoptysis.   Musculoskeletal: Negative for muscle cramps and myalgias.  Gastrointestinal: Negative for abdominal pain, constipation, diarrhea, hematemesis, hematochezia, melena, nausea and vomiting.  Neurological: Negative for dizziness and light-headedness.    PHYSICAL EXAM: Vitals with BMI 04/01/2020 04/01/2020 07/05/2019  Height - 5' 5.5" 5' 5.5"  Weight - 232 lbs 211 lbs  BMI - 33.54 56.25  Systolic 638 937 342  Diastolic 73 80 82  Pulse 94 - 92    CONSTITUTIONAL: Well-developed and well-nourished. No acute distress.  SKIN: Skin is warm and dry. No rash noted. No cyanosis. No pallor. No jaundice HEAD: Normocephalic and atraumatic.  EYES: No scleral icterus MOUTH/THROAT: Moist oral membranes.  NECK: No JVD present. No thyromegaly  noted. No carotid bruits  LYMPHATIC: No visible cervical adenopathy.  CHEST Normal respiratory effort. No intercostal retractions  LUNGS: Clear to auscultation bilaterally.  No stridor. No wheezes. No rales.  CARDIOVASCULAR: Regular rate and rhythm, positive S1-S2, no murmurs rubs or gallops appreciated. ABDOMINAL: No apparent ascites.  EXTREMITIES: No peripheral edema  HEMATOLOGIC: No significant bruising NEUROLOGIC: Oriented to person, place, and time. Nonfocal. Normal muscle tone.  PSYCHIATRIC: Normal mood and affect. Normal behavior. Cooperative  CARDIAC DATABASE: EKG: 04/01/2020: Sinus  Rhythm, 95bpm, normal axis, without underlying injury pattern.   Echocardiogram: No results found for this or any previous visit from the past 1095 days.   Stress Testing: No results found for this or any previous visit from the past 1095 days.  Heart Catheterization: None   LABORATORY DATA: CBC Latest Ref Rng & Units 06/17/2011 02/28/2010 02/20/2010  WBC 4.5 - 10.5 K/uL 3.3(L) 4.3(L) 2.3(L)  Hemoglobin 12.0 - 15.0 g/dL 12.6 12.5 12.3  Hematocrit 36.0 - 46.0 % 37.8 35.8(L) 36.3  Platelets 150.0 - 400.0 K/uL 193.0 198.0 174.0    CMP Latest  Ref Rng & Units 06/17/2011 02/20/2010 08/15/2009  Glucose 70 - 99 mg/dL 81 76 67(L)  BUN 6 - 23 mg/dL 12 12 13   Creatinine 0.4 - 1.2 mg/dL 0.6 0.6 0.6  Sodium 135 - 145 mEq/L 138 140 142  Potassium 3.5 - 5.1 mEq/L 4.1 4.1 3.9  Chloride 96 - 112 mEq/L 103 105 102  CO2 19 - 32 mEq/L 29 31 33(H)  Calcium 8.4 - 10.5 mg/dL 8.8 8.9 9.2  Total Protein 6.0 - 8.3 g/dL 6.8 6.5 7.1  Total Bilirubin 0.3 - 1.2 mg/dL 0.6 0.5 0.6  Alkaline Phos 39 - 117 U/L 47 50 46  AST 0 - 37 U/L 17 23 24   ALT 0 - 35 U/L 13 20 26     Lipid Panel     Component Value Date/Time   CHOL 138 06/17/2011 1026   TRIG 105.0 06/17/2011 1026   HDL 51.00 06/17/2011 1026   CHOLHDL 3 06/17/2011 1026   VLDL 21.0 06/17/2011 1026   LDLCALC 66 06/17/2011 1026    No components found for:  NTPROBNP No results for input(s): PROBNP in the last 8760 hours. Recent Labs    07/05/19 0916  TSH 3.270    BMP No results for input(s): NA, K, CL, CO2, GLUCOSE, BUN, CREATININE, CALCIUM, GFRNONAA, GFRAA in the last 8760 hours.  HEMOGLOBIN A1C Lab Results  Component Value Date   HGBA1C 5.2 07/05/2019   External Labs: Collected: 03/11/2020, [facility] Creatinine 0.86 mg/dL. eGFR: >60 mL/min per 1.73 m Potassium 4.1. Sodium 140. BUN 18. AST 20, ALT 30, alkaline phosphatase 41 BNP 16 Hemoglobin 12.4 g/dL, hematocrit 38%  Lipid profile: Collected: 04/12/2019 [facility] Total cholesterol 224, triglycerides 409, HDL 48, non-HDL 176   IMPRESSION:    ICD-10-CM   1. Angina pectoris (HCC)  I20.9 EKG 12-Lead    PCV ECHOCARDIOGRAM COMPLETE    CT CORONARY MORPH W/CTA COR W/SCORE W/CA W/CM &/OR WO/CM    CT CORONARY FRACTIONAL FLOW RESERVE DATA PREP    CT CORONARY FRACTIONAL FLOW RESERVE FLUID ANALYSIS    metoprolol tartrate (LOPRESSOR) 25 MG tablet    nitroGLYCERIN (NITROSTAT) 0.4 MG SL tablet  2. SOB (shortness of breath)  R06.02 EKG 34-KAJG    Basic metabolic panel    Magnesium  3. Precordial pain  R07.2 CT CORONARY MORPH W/CTA COR W/SCORE W/CA W/CM &/OR WO/CM    CT CORONARY FRACTIONAL FLOW RESERVE DATA PREP    CT CORONARY FRACTIONAL FLOW RESERVE FLUID ANALYSIS  4. Hypertriglyceridemia  E78.1 Lipid Panel With LDL/HDL Ratio    LDL cholesterol, direct  5. Family history of heart disease  Z82.49   6. Class 2 obesity due to excess calories without serious comorbidity with body mass index (BMI) of 38.0 to 38.9 in adult  E66.09    Z68.38      RECOMMENDATIONS: Kiasia Chou is a 45 y.o. female whose past medical history and cardiac risk factors include: Hypothyroidism, hypertriglyceridemia, family history of CAD, postmenopausal female, obesity due to excess calories.  Angina pectoris:  EKG shows normal sinus rhythm without  underlying ischemia or injury pattern.  No active chest pain during today's office visit.  Echocardiogram will be ordered to evaluate for structural heart disease and left ventricular systolic function.  Coronary CTA with CT FFR.  Lopressor 25 mg p.o. twice daily.  Medication profile discussed.  Patient states that her depression is very well controlled and she has more anxiety as compared to depression is willing  to consider being on Lopressor for further upcoming coronary CTA.  Sublingual nitroglycerin tablets to use on as needed basis.  In the interim patient is notified to seek medical attention by going to the closest ER via EMS if her symptoms increase in intensity, frequency, duration, or requires more than 2 sublingual nitroglycerin tablets 5 minutes apart.  Patient verbalizes understanding.  Shortness of breath:  Less likely she has congestive heart failure.  On physical examination she is overall euvolemic.  Her BNP as of March 2022 was within normal limits.  Patient is already on Lasix, continue the same.  Monitor for now.  Hypertriglyceridemia:  Check fasting lipid profile.  Currently on fenofibrate.  Patient states that she will be establishing care with a new primary care provider in April 2022.   As part of this consultation discussed history of present illness, reviewed past medical history, allergies, medications, reviewed outside labs independently that were performed in March 2022 at [facility], discussing disease management, coordination of care, complex decision making.  FINAL MEDICATION LIST END OF ENCOUNTER: Meds ordered this encounter  Medications  . metoprolol tartrate (LOPRESSOR) 25 MG tablet    Sig: Take 1 tablet (25 mg total) by mouth 2 (two) times daily. Hold if systolic blood pressure less than 100 mmHg or heart rate less than 60 bpm.    Dispense:  60 tablet    Refill:  0  . nitroGLYCERIN (NITROSTAT) 0.4 MG SL tablet     Sig: Place 1 tablet (0.4 mg total) under the tongue every 5 (five) minutes as needed for chest pain. If you require more than two tablets five minutes apart go to the nearest ER via EMS.    Dispense:  30 tablet    Refill:  0    Medications Discontinued During This Encounter  Medication Reason  . DULoxetine (CYMBALTA) 60 MG capsule Dose change     Current Outpatient Medications:  .  busPIRone (BUSPAR) 15 MG tablet, Take 15 mg by mouth 2 (two) times daily., Disp: , Rfl:  .  clonazePAM (KLONOPIN) 1 MG tablet, Take 1 tablet (1 mg total) by mouth at bedtime., Disp: 30 tablet, Rfl: 0 .  DULoxetine (CYMBALTA) 30 MG capsule, Take 30 mg by mouth at bedtime., Disp: , Rfl:  .  ergocalciferol (VITAMIN D2) 1.25 MG (50000 UT) capsule, Take 1 capsule by mouth once a week., Disp: , Rfl:  .  fenofibrate 160 MG tablet, Take by mouth daily., Disp: , Rfl:  .  furosemide (LASIX) 20 MG tablet, Take 1 tablet by mouth daily as needed., Disp: , Rfl:  .  levothyroxine (SYNTHROID) 50 MCG tablet, Take 50 mcg by mouth daily before breakfast., Disp: , Rfl:  .  metoprolol tartrate (LOPRESSOR) 25 MG tablet, Take 1 tablet (25 mg total) by mouth 2 (two) times daily. Hold if systolic blood pressure less than 100 mmHg or heart rate less than 60 bpm., Disp: 60 tablet, Rfl: 0 .  nitroGLYCERIN (NITROSTAT) 0.4 MG SL tablet, Place 1 tablet (0.4 mg total) under the tongue every 5 (five) minutes as needed for chest pain. If you require more than two tablets five minutes apart go to the nearest ER via EMS., Disp: 30 tablet, Rfl: 0 .  pramipexole (MIRAPEX) 1 MG tablet, Take 1 mg by mouth at bedtime., Disp: , Rfl:   Orders Placed This Encounter  Procedures  . CT CORONARY MORPH W/CTA COR W/SCORE W/CA W/CM &/OR WO/CM  . CT CORONARY FRACTIONAL FLOW RESERVE  DATA PREP  . CT CORONARY FRACTIONAL FLOW RESERVE FLUID ANALYSIS  . Basic metabolic panel  . Magnesium  . Lipid Panel With LDL/HDL Ratio  . LDL cholesterol, direct  . EKG 12-Lead  .  PCV ECHOCARDIOGRAM COMPLETE    There are no Patient Instructions on file for this visit.   --Continue cardiac medications as reconciled in final medication list. --Return in about 3 weeks (around 04/22/2020) for Follow up, Chest pain, Dyspnea. Or sooner if needed. --Continue follow-up with your primary care physician regarding the management of your other chronic comorbid conditions.  Patient's questions and concerns were addressed to her satisfaction. She voices understanding of the instructions provided during this encounter.   This note was created using a voice recognition software as a result there may be grammatical errors inadvertently enclosed that do not reflect the nature of this encounter. Every attempt is made to correct such errors.  [name], [place], [facility]  Pager: [phone] Office: [phone]

## 2020-04-05 ENCOUNTER — Other Ambulatory Visit: Payer: Self-pay

## 2020-04-05 DIAGNOSIS — R0602 Shortness of breath: Secondary | ICD-10-CM

## 2020-04-05 LAB — MAGNESIUM: Magnesium: 2.1 mg/dL (ref 1.6–2.3)

## 2020-04-05 LAB — BASIC METABOLIC PANEL
BUN/Creatinine Ratio: 22 (ref 9–23)
BUN: 19 mg/dL (ref 6–24)
CO2: 21 mmol/L (ref 20–29)
Calcium: 9.2 mg/dL (ref 8.7–10.2)
Chloride: 101 mmol/L (ref 96–106)
Creatinine, Ser: 0.88 mg/dL (ref 0.57–1.00)
Glucose: 78 mg/dL (ref 65–99)
Potassium: 4.3 mmol/L (ref 3.5–5.2)
Sodium: 140 mmol/L (ref 134–144)
eGFR: 83 mL/min/{1.73_m2} (ref >59)

## 2020-04-05 LAB — LIPID PANEL WITH LDL/HDL RATIO
Cholesterol, Total: 165 mg/dL (ref 100–199)
HDL: 59 mg/dL (ref >39)
LDL Chol Calc (NIH): 83 mg/dL (ref 0–99)
LDL/HDL Ratio: 1.4 ratio (ref 0.0–3.2)
Triglycerides: 130 mg/dL (ref 0–149)
VLDL Cholesterol Cal: 23 mg/dL (ref 5–40)

## 2020-04-05 LAB — LDL CHOLESTEROL, DIRECT: LDL Direct: 80 mg/dL (ref 0–99)

## 2020-04-08 ENCOUNTER — Telehealth (HOSPITAL_COMMUNITY): Payer: Self-pay | Admitting: *Deleted

## 2020-04-08 NOTE — Telephone Encounter (Signed)
Reaching out to patient to offer assistance regarding upcoming cardiac imaging study; pt verbalizes understanding of appt date/time, parking situation and where to check in, pre-test NPO status and medications ordered, and verified current allergies; name and call back number provided for further questions should they arise  Jalessa Peyser RN Navigator Cardiac Imaging [facility] [phone] office [phone] cell  

## 2020-04-09 ENCOUNTER — Ambulatory Visit (HOSPITAL_COMMUNITY)
Admission: RE | Admit: 2020-04-09 | Discharge: 2020-04-09 | Disposition: A | Discharge status: Home / Self Care | Payer: Managed Care, Other (non HMO) | Source: Ambulatory Visit | Attending: Cardiology | Admitting: Cardiology

## 2020-04-09 ENCOUNTER — Other Ambulatory Visit: Payer: Self-pay

## 2020-04-09 DIAGNOSIS — I209 Angina pectoris, unspecified: Secondary | ICD-10-CM | POA: Diagnosis present

## 2020-04-09 DIAGNOSIS — R0600 Dyspnea, unspecified: Secondary | ICD-10-CM | POA: Insufficient documentation

## 2020-04-09 DIAGNOSIS — R072 Precordial pain: Secondary | ICD-10-CM | POA: Insufficient documentation

## 2020-04-09 DIAGNOSIS — Z006 Encounter for examination for normal comparison and control in clinical research program: Secondary | ICD-10-CM

## 2020-04-09 DIAGNOSIS — I25119 Atherosclerotic heart disease of native coronary artery with unspecified angina pectoris: Secondary | ICD-10-CM | POA: Diagnosis not present

## 2020-04-09 MED ORDER — NITROGLYCERIN 0.4 MG SL SUBL
0.8000 mg | SUBLINGUAL_TABLET | Freq: Once | SUBLINGUAL | Status: AC | PRN
  Administered 2020-04-09: 0.8 mg via SUBLINGUAL

## 2020-04-09 MED ORDER — NITROGLYCERIN 0.4 MG SL SUBL
SUBLINGUAL_TABLET | SUBLINGUAL | Status: AC
  Filled 2020-04-09: qty 1

## 2020-04-09 MED ORDER — IOHEXOL 350 MG/ML SOLN
80.0000 mL | Freq: Once | INTRAVENOUS | Status: AC | PRN
  Administered 2020-04-09: 80 mL via INTRAVENOUS

## 2020-04-09 NOTE — Research (Signed)
IDENTIFY Informed Consent   Subject Name: Stacy [name]  Subject met inclusion and exclusion criteria.  The informed consent form, study requirements and expectations were reviewed with the subject and questions and concerns were addressed prior to the signing of the consent form.  The subject verbalized understanding of the trial requirements.  The subject agreed to participate in the IDENTIFY trial and signed the informed consent at 0730 on 05/APR/2022.  The informed consent was obtained prior to performance of any protocol-specific procedures for the subject.  A copy of the signed informed consent was given to the subject and a copy was placed in the subject's medical record.   [name]

## 2020-04-09 NOTE — Progress Notes (Signed)
CT cardiac imaging completed. Provided with light snack after procedure. PIV removed and discharged to lobby with family.

## 2020-04-10 ENCOUNTER — Ambulatory Visit (HOSPITAL_COMMUNITY)
Admission: RE | Admit: 2020-04-10 | Discharge: 2020-04-10 | Disposition: A | Discharge status: Home / Self Care | Payer: Managed Care, Other (non HMO) | Source: Ambulatory Visit | Attending: Cardiology | Admitting: Cardiology

## 2020-04-10 DIAGNOSIS — R072 Precordial pain: Secondary | ICD-10-CM | POA: Diagnosis present

## 2020-04-10 DIAGNOSIS — I209 Angina pectoris, unspecified: Secondary | ICD-10-CM | POA: Insufficient documentation

## 2020-04-11 ENCOUNTER — Other Ambulatory Visit: Payer: Self-pay

## 2020-04-11 ENCOUNTER — Other Ambulatory Visit: Payer: Managed Care, Other (non HMO)

## 2020-04-11 ENCOUNTER — Ambulatory Visit: Payer: Managed Care, Other (non HMO)

## 2020-04-11 ENCOUNTER — Telehealth: Payer: Self-pay

## 2020-04-11 DIAGNOSIS — I209 Angina pectoris, unspecified: Secondary | ICD-10-CM

## 2020-04-11 NOTE — Telephone Encounter (Signed)
From patient.

## 2020-04-12 NOTE — Telephone Encounter (Signed)
Spoke to the patient extensively yesterday on the phone on 04/11/2020.  Reviewed the coronary calcium score, results of the coronary CTA, and also CT FFR results.  She was very thankful for the call back and verbalized understanding.

## 2020-04-16 ENCOUNTER — Other Ambulatory Visit: Payer: Managed Care, Other (non HMO)

## 2020-04-16 NOTE — Progress Notes (Signed)
Date:  04/17/2020   ID:  Stacy [name], DOB July 18, 1975, MRN [id number]  PCP:  Stacy Bradley, NP  Cardiologist: Stacy Kras, DO, [facility] (established care 04/01/2020)  Date: 04/17/20 Last Office Visit: 04/01/2020  Chief Complaint  Patient presents with  . Shortness of Breath  . Follow-up  . Results    HPI  Stacy [name] is a 45 y.o. female who presents to the office with a chief complaint of " shortness of breath and review test results." Patient's past medical history and cardiovascular risk factors include: Nonobstructive CAD (coronary CTA 04/2020), hypothyroidism, hypertriglyceridemia, family history of CAD, postmenopausal female, obesity due to excess calories.  She is referred to the office at the request of Stacy, [name], NP for evaluation of chest pain and shortness of breath.  During initial consultation at the last visit her symptoms are suggestive of angina pectoris and therefore was recommended to undergo echocardiogram and coronary CTA with FFR to evaluate for structural heart disease and obstructive CAD respectively.  Since then patient had undergone an echocardiogram which notes preserved LVEF without significant valvular heart disease.  The coronary CTA notes a total coronary calcium score of 0 with moderate stenosis in the distal LAD due to eccentric noncalcified plaque.  Patient underwent CT FFR which noted no significant stenosis within the LAD distribution.  She presents for 1 month follow-up to discuss results and to reevaluate symptoms.  Results reviewed with her in great detail including the images of the coronary CTA during today's encounter.  Patient states her chest pain is resolved but has residual shortness of breath.  I suggest that her shortness of breath is most likely noncardiac as she is not in congestive heart failure and last BNP was within normal limits.  I have asked her to discuss noncardiac causes of shortness of breath with her  provider and if needed consider pulmonary evaluation.  Approximately 1 year ago her sister lost her son and the patient's daughter also lost her child during 67th week of pregnancy.   Patient states that her salt intake is minimal.  She rarely uses canned goods or frozen foods.  No prior history of congestive heart failure.  No family history of premature coronary disease or sudden cardiac death.  Paternal grandfather passed away less than 67 years of age due to myocardial infarction.  FUNCTIONAL STATUS: Has noticed a decrease in her functional status over the last 2 months.  But still able to take her granddaughter out to the park on a regular basis.  ALLERGIES: No Known Allergies  MEDICATION LIST PRIOR TO VISIT: Current Meds  Medication Sig  . aspirin EC 81 MG tablet Take 1 tablet (81 mg total) by mouth daily. Swallow whole.  . busPIRone (BUSPAR) 15 MG tablet Take 15 mg by mouth 2 (two) times daily.  . clonazePAM (KLONOPIN) 1 MG tablet Take 1 tablet (1 mg total) by mouth at bedtime.  . DULoxetine (CYMBALTA) 30 MG capsule Take 30 mg by mouth at bedtime.  . ergocalciferol (VITAMIN D2) 1.25 MG (50000 UT) capsule Take 1 capsule by mouth once a week.  . fenofibrate 160 MG tablet Take by mouth daily.  . furosemide (LASIX) 20 MG tablet Take 1 tablet by mouth daily as needed.  Marland Kitchen levothyroxine (SYNTHROID) 50 MCG tablet Take 50 mcg by mouth daily before breakfast.  . nitroGLYCERIN (NITROSTAT) 0.4 MG SL tablet Place 1 tablet (0.4 mg total) under the tongue every 5 (five) minutes as needed for chest pain. If you  require more than two tablets five minutes apart go to the nearest ER via EMS.  . pramipexole (MIRAPEX) 1 MG tablet Take 1 mg by mouth at bedtime.  . [DISCONTINUED] metoprolol tartrate (LOPRESSOR) 25 MG tablet Take 1 tablet (25 mg total) by mouth 2 (two) times daily. Hold if systolic blood pressure less than 100 mmHg or heart rate less than 60 bpm.     PAST MEDICAL HISTORY: Past  Medical History:  Diagnosis Date  . Depressive disorder   . Fibromyalgia   . Hypertriglyceridemia   . Hypoglycemia   . Hypokalemia   . Hypothyroidism   . Migraine   . Nonobstructive atherosclerosis of coronary artery   . Numbness and tingling    hands  . RLS (restless legs syndrome)   . Seizures ([place])   . Thyroid disease   . Tremor    hands    PAST SURGICAL HISTORY: Past Surgical History:  Procedure Laterality Date  . ABDOMINAL HYSTERECTOMY  2005  . 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  SOCIAL HISTORY:  The patient  reports that she has never smoked. She has never used smokeless tobacco. She reports that she does not drink alcohol and does not use drugs.  REVIEW OF SYSTEMS: Review of Systems  Constitutional: Positive for malaise/fatigue. Negative for chills and fever.  HENT: Negative for hoarse voice and nosebleeds.   Eyes: Negative for discharge, double vision and pain.  Cardiovascular: Positive for chest pain, dyspnea on exertion, leg swelling and paroxysmal nocturnal dyspnea. Negative for claudication, near-syncope, orthopnea, palpitations and syncope.  Respiratory: Positive for shortness of breath. Negative for hemoptysis.   Musculoskeletal: Negative for muscle cramps and myalgias.  Gastrointestinal: Negative for abdominal pain, constipation, diarrhea, hematemesis, hematochezia, melena, nausea and vomiting.  Neurological: Negative for dizziness and light-headedness.    PHYSICAL EXAM: Vitals with BMI 04/17/2020 04/09/2020 04/09/2020  Height 5' 5"  - -  Weight 229 lbs -  -  BMI 40.98 - -  Systolic 119 147 829  Diastolic 78 45 57  Pulse 77 63 63    CONSTITUTIONAL: Well-developed and well-nourished. No acute distress.  SKIN: Skin is warm and dry. No rash noted. No cyanosis. No pallor. No jaundice HEAD: Normocephalic and atraumatic.  EYES: No scleral icterus MOUTH/THROAT: Moist oral membranes.  NECK: No JVD present. No thyromegaly noted. No carotid bruits  LYMPHATIC: No visible cervical adenopathy.  CHEST Normal respiratory effort. No intercostal retractions  LUNGS: Clear to auscultation bilaterally.  No stridor. No wheezes. No rales.  CARDIOVASCULAR: Regular rate and rhythm, positive S1-S2, no murmurs rubs or gallops appreciated. ABDOMINAL: No apparent ascites.  EXTREMITIES: No peripheral edema  HEMATOLOGIC: No significant bruising NEUROLOGIC: Oriented to person, place, and time. Nonfocal. Normal muscle tone.  PSYCHIATRIC: Normal mood and affect. Normal behavior. Cooperative  CARDIAC DATABASE: EKG: 04/01/2020: Sinus  Rhythm, 95bpm, normal axis, without underlying injury pattern.   Echocardiogram: 04/11/2020: Left ventricle cavity is normal in size and wall thickness. Normal LV systolic function with EF 74%. Normal global wall motion. Normal diastolic filling pattern. Mild (Grade I) mitral regurgitation. Mild tricuspid regurgitation. No evidence of pulmonary hypertension.  Stress Testing: No results found for this or any previous visit from the past 1095 days.  Heart Catheterization: None   Coronary CTA 04/09/2020 1. Coronary calcium score of 0. 2. Normal coronary origin with left dominance. 3. CAD-RADS = 3. Moderate stenosis (50-69%) in distal LAD due to an eccentric noncalcified plaque. Overall accuracy is limited due to artifact involving apical LAD and LCx distribution. 4. Study is sent for CT-FFR to further evaluate the distal LAD. CT FFR analysis showed no significant stenosis. 5. Hepatic steatosis.  LABORATORY DATA: CBC Latest Ref Rng &  Units 06/17/2011 02/28/2010 02/20/2010  WBC 4.5 - 10.5 K/uL 3.3(L) 4.3(L) 2.3(L)  Hemoglobin 12.0 - 15.0 g/dL 12.6 12.5 12.3  Hematocrit 36.0 - 46.0 % 37.8 35.8(L) 36.3  Platelets 150.0 - 400.0 K/uL 193.0 198.0 174.0    CMP Latest Ref Rng & Units 04/04/2020 06/17/2011 02/20/2010  Glucose 65 - 99 mg/dL 78 81 76  BUN 6 - 24 mg/dL 19 12 12   Creatinine 0.57 - 1.00 mg/dL 0.88 0.6 0.6  Sodium 134 - 144 mmol/L 140 138 140  Potassium 3.5 - 5.2 mmol/L 4.3 4.1 4.1  Chloride 96 - 106 mmol/L 101 103 105  CO2 20 - 29 mmol/L 21 29 31   Calcium 8.7 - 10.2 mg/dL 9.2 8.8 8.9  Total Protein 6.0 - 8.3 g/dL - 6.8 6.5  Total Bilirubin 0.3 - 1.2 mg/dL - 0.6 0.5  Alkaline Phos 39 - 117 U/L - 47 50  AST 0 - 37 U/L - 17 23  ALT 0 - 35 U/L - 13 20    Lipid Panel     Component Value Date/Time   CHOL 165 04/04/2020 1304   TRIG 130 04/04/2020 1304   HDL 59 04/04/2020 1304   CHOLHDL 3 06/17/2011 1026   VLDL 21.0 06/17/2011 1026   LDLCALC 83 04/04/2020 1304   LDLDIRECT 80 04/04/2020 1304   LABVLDL 23 04/04/2020 1304    No components found for: NTPROBNP No results for input(s): PROBNP in the last 8760 hours. Recent Labs    07/05/19 0916  TSH 3.270    BMP Recent Labs    04/04/20 1304  NA 140  K 4.3  CL 101  CO2 21  GLUCOSE 78  BUN 19  CREATININE 0.88  CALCIUM 9.2    HEMOGLOBIN A1C Lab Results  Component Value Date   HGBA1C 5.2 07/05/2019   External Labs: Collected: 03/11/2020, [facility] Creatinine 0.86 mg/dL. eGFR: >60 mL/min per 1.73 m Potassium 4.1. Sodium 140. BUN 18. AST 20, ALT 30, alkaline phosphatase 41 BNP 16 Hemoglobin 12.4 g/dL, hematocrit 38%  Lipid profile: Collected: 04/12/2019 [facility] Total cholesterol 224, triglycerides 409, HDL 48, non-HDL 176  Lab Results  Component Value Date   CHOL 165 04/04/2020   HDL 59 04/04/2020   LDLCALC 83 04/04/2020   LDLDIRECT 80 04/04/2020   TRIG 130 04/04/2020   CHOLHDL 3  06/17/2011    IMPRESSION:    ICD-10-CM   1. Atherosclerosis of native coronary artery of native heart without angina pectoris  I25.10 aspirin EC 81 MG tablet  2. Angina pectoris (HCC)  I20.9   3. SOB (shortness of breath)  R06.02   4. Precordial pain  R07.2   5. Hypertriglyceridemia  E78.1   6. Family history of heart disease  Z82.49   7. Class 2 obesity due to excess calories without serious comorbidity with body mass index (BMI) of 38.0 to 38.9 in adult  E66.09  Z61.09      RECOMMENDATIONS: Holly Iannaccone is a 45 y.o. female whose past medical history and cardiac risk factors include: Nonobstructive CAD (coronary CTA 04/2020), hypothyroidism, hypertriglyceridemia, family history of CAD, postmenopausal female, obesity due to excess calories.  Nonobstructive CAD without angina pectoris:  Echocardiogram notes preserved LVEF without any significant valvular heart disease.  She underwent a coronary CTA for symptoms suggestive angina pectoris since last office visit which noted around 50% stenosis in the distal LAD due to an eccentric noncalcified plaque.  CT FFR did not show it to be hemodynamically significant.  Recommend aggressive lifestyle modifications with improving her modifiable cardiovascular risk factors.  Given the soft plaque in the distal LAD which is not hemodynamically significant per CT FFR this shared decision was to initiate aspirin 81 mg p.o. daily as long as she remains a low bleeding risk.  Given the fact that her coronary calcium score is 0, she has made significant improvement with her lipid profile from April 2021 as compared to 03/2020, and her estimated 10-year risk of ASCVD is 0.4% therefore no statin therapy is recommended at this time.   Shortness of breath: Chronic and stable  Less likely she has congestive heart failure.  On physical examination she is overall euvolemic.  Her BNP as of March 2022 was within normal limits.  Patient is already on  Lasix, continue the same.  Monitor for now.  Hypertriglyceridemia:  Independently reviewed the lipid profile from 03/2020 with the patient at today's office visit.  Patient's triglyceride levels have improved significantly compared to 04/2019.  Currently on fenofibrate.  Patient states that she will be establishing care with a new primary care provider in April 2022.   FINAL MEDICATION LIST END OF ENCOUNTER: Meds ordered this encounter  Medications  . aspirin EC 81 MG tablet    Sig: Take 1 tablet (81 mg total) by mouth daily. Swallow whole.    Dispense:  90 tablet    Refill:  3    Medications Discontinued During This Encounter  Medication Reason  . metoprolol tartrate (LOPRESSOR) 25 MG tablet Discontinued by provider     Current Outpatient Medications:  .  aspirin EC 81 MG tablet, Take 1 tablet (81 mg total) by mouth daily. Swallow whole., Disp: 90 tablet, Rfl: 3 .  busPIRone (BUSPAR) 15 MG tablet, Take 15 mg by mouth 2 (two) times daily., Disp: , Rfl:  .  clonazePAM (KLONOPIN) 1 MG tablet, Take 1 tablet (1 mg total) by mouth at bedtime., Disp: 30 tablet, Rfl: 0 .  DULoxetine (CYMBALTA) 30 MG capsule, Take 30 mg by mouth at bedtime., Disp: , Rfl:  .  ergocalciferol (VITAMIN D2) 1.25 MG (50000 UT) capsule, Take 1 capsule by mouth once a week., Disp: , Rfl:  .  fenofibrate 160 MG tablet, Take by mouth daily., Disp: , Rfl:  .  furosemide (LASIX) 20 MG tablet, Take 1 tablet by mouth daily as needed., Disp: , Rfl:  .  levothyroxine (SYNTHROID) 50 MCG tablet, Take 50 mcg by mouth daily before breakfast., Disp: , Rfl:  .  nitroGLYCERIN (NITROSTAT) 0.4 MG SL tablet, Place 1 tablet (0.4 mg total) under the tongue every 5 (five) minutes as needed for chest pain. If you require more than two tablets five minutes apart go to the nearest ER via EMS., Disp: 30 tablet, Rfl: 0 .  pramipexole (MIRAPEX) 1 MG tablet, Take 1 mg by mouth at bedtime., Disp: , Rfl:   No orders of the defined  types were  placed in this encounter.  There are no Patient Instructions on file for this visit.   --Continue cardiac medications as reconciled in final medication list. --Return in 6 months (on 10/17/2020) for Follow up, CAD. Or sooner if needed. --Continue follow-up with your primary care physician regarding the management of your other chronic comorbid conditions.  Patient's questions and concerns were addressed to her satisfaction. She voices understanding of the instructions provided during this encounter.   This note was created using a voice recognition software as a result there may be grammatical errors inadvertently enclosed that do not reflect the nature of this encounter. Every attempt is made to correct such errors.  Stacy [name], [place], [facility]  Pager: [phone] Office: [phone]

## 2020-04-17 ENCOUNTER — Encounter: Payer: Self-pay | Admitting: Cardiology

## 2020-04-17 ENCOUNTER — Other Ambulatory Visit: Payer: Self-pay

## 2020-04-17 ENCOUNTER — Ambulatory Visit: Payer: Managed Care, Other (non HMO) | Admitting: Cardiology

## 2020-04-17 VITALS — BP 110/78 | HR 77 | Temp 97.9°F | Resp 16 | Ht 65.0 in | Wt 229.0 lb

## 2020-04-17 DIAGNOSIS — I209 Angina pectoris, unspecified: Secondary | ICD-10-CM

## 2020-04-17 DIAGNOSIS — R0602 Shortness of breath: Secondary | ICD-10-CM

## 2020-04-17 DIAGNOSIS — E6609 Other obesity due to excess calories: Secondary | ICD-10-CM

## 2020-04-17 DIAGNOSIS — I2510 Atherosclerotic heart disease of native coronary artery without angina pectoris: Secondary | ICD-10-CM

## 2020-04-17 DIAGNOSIS — Z8249 Family history of ischemic heart disease and other diseases of the circulatory system: Secondary | ICD-10-CM

## 2020-04-17 DIAGNOSIS — E781 Pure hyperglyceridemia: Secondary | ICD-10-CM

## 2020-04-17 DIAGNOSIS — R072 Precordial pain: Secondary | ICD-10-CM

## 2020-04-17 DIAGNOSIS — Z6838 Body mass index (BMI) 38.0-38.9, adult: Secondary | ICD-10-CM

## 2020-04-17 MED ORDER — ASPIRIN EC 81 MG PO TBEC: 81.0000 mg | DELAYED_RELEASE_TABLET | Freq: Every day | ORAL | 3 refills | Status: AC

## 2020-04-22 ENCOUNTER — Ambulatory Visit: Payer: Managed Care, Other (non HMO) | Admitting: Cardiology

## 2020-05-16 ENCOUNTER — Ambulatory Visit: Payer: Managed Care, Other (non HMO) | Admitting: Cardiology

## 2020-05-16 ENCOUNTER — Ambulatory Visit: Payer: Managed Care, Other (non HMO)

## 2020-05-16 ENCOUNTER — Encounter: Payer: Self-pay | Admitting: Cardiology

## 2020-05-16 ENCOUNTER — Other Ambulatory Visit: Payer: Self-pay

## 2020-05-16 VITALS — BP 119/75 | HR 99 | Temp 98.7°F | Resp 17 | Ht 65.0 in | Wt 231.8 lb

## 2020-05-16 DIAGNOSIS — I2510 Atherosclerotic heart disease of native coronary artery without angina pectoris: Secondary | ICD-10-CM

## 2020-05-16 DIAGNOSIS — R200 Anesthesia of skin: Secondary | ICD-10-CM

## 2020-05-16 DIAGNOSIS — G43709 Chronic migraine without aura, not intractable, without status migrainosus: Secondary | ICD-10-CM

## 2020-05-16 DIAGNOSIS — Z8249 Family history of ischemic heart disease and other diseases of the circulatory system: Secondary | ICD-10-CM

## 2020-05-16 DIAGNOSIS — R202 Paresthesia of skin: Secondary | ICD-10-CM

## 2020-05-16 DIAGNOSIS — I2583 Coronary atherosclerosis due to lipid rich plaque: Secondary | ICD-10-CM

## 2020-05-16 DIAGNOSIS — E7800 Pure hypercholesterolemia, unspecified: Secondary | ICD-10-CM

## 2020-05-16 MED ORDER — ATORVASTATIN CALCIUM 10 MG PO TABS: 10.0000 mg | ORAL_TABLET | Freq: Every day | ORAL | 6 refills | Status: DC

## 2020-05-16 NOTE — Progress Notes (Addendum)
Primary Physician/Referring:  [name], NP  Patient ID: Stacy [name], female    DOB: 28-Sep-1975, 45 y.o.   MRN: [id number]  Chief Complaint  Patient presents with  . Coronary Artery Disease    6 month   HPI:    Stacy [name]  is a 45 y.o. Caucasian female patient with fibromyalgia, migraine headaches, nonobstructive coronary atherosclerosis by coronary CTA on 04/09/2020 with very mild soft plaque in the distal LAD, hypothyroidism, hyperlipidemia who had seen my partner Dr. Terri Skains on 04/17/2020 for evaluation of chest pain. She was recommended aggressive risk factor modification.  She now presents for a 53-monthoffice visit.  She is accompanied by her husband.  States that she is very concerned about stroke and CV events.  About 2 weeks ago when she woke up she had severe tingling and numbness in her whole left side of the body that lasted for 2-3 minutes associated with tremors.  This resolved but she has been concerned about ongoing tingling and numbness that occurs randomly on her feet and hands.  She also endorses migraine headaches that get severe especially during summertime without aura.  She has no chest pain, but does admit to sedentary lifestyle. She denies leg edema, PND or orthopnea, symptoms of claudication.  Past Medical History:  Diagnosis Date  . Depressive disorder   . Fibromyalgia   . Hypertriglyceridemia   . Hypoglycemia   . Hypokalemia   . Hypothyroidism   . Migraine   . Nonobstructive atherosclerosis of coronary artery   . Numbness and tingling    hands  . RLS (restless legs syndrome)   . Seizures ([place]   . Thyroid disease   . Tremor    hands   Past Surgical History:  Procedure Laterality Date  . ABDOMINAL HYSTERECTOMY  2005  . BREAST BIOPSY Left 09/27/2014   PASH   Family History  Problem Relation Age of Onset  . Diabetes Mother   . Hyperlipidemia Mother   . Hypertension Mother   . Thyroid disease Mother   . Bladder Cancer  Father   . Stroke Father   . Hyperlipidemia Father   . Heart disease Father   . Hypothyroidism Father   . Hypertension Father   . Colon cancer Maternal Uncle   . Graves' disease Sister   . Heart attack Paternal Grandfather   . Breast cancer Neg Hx     Social History   Tobacco Use  . Smoking status: Never Smoker  . Smokeless tobacco: Never Used  Substance Use Topics  . Alcohol use: Never   Marital Status: Married   ROS  Review of Systems  Constitutional: Positive for malaise/fatigue.  Cardiovascular: Negative for chest pain, dyspnea on exertion and leg swelling.  Gastrointestinal: Negative for melena.  Neurological: Positive for headaches, numbness and paresthesias.  Psychiatric/Behavioral: Positive for depression. The patient is nervous/anxious.    Objective  Blood pressure 119/75, pulse 99, temperature 98.7 F (37.1 C), temperature source Temporal, resp. rate 17, height 5' 5"  (1.651 m), weight 231 lb 12.8 oz (105.1 kg), SpO2 98 %. Body mass index is 38.57 kg/m.  Vitals with BMI 05/16/2020 04/17/2020 04/09/2020  Height 5' 5"  5' 5"  -  Weight 231 lbs 13 oz 229 lbs -  BMI 378.29356.21-  Systolic [id number] Diastolic 75 78 45  Pulse 99 77 63     Physical Exam  Constitutional: No distress.  Morbidly obese  Eyes: Conjunctivae are normal.  Neck: No JVD present.  Cardiovascular: Regular rhythm, normal heart sounds, intact distal pulses and normal pulses. Exam reveals no gallop.  No murmur heard. Pulmonary/Chest: Effort normal and breath sounds normal. She exhibits no tenderness.  Abdominal: Soft. Bowel sounds are normal.  Musculoskeletal:        General: No edema. Normal range of motion.     Cervical back: Neck supple.  Neurological: She is alert and oriented to person, place, and time.  Skin: Skin is warm.     Laboratory examination:   Recent Labs    04/04/20 1304  NA 140  K 4.3  CL 101  CO2 21  GLUCOSE 78  BUN 19  CREATININE 0.88  CALCIUM 9.2   CrCl  cannot be calculated (Patient's most recent lab result is older than the maximum 21 days allowed.).  CMP Latest Ref Rng & Units 04/04/2020 06/17/2011 02/20/2010  Glucose 65 - 99 mg/dL 78 81 76  BUN 6 - 24 mg/dL 19 12 12   Creatinine 0.57 - 1.00 mg/dL 0.88 0.6 0.6  Sodium 134 - 144 mmol/L 140 138 140  Potassium 3.5 - 5.2 mmol/L 4.3 4.1 4.1  Chloride 96 - 106 mmol/L 101 103 105  CO2 20 - 29 mmol/L 21 29 31   Calcium 8.7 - 10.2 mg/dL 9.2 8.8 8.9  Total Protein 6.0 - 8.3 g/dL - 6.8 6.5  Total Bilirubin 0.3 - 1.2 mg/dL - 0.6 0.5  Alkaline Phos 39 - 117 U/L - 47 50  AST 0 - 37 U/L - 17 23  ALT 0 - 35 U/L - 13 20   CBC Latest Ref Rng & Units 06/17/2011 02/28/2010 02/20/2010  WBC 4.5 - 10.5 K/uL 3.3(L) 4.3(L) 2.3(L)  Hemoglobin 12.0 - 15.0 g/dL 12.6 12.5 12.3  Hematocrit 36.0 - 46.0 % 37.8 35.8(L) 36.3  Platelets 150.0 - 400.0 K/uL 193.0 198.0 174.0    Lipid Panel Recent Labs    04/04/20 1304  CHOL 165  TRIG 130  LDLCALC 83  HDL 59  LDLDIRECT 80   Lipid Panel     Component Value Date/Time   CHOL 165 04/04/2020 1304   TRIG 130 04/04/2020 1304   HDL 59 04/04/2020 1304   CHOLHDL 3 06/17/2011 1026   VLDL 21.0 06/17/2011 1026   LDLCALC 83 04/04/2020 1304   LDLDIRECT 80 04/04/2020 1304   LABVLDL 23 04/04/2020 1304     Lipid Panel     Component Value Date/Time   CHOL 165 04/04/2020 1304   TRIG 130 04/04/2020 1304   HDL 59 04/04/2020 1304   CHOLHDL 3 06/17/2011 1026   VLDL 21.0 06/17/2011 1026   LDLCALC 83 04/04/2020 1304   LDLDIRECT 80 04/04/2020 1304   LABVLDL 23 04/04/2020 1304     HEMOGLOBIN A1C Lab Results  Component Value Date   HGBA1C 5.2 07/05/2019   TSH Recent Labs    07/05/19 0916  TSH 3.270    External labs:    External Labs: Collected: 03/11/2020, [facility] Creatinine 0.86 mg/dL. eGFR: >60 mL/min per 1.73 m Potassium 4.1. Sodium 140. BUN 18. AST 20, ALT 30, alkaline phosphatase 41 BNP 16 Hemoglobin 12.4 g/dL, hematocrit  38%  Lipid profile: Collected: 04/12/2019 [facility] Total cholesterol 224, triglycerides 409, HDL 48, non-HDL 176  Medications and allergies  No Known Allergies   Current Outpatient Medications on File Prior to Visit  Medication Sig Dispense Refill  . aspirin EC 81 MG tablet Take 1 tablet (81 mg total) by mouth daily. Swallow whole. [place]  tablet 3  . busPIRone (BUSPAR) 30 MG tablet Take 30 mg by mouth 2 (two) times daily.    . clonazePAM (KLONOPIN) 1 MG tablet Take 1 tablet (1 mg total) by mouth at bedtime. 30 tablet 0  . DULoxetine (CYMBALTA) 60 MG capsule Take 60 mg by mouth daily.    . ergocalciferol (VITAMIN D2) 1.25 MG (50000 UT) capsule Take 1 capsule by mouth once a week.    . fenofibrate 160 MG tablet Take by mouth daily.    . furosemide (LASIX) 20 MG tablet Take 40 mg by mouth daily as needed.    . gabapentin (NEURONTIN) 300 MG capsule Take 2 capsules by mouth daily.    Marland Kitchen levothyroxine (SYNTHROID) 50 MCG tablet Take 50 mcg by mouth daily before breakfast.    . nitroGLYCERIN (NITROSTAT) 0.4 MG SL tablet Place 1 tablet (0.4 mg total) under the tongue every 5 (five) minutes as needed for chest pain. If you require more than two tablets five minutes apart go to the nearest ER via EMS. 30 tablet 0  . pramipexole (MIRAPEX) 1 MG tablet Take 1 mg by mouth at bedtime.     No current facility-administered medications on file prior to visit.     Radiology:   No results found.  Cardiac Studies:   Echocardiogram: 04/11/2020: Left ventricle cavity is normal in size and wall thickness. Normal LV systolic function with EF 74%. Normal global wall motion. Normal diastolic filling pattern. Mild (Grade I) mitral regurgitation. Mild tricuspid regurgitation. No evidence of pulmonary hypertension.   Stress Testing: No results found for this or any previous visit from the past 1095 days.  Heart Catheterization: None   Coronary CTA 04/09/2020 1. Coronary calcium score of  0. 2. Normal coronary origin with left dominance. 3. CAD-RADS = 3. Moderate stenosis (50-69%) in distal LAD due to an eccentric noncalcified plaque. Overall accuracy is limited due to artifact involving apical LAD and LCx distribution. 4. Study is sent for CT-FFR to further evaluate the distal LAD. CT FFR analysis showed no significant stenosis. 5. Hepatic steatosis.  EKG:     EKG 04/01/2020: Sinus  Rhythm, 95bpm, normal axis, without underlying injury pattern.     Assessment     ICD-10-CM   1. Coronary arteriosclerosis due to lipid rich plaque  I25.10    I25.83   2. Family history of heart disease  Z82.49   3. Hypercholesteremia  E78.00 atorvastatin (LIPITOR) 10 MG tablet    Lipid Panel With LDL/HDL Ratio    Lipid Panel With LDL/HDL Ratio  4. Numbness and tingling of left arm and leg  R20.0 PCV CAROTID DUPLEX (BILATERAL)   R20.2 Ambulatory referral to Neurology  5. Chronic migraine without aura without status migrainosus, not intractable  G43.709 Ambulatory referral to Neurology     Medications Discontinued During This Encounter  Medication Reason  . DULoxetine (CYMBALTA) 30 MG capsule Error  . busPIRone (BUSPAR) 15 MG tablet Error    Meds ordered this encounter  Medications  . atorvastatin (LIPITOR) 10 MG tablet    Sig: Take 1 tablet (10 mg total) by mouth daily.    Dispense:  30 tablet    Refill:  6   Orders Placed This Encounter  Procedures  . Lipid Panel With LDL/HDL Ratio    Standing Status:   Future    Number of Occurrences:   1    Standing Expiration Date:   05/16/2021  . Ambulatory referral to Neurology    Referral Priority:  Routine    Referral Type:   Consultation    Referral Reason:   Specialty Services Required    Referred to Provider:   Penni Bombard, MD    Requested Specialty:   Neurology    Number of Visits Requested:   1   Recommendations:   Ismerai Bin is a 45 y.o. Caucasian female patient with fibromyalgia, migraine headaches,  nonobstructive coronary atherosclerosis by coronary CTA on 04/09/2020 with very mild soft plaque in the distal LAD, hypothyroidism, hyperlipidemia who had seen my partner Dr. Terri Skains on 04/17/2020 for evaluation of chest pain. She was recommended aggressive risk factor modification.  She now presents for a 95-monthoffice visit.  States that she is very concerned about stroke and CV events.  About 2 weeks ago when she woke up she had severe tingling and numbness in her whole left side of the body that lasted for about 2-3 minutes associated with tremors.  She also has chronic migraines that are more frequent during summer without aura, not intractable.  After review of her medical records, her recent labs, I have advised her that continued primary prevention would be most appropriate with weight loss and exercise.  Her lipids have improved significantly. In 2021 her LDL was markedly elevated and so was non-HDL at 176 however the present lipid profile is markedly improved with improvement in triglycerides and also LDL.    Overall however in view of moderate atherosclerotic soft plaque in the coronary arteries, I have recommended that we switch her from fenofibric acid to Lipitor 10 mg daily.  We will recheck lipids in 3 months.  For now we could continue aspirin however can discontinue in 3 to 6 months when she is on a statin, I suspect there will be significant plaque regression.  Otherwise her risk factors are well controlled except for morbid obesity.  Primary prevention extensively discussed with the patient and her husband at the bedside.  With regard to tingling and numbness, she is concerned about TIA.  Will obtain carotid artery duplex.  I will also make a referral to see Dr. PLeta Baptistwho had seen her before in the past.  I would like to see her back in 6 months for follow-up.  This was a 40-minute office visit encounter in discussions regarding multiple cardiovascular risk factors, and review of  previously performed test.    JAdrian Prows MD, FNorthlake Endoscopy Center5/12/2020, 3:29 PM Office: [phone]

## 2020-05-17 NOTE — Progress Notes (Signed)
Minimal soft plaque right ICA. No further eval indicated.  Continue primary prevention. Discussed with patient personally

## 2020-05-20 NOTE — Telephone Encounter (Signed)
From patient.

## 2020-06-24 ENCOUNTER — Ambulatory Visit: Payer: Managed Care, Other (non HMO) | Admitting: Diagnostic Neuroimaging

## 2020-06-24 ENCOUNTER — Encounter: Payer: Self-pay | Admitting: Diagnostic Neuroimaging

## 2020-06-24 VITALS — BP 133/81 | HR 97 | Ht 65.0 in | Wt 234.0 lb

## 2020-06-24 DIAGNOSIS — R251 Tremor, unspecified: Secondary | ICD-10-CM

## 2020-06-24 DIAGNOSIS — G43009 Migraine without aura, not intractable, without status migrainosus: Secondary | ICD-10-CM

## 2020-06-24 DIAGNOSIS — R519 Headache, unspecified: Secondary | ICD-10-CM | POA: Diagnosis not present

## 2020-06-24 DIAGNOSIS — R200 Anesthesia of skin: Secondary | ICD-10-CM

## 2020-06-24 MED ORDER — TOPIRAMATE 50 MG PO TABS: 50.0000 mg | ORAL_TABLET | Freq: Two times a day (BID) | ORAL | 12 refills | Status: DC

## 2020-06-24 MED ORDER — RIZATRIPTAN BENZOATE 10 MG PO TBDP: 10.0000 mg | ORAL_TABLET | ORAL | 11 refills | Status: DC | PRN

## 2020-06-24 NOTE — Progress Notes (Signed)
[facility]  PATIENT: Stacy [name] DOB: [birth year]  REFERRING CLINICIAN: Adrian Prows, MD HISTORY FROM: patient  REASON FOR VISIT: new consult    HISTORICAL  CHIEF COMPLAINT:  Chief Complaint  Patient presents with   Numbness, tingling    Rm 6 "episode 2 months ago of shaking, numbness, tingling down my left side, EKG at urgent care said small heart attack; saw cardiology, was sent here; migraines on left side now with N/V, tingling not relieved by Excedrin Migraine; they come on easily"     HISTORY OF PRESENT ILLNESS:   UPDATE (06/24/20, [facility]): Since last visit, patient here for evaluation of numbness, headaches.  2 months ago patient had episode of tremors and left-sided tingling affecting left face, arm and leg.  She had some nausea associate with this.  She saw cardiology who recommended neurology follow-up for TIA evaluation.  Symptoms lasted several hours and then resolved.  Also with ongoing migraine headaches 1 to 2/week associated with nausea and sensitivity to light.  Since last visit had an EMG which showed bilateral carpal tunnel syndrome, but she has not gone back to hand surgeon.  PRIOR HPI ([id number]): 45 year old female here for evaluation of tremors, numbness, headaches.  Patient has had tremors in hands for more than 1 year.  Symptoms worse with stress or anxiety.  Tremor mainly present with certain postures or holding objects.  Also has numbness and tingling in hands for 6 to 12 months.  Also has history of headaches with throbbing sensation, tingling sensation, nausea, photophobia, exertional quality, since fourth grade.   REVIEW OF SYSTEMS: Full 14 system review of systems performed and negative with exception of: as per HPI.   ALLERGIES: No Known Allergies  HOME MEDICATIONS: Outpatient Medications Prior to Visit  Medication Sig Dispense Refill   aspirin EC 81 MG tablet Take 1 tablet (81 mg total) by mouth daily. Swallow whole. 90  tablet 3   aspirin-acetaminophen-caffeine (EXCEDRIN MIGRAINE) 263-785-88 MG tablet Take by mouth every 6 (six) hours as needed for headache.     atorvastatin (LIPITOR) 10 MG tablet Take 1 tablet (10 mg total) by mouth daily. 30 tablet 6   busPIRone (BUSPAR) 30 MG tablet Take 30 mg by mouth 2 (two) times daily.     clonazePAM (KLONOPIN) 1 MG tablet Take 1 tablet (1 mg total) by mouth at bedtime. 30 tablet 0   DULoxetine (CYMBALTA) 60 MG capsule Take 60 mg by mouth daily.     fenofibrate 160 MG tablet Take by mouth daily.     furosemide (LASIX) 20 MG tablet Take 40 mg by mouth daily as needed.     gabapentin (NEURONTIN) 300 MG capsule Take 2 capsules by mouth daily.     levothyroxine (SYNTHROID) 50 MCG tablet Take 50 mcg by mouth daily before breakfast.     pramipexole (MIRAPEX) 1 MG tablet Take 1 mg by mouth at bedtime.     ergocalciferol (VITAMIN D2) 1.25 MG (50000 UT) capsule Take 1 capsule by mouth once a week. (Patient not taking: Reported on 06/24/2020)     nitroGLYCERIN (NITROSTAT) 0.4 MG SL tablet Place 1 tablet (0.4 mg total) under the tongue every 5 (five) minutes as needed for chest pain. If you require more than two tablets five minutes apart go to the nearest ER via EMS. 30 tablet 0   No facility-administered medications prior to visit.    PAST MEDICAL HISTORY: Past Medical History:  Diagnosis Date   Depressive disorder  Fibromyalgia    Hypertriglyceridemia    Hypoglycemia    Hypokalemia    Hypothyroidism    Migraine    Nonobstructive atherosclerosis of coronary artery    Numbness and tingling    hands   RLS (restless legs syndrome)    Seizures (HCC)    x 1 in school   Thyroid disease    Tremor    hands    PAST SURGICAL HISTORY: Past Surgical History:  Procedure Laterality Date   ABDOMINAL HYSTERECTOMY  2005   BREAST BIOPSY Left 09/27/2014   PASH    FAMILY HISTORY: Family History  Problem Relation Age of Onset   Diabetes Mother    Hyperlipidemia Mother     Hypertension Mother    Thyroid disease Mother    Bladder Cancer Father    Stroke Father    Hyperlipidemia Father    Heart disease Father    Hypothyroidism Father    Hypertension Father    Colon cancer Maternal Uncle    Graves' disease Sister    Heart attack Paternal Grandfather    Breast cancer Neg Hx     SOCIAL HISTORY: Social History   Socioeconomic History   Marital status: Married    Spouse name: Not on file   Number of children: 2   Years of education: 12   Highest education level: Not on file  Occupational History   Not on file  Tobacco Use   Smoking status: Never   Smokeless tobacco: Never  Vaping Use   Vaping Use: Never used  Substance and Sexual Activity   Alcohol use: Never   Drug use: Never   Sexual activity: Yes    Birth control/protection: Surgical  Other Topics Concern   Not on file  Social History Narrative   Married   Regular exercise-yes   Does not work outside the home   Caffeine 1-2 daily   Social Determinants of Health   Financial Resource Strain: Not on file  Food Insecurity: Not on file  Transportation Needs: Not on file  Physical Activity: Not on file  Stress: Not on file  Social Connections: Not on file  Intimate Partner Violence: Not on file     PHYSICAL EXAM  GENERAL EXAM/CONSTITUTIONAL: Vitals:  Vitals:   06/24/20 1533  BP: 133/81  Pulse: 97  Weight: 234 lb (106.1 kg)  Height: 5\' 5"  (1.651 m)   Body mass index is 38.94 kg/m. Wt Readings from Last 3 Encounters:  06/24/20 234 lb (106.1 kg)  05/16/20 231 lb 12.8 oz (105.1 kg)  04/17/20 229 lb (103.9 kg)   Patient is in no distress; well developed, nourished and groomed; neck is supple  CARDIOVASCULAR: Examination of carotid arteries is normal; no carotid bruits Regular rate and rhythm, no murmurs Examination of peripheral vascular system by observation and palpation is normal  EYES: Ophthalmoscopic exam of optic discs and posterior segments is normal; no  papilledema or hemorrhages No results found.  MUSCULOSKELETAL: Gait, strength, tone, movements noted in Neurologic exam below  NEUROLOGIC: MENTAL STATUS:  No flowsheet data found. awake, alert, oriented to person, place and time recent and remote memory intact normal attention and concentration language fluent, comprehension intact, naming intact fund of knowledge appropriate  CRANIAL NERVE:  2nd - no papilledema on fundoscopic exam 2nd, 3rd, 4th, 6th - pupils equal and reactive to light, visual fields full to confrontation, extraocular muscles intact, no nystagmus 5th - facial sensation symmetric 7th - facial strength symmetric 8th - hearing intact 9th -  palate elevates symmetrically, uvula midline 11th - shoulder shrug symmetric 12th - tongue protrusion midline  MOTOR:  MILD POSTURAL TREMOR IN BUE normal bulk and tone, full strength in the BUE, BLE SLIGHTLY FLATTENING OF BILATERAL APB  SENSORY:  normal and symmetric to light touch, pinprick, temperature, vibration; SLIGHTLY DECR PP IN BILATERAL FINGERS DIGITS 1-4  COORDINATION:  finger-nose-finger, fine finger movements normal  REFLEXES:  deep tendon reflexes 2+ and symmetric  GAIT/STATION:  narrow based gait     DIAGNOSTIC DATA (LABS, IMAGING, TESTING) - I reviewed patient records, labs, notes, testing and imaging myself where available.  Lab Results  Component Value Date   WBC 3.3 (L) 06/17/2011   HGB 12.6 06/17/2011   HCT 37.8 06/17/2011   MCV 92.5 06/17/2011   PLT 193.0 06/17/2011      Component Value Date/Time   NA 140 04/04/2020 1304   K 4.3 04/04/2020 1304   CL 101 04/04/2020 1304   CO2 21 04/04/2020 1304   GLUCOSE 78 04/04/2020 1304   GLUCOSE 81 06/17/2011 1026   BUN 19 04/04/2020 1304   CREATININE 0.88 04/04/2020 1304   CALCIUM 9.2 04/04/2020 1304   PROT 6.8 06/17/2011 1026   ALBUMIN 4.0 06/17/2011 1026   AST 17 06/17/2011 1026   ALT 13 06/17/2011 1026   ALKPHOS 47 06/17/2011 1026    BILITOT 0.6 06/17/2011 1026   GFRNONAA 112.28 08/15/2009 1321   Lab Results  Component Value Date   CHOL 165 04/04/2020   HDL 59 04/04/2020   LDLCALC 83 04/04/2020   LDLDIRECT 80 04/04/2020   TRIG 130 04/04/2020   CHOLHDL 3 06/17/2011   Lab Results  Component Value Date   HGBA1C 5.2 07/05/2019   Lab Results  Component Value Date   ZYSAYTKZ60 109 07/05/2019   Lab Results  Component Value Date   TSH 3.270 07/05/2019     04/11/20 TTE Echocardiogram 04/11/2020:  Left ventricle cavity is normal in size and wall thickness. Normal LV  systolic function with EF 74%. Normal global wall motion. Normal diastolic  filling pattern.  Mild (Grade I) mitral regurgitation.  Mild tricuspid regurgitation.  No evidence of pulmonary hypertension.   05/16/20 carotid u/s - Doppler velocity suggests stenosis in the right internal carotid artery  (minimal). Mild homogenous plaque noted in the mid and distal ICA.  - Peak systolic velocities in the left bifurcation, internal, external and common carotid arteries are within normal limits.  - Antegrade right vertebral artery flow. Antegrade left vertebral artery flow.    ASSESSMENT AND PLAN  45 y.o. year old female here with:   Dx:  1. New onset of headaches   2. Hand numbness   3. Tremor   4. Migraine without aura and without status migrainosus, not intractable       PLAN:  INCREASED HEADACHES / TREMORS / LEFT SIDED NUMBNESS - check MRI brain (MS, TIA workup; TIA less likely)  HAND NUMBNESS (bilateral CTS) - follow up with Dr. Fredna Dow for bilateral carpal tunnel syndrome   MIGRAINE WITHOUT AURA  MIGRAINE PREVENTION  LIFESTYLE CHANGES -Stop or avoid smoking -Decrease or avoid caffeine / alcohol -Eat and sleep on a regular schedule -Exercise several times per week - start topiramate 50mg  at bedtime; after 1-2 weeks increase to 50mg  twice a day; drink plenty of water  MIGRAINE RESCUE  - ibuprofen, tylenol as needed -  rizatriptan (Maxalt) 10mg  as needed for breakthrough headache; may repeat x 1 after 2 hours; max 2 tabs per day or 8  per month  Orders Placed This Encounter  Procedures   MR BRAIN W WO CONTRAST   Meds ordered this encounter  Medications   topiramate (TOPAMAX) 50 MG tablet    Sig: Take 1 tablet (50 mg total) by mouth 2 (two) times daily.    Dispense:  60 tablet    Refill:  12   rizatriptan (MAXALT-MLT) 10 MG disintegrating tablet    Sig: Take 1 tablet (10 mg total) by mouth as needed for migraine. May repeat in 2 hours if needed    Dispense:  9 tablet    Refill:  11   Return in about 4 months (around 10/24/2020).    Penni Bombard, MD 8/90/2284, 0:69 PM Certified in Neurology, Neurophysiology and Neuroimaging  [facility] [address] [place], [place] [address] [phone]

## 2020-06-24 NOTE — Patient Instructions (Signed)
INCREASED HEADACHES / TREMORS / LEFT SIDED NUMBNESS - check MRI brain   HAND NUMBNESS (bilateral CTS) - follow up with Dr. Fredna Dow for bilateral carpal tunnel syndrome   MIGRAINE WITHOUT AURA  MIGRAINE PREVENTION  LIFESTYLE CHANGES -Stop or avoid smoking -Decrease or avoid caffeine / alcohol -Eat and sleep on a regular schedule -Exercise several times per week - start topiramate 50mg  at bedtime; after 1-2 weeks increase to 50mg  twice a day; drink plenty of water   MIGRAINE RESCUE  - ibuprofen, tylenol as needed - rizatriptan (Maxalt) 10mg  as needed for breakthrough headache; may repeat x 1 after 2 hours; max 2 tabs per day or 8 per month

## 2020-06-25 ENCOUNTER — Telehealth: Payer: Self-pay | Admitting: Diagnostic Neuroimaging

## 2020-06-25 NOTE — Telephone Encounter (Signed)
MRI brain w/wo contrast: GI obtains auth ([employer])  Sent to GI for scheduling

## 2020-06-28 ENCOUNTER — Telehealth: Payer: Self-pay | Admitting: Diagnostic Neuroimaging

## 2020-06-28 ENCOUNTER — Emergency Department (HOSPITAL_COMMUNITY)
Admission: EM | Admit: 2020-06-28 | Discharge: 2020-06-28 | Disposition: A | Discharge status: Home / Self Care | Payer: Managed Care, Other (non HMO) | Attending: Emergency Medicine | Admitting: Emergency Medicine

## 2020-06-28 ENCOUNTER — Emergency Department (HOSPITAL_COMMUNITY): Payer: Managed Care, Other (non HMO)

## 2020-06-28 ENCOUNTER — Encounter (HOSPITAL_COMMUNITY): Payer: Self-pay | Admitting: Emergency Medicine

## 2020-06-28 DIAGNOSIS — R202 Paresthesia of skin: Secondary | ICD-10-CM | POA: Insufficient documentation

## 2020-06-28 DIAGNOSIS — Z7982 Long term (current) use of aspirin: Secondary | ICD-10-CM | POA: Insufficient documentation

## 2020-06-28 DIAGNOSIS — E039 Hypothyroidism, unspecified: Secondary | ICD-10-CM | POA: Insufficient documentation

## 2020-06-28 DIAGNOSIS — R200 Anesthesia of skin: Secondary | ICD-10-CM

## 2020-06-28 DIAGNOSIS — Z79899 Other long term (current) drug therapy: Secondary | ICD-10-CM | POA: Diagnosis not present

## 2020-06-28 LAB — COMPREHENSIVE METABOLIC PANEL
ALT: 42 U/L (ref 0–44)
AST: 27 U/L (ref 15–41)
Albumin: 4.2 g/dL (ref 3.5–5.0)
Alkaline Phosphatase: 53 U/L (ref 38–126)
Anion gap: 6 (ref 5–15)
BUN: 14 mg/dL (ref 6–20)
CO2: 27 mmol/L (ref 22–32)
Calcium: 9.1 mg/dL (ref 8.9–10.3)
Chloride: 105 mmol/L (ref 98–111)
Creatinine, Ser: 0.98 mg/dL (ref 0.44–1.00)
GFR, Estimated: >60 mL/min (ref >60)
Glucose, Bld: 96 mg/dL (ref 70–99)
Potassium: 3.8 mmol/L (ref 3.5–5.1)
Sodium: 138 mmol/L (ref 135–145)
Total Bilirubin: 0.5 mg/dL (ref 0.3–1.2)
Total Protein: 7.4 g/dL (ref 6.5–8.1)

## 2020-06-28 LAB — CBC WITH DIFFERENTIAL/PLATELET
Abs Immature Granulocytes: 0.03 10*3/uL (ref 0.00–0.07)
Basophils Absolute: 0 10*3/uL (ref 0.0–0.1)
Basophils Relative: 1 %
Eosinophils Absolute: 0.1 10*3/uL (ref 0.0–0.5)
Eosinophils Relative: 2 %
HCT: 38.3 % (ref 36.0–46.0)
Hemoglobin: 12.5 g/dL (ref 12.0–15.0)
Immature Granulocytes: 1 %
Lymphocytes Relative: 29 %
Lymphs Abs: 1.8 10*3/uL (ref 0.7–4.0)
MCH: 30.3 pg (ref 26.0–34.0)
MCHC: 32.6 g/dL (ref 30.0–36.0)
MCV: 93 fL (ref 80.0–100.0)
Monocytes Absolute: 0.5 10*3/uL (ref 0.1–1.0)
Monocytes Relative: 8 %
Neutro Abs: 3.5 10*3/uL (ref 1.7–7.7)
Neutrophils Relative %: 59 %
Platelets: 379 10*3/uL (ref 150–400)
RBC: 4.12 MIL/uL (ref 3.87–5.11)
RDW: 12.5 % (ref 11.5–15.5)
WBC: 6 10*3/uL (ref 4.0–10.5)
nRBC: 0 % (ref 0.0–0.2)

## 2020-06-28 LAB — MAGNESIUM: Magnesium: 2.1 mg/dL (ref 1.7–2.4)

## 2020-06-28 LAB — CBG MONITORING, ED: Glucose-Capillary: 93 mg/dL (ref 70–99)

## 2020-06-28 MED ORDER — GADOBUTROL 1 MMOL/ML IV SOLN
10.0000 mL | Freq: Once | INTRAVENOUS | Status: AC | PRN
  Administered 2020-06-28: 10 mL via INTRAVENOUS

## 2020-06-28 NOTE — ED Notes (Signed)
Pt returned from MRI °

## 2020-06-28 NOTE — ED Triage Notes (Signed)
Pt here from home with c/o numbness and tigling to th eleft side of her face arms and leags , pt has a MRI scheduled for next Wednesday , symptoms ongoing for 2 months

## 2020-06-28 NOTE — Telephone Encounter (Signed)
Patient called with new onset left face numbness (onset 10-15 minutes ago); stronger than prior events. Slight right face numbness also. No weakness, headache or slurred speech. MRI setup for next week. I advised to go to ER for expedited eval and workup (MRI brain w/wo).    Penni Bombard, MD 9/48/5462, 70:35 PM Certified in Neurology, Neurophysiology and Neuroimaging  [facility] [address] [place], [place] [address] [phone]

## 2020-06-28 NOTE — ED Provider Notes (Signed)
[facility] EMERGENCY DEPARTMENT Provider Note   CSN: [id number] Arrival date & time: 06/28/20  1305     History No chief complaint on file.   Stacy [name] is a 45 y.o. female.  The history is provided by the patient and medical records. No language interpreter was used.   45 year old female significant history of depression, fibromyalgia, restless leg syndrome, thyroid disease, presenting to the ER with complaints of numbness and tingling sensation.  Patient report for the past several months she has had intermittent episodes of numbness and tingling sensation.  States that today while she was at the store she experiencing left-sided facial numbness and tingling down her left arm and as well as her left foot.  Symptoms happen sporadically and has since resolved.  No associated headache but she does endorse history of migraine.  She denies any change in vision no slurred speech no difficulty swallowing or having shortness of breath.  She denies any associated pain.  She admits that she is experiencing increasing stress after losing many family members within the past years.  She also has history of migraine and recently started on Topamax.  She was last seen by her neurologist and does have an appointment to have a brain MRI on 620 09/2020.  She did reach out to her neurologist today and was recommended to come to the ER for further evaluation.  No prior history of MS.  At this time patient states her symptom has resolved.    Past Medical History:  Diagnosis Date   Depressive disorder    Fibromyalgia    Hypertriglyceridemia    Hypoglycemia    Hypokalemia    Hypothyroidism    Migraine    Nonobstructive atherosclerosis of coronary artery    Numbness and tingling    hands   RLS (restless legs syndrome)    Seizures (Between)    x 1 in school   Thyroid disease    Tremor    hands    Patient Active Problem List   Diagnosis Date Noted   Angina pectoris ([place])     Precordial pain    Chest tightness 03/24/2013   Acute URI 03/24/2013   RESTLESS LEG SYNDROME 08/15/2009   DEPRESSIVE DISORDER 09/26/2008   Unspecified hypothyroidism 08/03/2008    Past Surgical History:  Procedure Laterality Date   ABDOMINAL HYSTERECTOMY  2005   BREAST BIOPSY Left 09/27/2014   [place]     OB History   No obstetric history on file.     Family History  Problem Relation Age of Onset   Diabetes Mother    Hyperlipidemia Mother    Hypertension Mother    Thyroid disease Mother    Bladder Cancer Father    Stroke Father    Hyperlipidemia Father    Heart disease Father    Hypothyroidism Father    Hypertension Father    Colon cancer Maternal Uncle    Graves' disease Sister    Heart attack Paternal Grandfather    Breast cancer Neg Hx     Social History   Tobacco Use   Smoking status: Never   Smokeless tobacco: Never  Vaping Use   Vaping Use: Never used  Substance Use Topics   Alcohol use: Never   Drug use: Never    Home Medications Prior to Admission medications   Medication Sig Start Date End Date Taking? Authorizing Provider  aspirin EC 81 MG tablet Take 1 tablet (81 mg total) by mouth daily. Swallow whole.  04/17/20   Tolia, Sunit, DO  aspirin-acetaminophen-caffeine (EXCEDRIN MIGRAINE) [phone] MG tablet Take by mouth every 6 (six) hours as needed for headache.    [provider]  atorvastatin (LIPITOR) 10 MG tablet Take 1 tablet (10 mg total) by mouth daily. 05/16/20 12/12/20  Adrian Prows, MD  busPIRone (BUSPAR) 30 MG tablet Take 30 mg by mouth 2 (two) times daily. 04/19/20   [provider]  clonazePAM (KLONOPIN) 1 MG tablet Take 1 tablet (1 mg total) by mouth at bedtime. 09/25/13   Janith Lima, MD  DULoxetine (CYMBALTA) 60 MG capsule Take 60 mg by mouth daily. 05/15/20   [provider]  ergocalciferol (VITAMIN D2) 1.25 MG (50000 UT) capsule Take 1 capsule by mouth once a week. Patient not taking: Reported on 06/24/2020  04/13/19   [provider]  fenofibrate 160 MG tablet Take by mouth daily. 04/13/19   [provider]  furosemide (LASIX) 20 MG tablet Take 40 mg by mouth daily as needed. 03/12/20   [provider]  gabapentin (NEURONTIN) 300 MG capsule Take 2 capsules by mouth daily. 04/22/20   [provider]  levothyroxine (SYNTHROID) 50 MCG tablet Take 50 mcg by mouth daily before breakfast.    [provider]  nitroGLYCERIN (NITROSTAT) 0.4 MG SL tablet Place 1 tablet (0.4 mg total) under the tongue every 5 (five) minutes as needed for chest pain. If you require more than two tablets five minutes apart go to the nearest ER via EMS. 04/01/20 05/01/20  Tolia, Sunit, DO  pramipexole (MIRAPEX) 1 MG tablet Take 1 mg by mouth at bedtime. 01/04/19   [provider]  rizatriptan (MAXALT-MLT) 10 MG disintegrating tablet Take 1 tablet (10 mg total) by mouth as needed for migraine. May repeat in 2 hours if needed 06/24/20   Penumalli, Earlean Polka, MD  topiramate (TOPAMAX) 50 MG tablet Take 1 tablet (50 mg total) by mouth 2 (two) times daily. 06/24/20   Penumalli, Earlean Polka, MD    Allergies    Patient has no known allergies.  Review of Systems   Review of Systems  All other systems reviewed and are negative.  Physical Exam Updated Vital Signs BP (!) 147/84 (BP Location: Right Arm)   Pulse (!) 105   Temp 98.7 F (37.1 C)   Resp 17   SpO2 100%   Physical Exam Vitals and nursing note reviewed.  Constitutional:      General: She is not in acute distress.    Appearance: She is well-developed.  HENT:     Head: Atraumatic.  Eyes:     Extraocular Movements: Extraocular movements intact.     Conjunctiva/sclera: Conjunctivae normal.     Pupils: Pupils are equal, round, and reactive to light.  Cardiovascular:     Rate and Rhythm: Normal rate and regular rhythm.     Pulses: Normal pulses.     Heart sounds: Normal heart sounds.  Pulmonary:     Effort: Pulmonary effort is  normal.  Abdominal:     Palpations: Abdomen is soft.     Tenderness: There is no abdominal tenderness.  Musculoskeletal:     Cervical back: Normal range of motion and neck supple.  Skin:    Findings: No rash.  Neurological:     Mental Status: She is alert and oriented to person, place, and time.     GCS: GCS eye subscore is 4. GCS verbal subscore is 5. GCS motor subscore is 6.     Cranial  Nerves: Cranial nerves are intact.     Sensory: Sensation is intact.     Motor: Motor function is intact.     Coordination: Coordination is intact.     Gait: Gait is intact.     Deep Tendon Reflexes: Reflexes are normal and symmetric.     Reflex Scores:      Patellar reflexes are 2+ on the right side and 2+ on the left side. Psychiatric:        Mood and Affect: Mood normal.    ED Results / Procedures / Treatments   Labs (all labs ordered are listed, but only abnormal results are displayed) Labs Reviewed  CBC WITH DIFFERENTIAL/PLATELET  COMPREHENSIVE METABOLIC PANEL  MAGNESIUM  CBG MONITORING, ED    EKG None  Date: 06/28/2020  Rate: 103  Rhythm: sinus tachycardia  QRS Axis: normal  Intervals: normal  ST/T Wave abnormalities: normal  Conduction Disutrbances: none  Narrative Interpretation:   Old EKG Reviewed: No significant changes noted    Radiology No results found.  Procedures Procedures   Medications Ordered in ED Medications - No data to display  ED Course  I have reviewed the triage vital signs and the nursing notes.  Pertinent labs & imaging results that were available during my care of the patient were reviewed by me and considered in my medical decision making (see chart for details).    MDM Rules/Calculators/A&P                          BP 123/61   Pulse 81   Temp 98.7 F (37.1 C)   Resp 18   SpO2 100%   Final Clinical Impression(s) / ED Diagnoses Final diagnoses:  None    Rx / DC Orders ED Discharge Orders     None      3:14 PM Patient  with intermittent episodes of left-sided facial numbness and left arm numbness that has been happening sporadically for the past several months.  Was seen by neurologist and does have an MRI of the brain scheduled in the next several days.  There is a note coming from her neurologist, to be evaluated in the ED and for brain MRI to obtain.  At this time she does not have any focal neurodeficit and is resting comfortably.  Work-up initiated.  5:17 PM Labs are reassuring, currently awaits brain MRI.  Patient resting comfortably and does not have any active symptoms at this time.  6:26 PM Pt sign out to Dr. Ronnald Nian who will f/u on MRI result and determine disposition.    Domenic Moras, PA-C 06/28/20 1828    Lennice Sites, DO 06/28/20 2041

## 2020-06-28 NOTE — ED Provider Notes (Signed)
Emergency Medicine Provider Triage Evaluation Note  Stacy [name] , a 45 y.o. female  was evaluated in triage.  Pt complains of left-sided face and extremity paresthesias that began while she was walking around at [employer].  I reviewed her chart and this has been happening over the course of the past couple of months.  She states that the episodes vary in frequency/week and duration.  She denies any previous diagnosis of complex migraines, but does state that she was recently started on Topamax.  Her paresthesias/tingling is like hitting funny bone... sensation is still intact.  She has a brain MRI scheduled for 07/03/2020 but she called her doctor and they advised her to come to ED.    Review of Systems  Positive: Extremity paresthesias Negative: HA, blurred vision, syncope, weakness  Physical Exam  BP (!) 147/84 (BP Location: Right Arm)   Pulse (!) 105   Temp 98.7 F (37.1 C)   Resp 17   SpO2 100%  Gen:   Awake, no distress   Resp:  Normal effort  MSK:   Moves extremities without difficulty  Other:  CN II through XII grossly intact.  No weakness or numbness.  Medical Decision Making  Medically screening exam initiated at 1:31 PM.  Appropriate orders placed.  Miara Emminger was informed that the remainder of the evaluation will be completed by another provider, this initial triage assessment does not replace that evaluation, and the importance of remaining in the ED until their evaluation is complete.    Corena Herter, PA-C 06/28/20 1331    Quintella Reichert, MD 06/28/20 1335

## 2020-06-30 NOTE — Progress Notes (Signed)
Cardiology Office Note   Date:  07/01/2020   ID:  Prabhnoor Ellenberger, DOB 1975/11/01, MRN [id number]  PCP:  [name], NP    No chief complaint on file.  Nonobstructive CAD  Wt Readings from Last 3 Encounters:  07/01/20 235 lb 6.4 oz (106.8 kg)  06/24/20 234 lb (106.1 kg)  05/16/20 231 lb 12.8 oz (105.1 kg)       History of Present Illness: Irvin Lizama is a 45 y.o. female  who saw Dr. Einar Gip.  She requested transfer to [facility].  Prior records from 05/2020 show:  "45 y.o. Caucasian female patient with fibromyalgia, migraine headaches, nonobstructive coronary atherosclerosis by coronary CTA on 04/09/2020 with very mild soft plaque in the distal LAD, hypothyroidism, hyperlipidemia who had seen my partner Dr. Terri Skains on 04/17/2020 for evaluation of chest pain. She was recommended aggressive risk factor modification.  She now presents for a 76-month office visit.   She is accompanied by her husband.  States that she is very concerned about stroke and CV events.  About 2 weeks ago when she woke up she had severe tingling and numbness in her whole left side of the body that lasted for 2-3 minutes associated with tremors.  This resolved but she has been concerned about ongoing tingling and numbness that occurs randomly on her feet and hands.  She also endorses migraine headaches that get severe especially during summertime without aura.   She has no chest pain, but does admit to sedentary lifestyle. She denies leg edema, PND or orthopnea, symptoms of claudication."  Cardiac tests from Dr. Einar Gip: "Echocardiogram: 04/11/2020: Left ventricle cavity is normal in size and wall thickness. Normal LV systolic function with EF 74%. Normal global wall motion. Normal diastolic filling pattern. Mild (Grade I) mitral regurgitation. Mild tricuspid regurgitation. No evidence of pulmonary hypertension.   Stress Testing: No results found for this or any previous visit from the past 1095  days.   Heart Catheterization: None   Coronary CTA 04/09/2020 1. Coronary calcium score of 0. 2. Normal coronary origin with left dominance. 3. CAD-RADS = 3. Moderate stenosis (50-69%) in distal LAD due to an eccentric noncalcified plaque. Overall accuracy is limited due to artifact involving apical LAD and LCx distribution. 4. Study is sent for CT-FFR to further evaluate the distal LAD. CT FFR analysis showed no significant stenosis. 5. Hepatic steatosis."  On 6/24, she had facial tingling, numbness.  She called her neuro MD and she was instructed to go to ER.  She had her MRI and it was negative.  Sx have improved.    She reports daily tingling.  She feels that something is wrong.  SHe has some issues with shaking.  She has chronic shortness of breath but does not report any exertional chest discomfort.  She has had some stress in the family in the past.   Does not report Syncope.      Past Medical History:  Diagnosis Date   Depressive disorder    Fibromyalgia    Hypertriglyceridemia    Hypoglycemia    Hypokalemia    Hypothyroidism    Migraine    Nonobstructive atherosclerosis of coronary artery    Numbness and tingling    hands   RLS (restless legs syndrome)    Seizures (HCC)    x 1 in school   Thyroid disease    Tremor    hands    Past Surgical History:  Procedure Laterality Date   ABDOMINAL HYSTERECTOMY  2005   BREAST BIOPSY Left 09/27/2014   [facility]     Current Outpatient Medications  Medication Sig Dispense Refill   aspirin EC 81 MG tablet Take 1 tablet (81 mg total) by mouth daily. Swallow whole. 90 tablet 3   atorvastatin (LIPITOR) 10 MG tablet Take 1 tablet (10 mg total) by mouth daily. 30 tablet 6   busPIRone (BUSPAR) 30 MG tablet Take 30 mg by mouth 2 (two) times daily.     clonazePAM (KLONOPIN) 1 MG tablet Take 1 tablet (1 mg total) by mouth at bedtime. 30 tablet 0   DULoxetine (CYMBALTA) 60 MG capsule Take 60 mg by mouth at bedtime.     fenofibrate  160 MG tablet Take 160 mg by mouth at bedtime.     furosemide (LASIX) 40 MG tablet Take 40 mg by mouth daily as needed for fluid.     gabapentin (NEURONTIN) 300 MG capsule Take 300 mg by mouth 2 (two) times daily.     levothyroxine (SYNTHROID) 50 MCG tablet Take 50 mcg by mouth daily before breakfast.     pramipexole (MIRAPEX) 1 MG tablet Take 1 mg by mouth at bedtime.     rizatriptan (MAXALT-MLT) 10 MG disintegrating tablet Take 1 tablet (10 mg total) by mouth as needed for migraine. May repeat in 2 hours if needed 9 tablet 11   topiramate (TOPAMAX) 50 MG tablet Take 1 tablet (50 mg total) by mouth 2 (two) times daily. 60 tablet 12   nitroGLYCERIN (NITROSTAT) 0.4 MG SL tablet Place 1 tablet (0.4 mg total) under the tongue every 5 (five) minutes as needed for chest pain. If you require more than two tablets five minutes apart go to the nearest ER via EMS. 30 tablet 0   No current facility-administered medications for this visit.    Allergies:   Patient has no known allergies.    Social History:  The patient  reports that she has never smoked. She has never used smokeless tobacco. She reports that she does not drink alcohol and does not use drugs.   Family History:  The patient's family history includes Bladder Cancer in her father; Colon cancer in her maternal uncle; Diabetes in her mother; Berenice Primas' disease in her sister; Heart attack in her paternal grandfather; Heart disease in her father; Hyperlipidemia in her father and mother; Hypertension in her father and mother; Hypothyroidism in her father; Stroke in her father; Thyroid disease in her mother.    ROS:  Please see the history of present illness.   Otherwise, review of systems are positive for numbness and tingling, severe migraines- worse if she goes outside.   All other systems are reviewed and negative.    PHYSICAL EXAM: VS:  BP 130/80   Pulse (!) 104   Ht 5\' 5"  (1.651 m)   Wt 235 lb 6.4 oz (106.8 kg)   SpO2 98%   BMI 39.17 kg/m   , BMI Body mass index is 39.17 kg/m. GEN: Well nourished, well developed, in no acute distress HEENT: normal Neck: no JVD, carotid bruits, or masses Cardiac: RRR; no murmurs, rubs, or gallops,no edema  Respiratory:  clear to auscultation bilaterally, normal work of breathing GI: soft, nontender, nondistended, + BS MS: no deformity or atrophy Skin: warm and dry, no rash Neuro:  Strength and sensation are intact Psych: euthymic mood, full affect   EKG:   The ekg ordered today demonstrates NSR, no ST changes   Recent Labs: 07/05/2019: TSH 3.270 06/28/2020: ALT 42; BUN  14; Creatinine, Ser 0.98; Hemoglobin 12.5; Magnesium 2.1; Platelets 379; Potassium 3.8; Sodium 138   Lipid Panel    Component Value Date/Time   CHOL 165 04/04/2020 1304   TRIG 130 04/04/2020 1304   HDL 59 04/04/2020 1304   CHOLHDL 3 06/17/2011 1026   VLDL 21.0 06/17/2011 1026   LDLCALC 83 04/04/2020 1304   LDLDIRECT 80 04/04/2020 1304     Other studies Reviewed: Additional studies/ records that were reviewed today with results demonstrating: Normal brain MRI in June 2022.   ASSESSMENT AND PLAN:  CAD: Distal LAD by CT scan with moderate disease.  May be some artifact as this was reported on the scan.  Unclear if there truly is atherosclerosis there, but I do not think her clinical scenario warrants further testing with catheterization.  I think the risks would outweigh the benefits at this point.  Has NTG at home.   Given that her calcium score is 0 and that the proximal and mid vessels are without any disease, I think her risk of ACS going forward is low.  We did speak about healthy lifestyle to keep her risk low.  She does not smoke.  Regular exercise to the target noted below will be helpful. TIA: Saw Dr. Leta Baptist.  She plans to follow-up with them.  She has multiple neurologic complaints including tingling in her face, tingling in her hands and "just feel like something is wrong." Hyperlipidemia: Per Dr.  Irven Shelling note, she was switched from fenofibrate to lipitor 10 mg daily.  She actually is taking both currently.  We will check with our Pharm.D.'s regarding fenofibrate and atorvastatin together in addition to her other neurologic medicines. Palpitations: 2 weeks Zio patch. SHOB: Check BNP today.  She has Lasix to use at home as needed.  She needs to increase her level of activity to the target below to try to help improve her stamina.  Prior echocardiogram showed normal LV/RV/valvular function.   Current medicines are reviewed at length with the patient today.  The patient concerns regarding her medicines were addressed.  The following changes have been made:  No change  Labs/ tests ordered today include: Zio patch, lab No orders of the defined types were placed in this encounter.   Recommend 150 minutes/week of aerobic exercise Low fat, low carb, high fiber diet recommended  Disposition:   FU in 1 year   Signed, Larae Grooms, MD  07/01/2020 8:17 AM    [facility] [place], [place], [place]  [address] Phone: [phone]; Fax: [phone]

## 2020-07-01 ENCOUNTER — Ambulatory Visit: Payer: Managed Care, Other (non HMO)

## 2020-07-01 ENCOUNTER — Encounter: Payer: Self-pay | Admitting: Interventional Cardiology

## 2020-07-01 ENCOUNTER — Other Ambulatory Visit: Payer: Self-pay

## 2020-07-01 ENCOUNTER — Ambulatory Visit: Payer: Managed Care, Other (non HMO) | Admitting: Interventional Cardiology

## 2020-07-01 VITALS — BP 130/80 | HR 104 | Ht 65.0 in | Wt 235.4 lb

## 2020-07-01 DIAGNOSIS — I2510 Atherosclerotic heart disease of native coronary artery without angina pectoris: Secondary | ICD-10-CM

## 2020-07-01 DIAGNOSIS — E782 Mixed hyperlipidemia: Secondary | ICD-10-CM | POA: Diagnosis not present

## 2020-07-01 DIAGNOSIS — I2583 Coronary atherosclerosis due to lipid rich plaque: Secondary | ICD-10-CM

## 2020-07-01 DIAGNOSIS — R0602 Shortness of breath: Secondary | ICD-10-CM

## 2020-07-01 DIAGNOSIS — R002 Palpitations: Secondary | ICD-10-CM

## 2020-07-01 LAB — PRO B NATRIURETIC PEPTIDE: NT-Pro BNP: 46 pg/mL (ref 0–249)

## 2020-07-01 NOTE — Progress Notes (Unsigned)
Patient enrolled for [employer] to mail a 14 day ZIO XT to address on file.

## 2020-07-01 NOTE — Patient Instructions (Signed)
Medication Instructions:  Your physician recommends that you continue on your current medications as directed. Please refer to the Current Medication list given to you today.  *If you need a refill on your cardiac medications before your next appointment, please call your pharmacy*   Lab Work: Lab work to be done today--BNP If you have labs (blood work) drawn today and your tests are completely normal, you will receive your results only by: [place] (if you have MyChart) OR A paper copy in the mail If you have any lab test that is abnormal or we need to change your treatment, we will call you to review the results.   Testing/Procedures: Your physician has recommended that you wear a holter monitor. Holter monitors are medical devices that record the heart's electrical activity. Doctors most often use these monitors to diagnose arrhythmias. Arrhythmias are problems with the speed or rhythm of the heartbeat. The monitor is a small, portable device. You can wear one while you do your normal daily activities. This is usually used to diagnose what is causing palpitations/syncope (passing out).   2 week zio   Follow-Up: At [facility], you and your health needs are our priority.  As part of our continuing mission to provide you with exceptional heart care, we have created designated Provider Care Teams.  These Care Teams include your primary Cardiologist (physician) and Advanced Practice Providers (APPs -  Physician Assistants and Nurse Practitioners) who all work together to provide you with the care you need, when you need it.  We recommend signing up for the patient portal called "MyChart".  Sign up information is provided on this After Visit Summary.  MyChart is used to connect with patients for Virtual Visits (Telemedicine).  Patients are able to view lab/test results, encounter notes, upcoming appointments, etc.  Non-urgent messages can be sent to your provider as well.   To learn  more about what you can do with MyChart, go to [email or web address].    Your next appointment:   12 month(s)  The format for your next appointment:   In Person  Provider:   You may see Larae Grooms, MD or one of the following Advanced Practice Providers on your designated Care Team:   Melina Copa, PA-C Ermalinda Barrios, PA-C   Other Instructions  Bryn Gulling- Long Term Monitor Instructions  Your physician has requested you wear a ZIO patch monitor for 14 days.  This is a single patch monitor. [employer] supplies one patch monitor per enrollment. Additional stickers are not available. Please do not apply patch if you will be having a Nuclear Stress Test,  Echocardiogram, Cardiac CT, MRI, or Chest Xray during the period you would be wearing the  monitor. The patch cannot be worn during these tests. You cannot remove and re-apply the  ZIO XT patch monitor.  Your ZIO patch monitor will be mailed 3 day USPS to your address on file. It may take 3-5 days  to receive your monitor after you have been enrolled.  Once you have received your monitor, please review the enclosed instructions. Your monitor  has already been registered assigning a specific monitor serial # to you.  Billing and Patient Assistance Program Information  We have supplied [employer] with any of your insurance information on file for billing purposes. [employer] offers a sliding scale Patient Assistance Program for patients that do not have  insurance, or whose insurance does not completely cover the cost of the ZIO monitor.  You must apply for the  Patient Assistance Program to qualify for this discounted rate.  To apply, please call [employer] at [phone], select option 4, select option 2, ask to apply for  Patient Assistance Program. Theodore Demark will ask your household income, and how many people  are in your household. They will quote your out-of-pocket cost based on that information.  [employer] will also be able to set up a  83-month, interest-free payment plan if needed.  Applying the monitor   Shave hair from upper left chest.  Hold abrader disc by orange tab. Rub abrader in 40 strokes over the upper left chest as  indicated in your monitor instructions.  Clean area with 4 enclosed alcohol pads. Let dry.  Apply patch as indicated in monitor instructions. Patch will be placed under collarbone on left  side of chest with arrow pointing upward.  Rub patch adhesive wings for 2 minutes. Remove white label marked "1". Remove the white  label marked "2". Rub patch adhesive wings for 2 additional minutes.  While looking in a mirror, press and release button in center of patch. A small green light will  flash 3-4 times. This will be your only indicator that the monitor has been turned on.  Do not shower for the first 24 hours. You may shower after the first 24 hours.  Press the button if you feel a symptom. You will hear a small click. Record Date, Time and  Symptom in the Patient Logbook.  When you are ready to remove the patch, follow instructions on the last 2 pages of Patient  Logbook. Stick patch monitor onto the last page of Patient Logbook.  Place Patient Logbook in the blue and white box. Use locking tab on box and tape box closed  securely. The blue and white box has prepaid postage on it. Please place it in the mailbox as  soon as possible. Your physician should have your test results approximately 7 days after the  monitor has been mailed back to [facility].  Call [place] at [phone] if you have questions regarding  your ZIO XT patch monitor. Call them immediately if you see an orange light blinking on your  monitor.  If your monitor falls off in less than 4 days, contact our Monitor department at [phone].  If your monitor becomes loose or falls off after 4 days call [employer] at [phone] for  suggestions on securing your monitor  High-Fiber Eating Plan Fiber,  also called dietary fiber, is a type of carbohydrate. It is found foods such as fruits, vegetables, whole grains, and beans. A high-fiber diet can have many health benefits. Your health care provider may recommend a high-fiber diet to help: Prevent constipation. Fiber can make your bowel movements more regular. Lower your cholesterol. Relieve the following conditions: Inflammation of veins in the anus (hemorrhoids). Inflammation of specific areas of the digestive tract (uncomplicated diverticulosis). A problem of the large intestine, also called the colon, that sometimes causes pain and diarrhea (irritable bowel syndrome, or IBS). Prevent overeating as part of a weight-loss plan. Prevent heart disease, type 2 diabetes, and certain cancers. What are tips for following this plan? Reading food labels  Check the nutrition facts label on food products for the amount of dietary fiber. Choose foods that have 5 grams of fiber or more per serving. The goals for recommended daily fiber intake include: Men (age 106 or younger): 34-38 g. Men (over age 92): 28-34 g. Women (age 47 or younger): 25-28 g. Women (over age  50): 22-25 g. Your daily fiber goal is _____________ g. Shopping Choose whole fruits and vegetables instead of processed forms, such as apple juice or applesauce. Choose a wide variety of high-fiber foods such as avocados, lentils, oats, and kidney beans. Read the nutrition facts label of the foods you choose. Be aware of foods with added fiber. These foods often have high sugar and sodium amounts per serving. Cooking Use whole-grain flour for baking and cooking. Cook with brown rice instead of white rice. Meal planning Start the day with a breakfast that is high in fiber, such as a cereal that contains 5 g of fiber or more per serving. Eat breads and cereals that are made with whole-grain flour instead of refined flour or white flour. Eat brown rice, bulgur wheat, or millet instead of  white rice. Use beans in place of meat in soups, salads, and pasta dishes. Be sure that half of the grains you eat each day are whole grains. General information You can get the recommended daily intake of dietary fiber by: Eating a variety of fruits, vegetables, grains, nuts, and beans. Taking a fiber supplement if you are not able to take in enough fiber in your diet. It is better to get fiber through food than from a supplement. Gradually increase how much fiber you consume. If you increase your intake of dietary fiber too quickly, you may have bloating, cramping, or gas. Drink plenty of water to help you digest fiber. Choose high-fiber snacks, such as berries, raw vegetables, nuts, and popcorn. What foods should I eat? Fruits Berries. Pears. Apples. Oranges. Avocado. Prunes and raisins. Dried figs. Vegetables Sweet potatoes. Spinach. Kale. Artichokes. Cabbage. Broccoli. Cauliflower.Green peas. Carrots. Squash. Grains Whole-grain breads. Multigrain cereal. Oats and oatmeal. Brown rice. Barley.Bulgur wheat. [place]. Quinoa. Bran muffins. Popcorn. Rye wafer crackers. Meats and other proteins Navy beans, kidney beans, and pinto beans. Soybeans. Split peas. Lentils. Nutsand seeds. Dairy Fiber-fortified yogurt. Beverages Fiber-fortified soy milk. Fiber-fortified orange juice. Other foods Fiber bars. The items listed above may not be a complete list of recommended foods and beverages. Contact a dietitian for more information. What foods should I avoid? Fruits Fruit juice. Cooked, strained fruit. Vegetables Fried potatoes. Canned vegetables. Well-cooked vegetables. Grains White bread. Pasta made with refined flour. White rice. Meats and other proteins Fatty cuts of meat. Fried chicken or fried fish. Dairy Milk. Yogurt. Cream cheese. Sour cream. Fats and oils Butters. Beverages Soft drinks. Other foods Cakes and pastries. The items listed above may not be a complete list of foods  and beverages to avoid. Talk with your dietitian about what choices are best for you. Summary Fiber is a type of carbohydrate. It is found in foods such as fruits, vegetables, whole grains, and beans. A high-fiber diet has many benefits. It can help to prevent constipation, lower blood cholesterol, aid weight loss, and reduce your risk of heart disease, diabetes, and certain cancers. Increase your intake of fiber gradually. Increasing fiber too quickly may cause cramping, bloating, and gas. Drink plenty of water while you increase the amount of fiber you consume. The best sources of fiber include whole fruits and vegetables, whole grains, nuts, seeds, and beans. This information is not intended to replace advice given to you by your health care provider. Make sure you discuss any questions you have with your healthcare provider. Document Revised: 04/27/2019 Document Reviewed: 04/27/2019 Elsevier Patient Education  2022 [employer].

## 2020-07-02 ENCOUNTER — Telehealth: Payer: Managed Care, Other (non HMO) | Admitting: Diagnostic Neuroimaging

## 2020-07-03 ENCOUNTER — Other Ambulatory Visit: Payer: Self-pay | Admitting: *Deleted

## 2020-07-03 ENCOUNTER — Other Ambulatory Visit: Payer: Managed Care, Other (non HMO)

## 2020-07-03 NOTE — Telephone Encounter (Signed)
Spoke with pt re mychart message and answered all questions to pt's satisfaction labs were normal ./cy

## 2020-07-04 ENCOUNTER — Telehealth: Payer: Self-pay | Admitting: *Deleted

## 2020-07-04 DIAGNOSIS — E782 Mixed hyperlipidemia: Secondary | ICD-10-CM

## 2020-07-04 NOTE — Telephone Encounter (Signed)
-----   Message from Jettie Booze, MD sent at 07/02/2020  8:34 AM EDT ----- Can stop fenofibrate and recheck liver and lipids in 3-4 months.    Please inform her of the CNS recs and send message to PCP.  It appears they are not in Epic.   Thanks.  JV ----- Message ----- From: Leeroy Bock, RPH-CPP Sent: 07/01/2020  11:56 AM EDT To: Jettie Booze, MD  She takes a decent number of CNS depressants (clonazepam, gabapentin, topiramate, and pramipexole) so there may be additive side effects. She also takes multiple meds that could contribute to serotonin syndrome (buspirone, duloxetine, and rizatriptan). This could present as twitching in her muscles and could potentially explain some of her tingling/shaking, but would recommend she follow up with prescribing MD about this.   Atorvastatin and fenofibrate are not contraindicated to use together, but the combo can increase her risk of myalgias. Her TG were well controlled at 130 and baseline LDL was 80. Since she's started atorvastatin since then, this will help control her TG as well, and she should be able to stop her fenofibrate.  Thanks, Jinny Blossom ----- Message ----- From: Jettie Booze, MD Sent: 07/01/2020   8:40 AM EDT To: Leeroy Bock, RPH-CPP  Megan, can you please review her medicines and see if there are any drug interactions.  It appears she was supposed to stop her fenofibrate and start atorvastatin.  She has continued both.  She is also on several neurologic medicines for headaches.  Thanks.

## 2020-07-04 NOTE — Telephone Encounter (Signed)
I spoke with patient and gave her information from pharmacist and Dr Irish Lack.  Patient will come in for fasting lab work on October 10,2022. Will forward this note to Dr Doristine Bosworth (patient's PCP)

## 2020-07-09 MED ORDER — TORSEMIDE 10 MG PO TABS: ORAL_TABLET | ORAL | 1 refills | Status: DC

## 2020-07-09 NOTE — Telephone Encounter (Signed)
OK to stop Lasix and start Torsemide 10 mg daily prn swelling.   JV

## 2020-07-17 ENCOUNTER — Ambulatory Visit: Payer: Managed Care, Other (non HMO) | Admitting: Diagnostic Neuroimaging

## 2020-07-22 NOTE — Telephone Encounter (Signed)
Im not sure what she wants to do.  I did not want to repeat the same tests done by Dr. Einar Gip because that would be quite costly, unlikely to show anything different, and probably would not be covered by insurance.  Her normal imaging results are reassuring that her heart is functioning well.

## 2020-07-28 ENCOUNTER — Encounter (HOSPITAL_BASED_OUTPATIENT_CLINIC_OR_DEPARTMENT_OTHER): Payer: Self-pay | Admitting: *Deleted

## 2020-07-28 ENCOUNTER — Emergency Department (HOSPITAL_BASED_OUTPATIENT_CLINIC_OR_DEPARTMENT_OTHER)
Admission: EM | Admit: 2020-07-28 | Discharge: 2020-07-28 | Disposition: A | Discharge status: Home / Self Care | Payer: Managed Care, Other (non HMO) | Attending: Emergency Medicine | Admitting: Emergency Medicine

## 2020-07-28 ENCOUNTER — Emergency Department (HOSPITAL_BASED_OUTPATIENT_CLINIC_OR_DEPARTMENT_OTHER): Payer: Managed Care, Other (non HMO)

## 2020-07-28 ENCOUNTER — Other Ambulatory Visit: Payer: Self-pay

## 2020-07-28 DIAGNOSIS — E039 Hypothyroidism, unspecified: Secondary | ICD-10-CM | POA: Diagnosis not present

## 2020-07-28 DIAGNOSIS — M549 Dorsalgia, unspecified: Secondary | ICD-10-CM | POA: Diagnosis not present

## 2020-07-28 DIAGNOSIS — R202 Paresthesia of skin: Secondary | ICD-10-CM | POA: Diagnosis not present

## 2020-07-28 DIAGNOSIS — M79605 Pain in left leg: Secondary | ICD-10-CM | POA: Insufficient documentation

## 2020-07-28 DIAGNOSIS — R600 Localized edema: Secondary | ICD-10-CM | POA: Diagnosis not present

## 2020-07-28 DIAGNOSIS — R252 Cramp and spasm: Secondary | ICD-10-CM | POA: Diagnosis not present

## 2020-07-28 DIAGNOSIS — Z7982 Long term (current) use of aspirin: Secondary | ICD-10-CM | POA: Insufficient documentation

## 2020-07-28 DIAGNOSIS — Z79899 Other long term (current) drug therapy: Secondary | ICD-10-CM | POA: Insufficient documentation

## 2020-07-28 LAB — CBC WITH DIFFERENTIAL/PLATELET
Abs Immature Granulocytes: 0.02 10*3/uL (ref 0.00–0.07)
Basophils Absolute: 0 10*3/uL (ref 0.0–0.1)
Basophils Relative: 1 %
Eosinophils Absolute: 0.1 10*3/uL (ref 0.0–0.5)
Eosinophils Relative: 2 %
HCT: 39 % (ref 36.0–46.0)
Hemoglobin: 12.6 g/dL (ref 12.0–15.0)
Immature Granulocytes: 0 %
Lymphocytes Relative: 31 %
Lymphs Abs: 1.6 10*3/uL (ref 0.7–4.0)
MCH: 29.8 pg (ref 26.0–34.0)
MCHC: 32.3 g/dL (ref 30.0–36.0)
MCV: 92.2 fL (ref 80.0–100.0)
Monocytes Absolute: 0.3 10*3/uL (ref 0.1–1.0)
Monocytes Relative: 7 %
Neutro Abs: 3 10*3/uL (ref 1.7–7.7)
Neutrophils Relative %: 59 %
Platelets: 375 10*3/uL (ref 150–400)
RBC: 4.23 MIL/uL (ref 3.87–5.11)
RDW: 13.1 % (ref 11.5–15.5)
WBC: 5.1 10*3/uL (ref 4.0–10.5)
nRBC: 0 % (ref 0.0–0.2)

## 2020-07-28 LAB — COMPREHENSIVE METABOLIC PANEL
ALT: 24 U/L (ref 0–44)
AST: 17 U/L (ref 15–41)
Albumin: 4.7 g/dL (ref 3.5–5.0)
Alkaline Phosphatase: 66 U/L (ref 38–126)
Anion gap: 9 (ref 5–15)
BUN: 10 mg/dL (ref 6–20)
CO2: 27 mmol/L (ref 22–32)
Calcium: 8.9 mg/dL (ref 8.9–10.3)
Chloride: 104 mmol/L (ref 98–111)
Creatinine, Ser: 0.88 mg/dL (ref 0.44–1.00)
GFR, Estimated: >60 mL/min (ref >60)
Glucose, Bld: 99 mg/dL (ref 70–99)
Potassium: 3.9 mmol/L (ref 3.5–5.1)
Sodium: 140 mmol/L (ref 135–145)
Total Bilirubin: 0.4 mg/dL (ref 0.3–1.2)
Total Protein: 7.3 g/dL (ref 6.5–8.1)

## 2020-07-28 LAB — MAGNESIUM: Magnesium: 2.1 mg/dL (ref 1.7–2.4)

## 2020-07-28 NOTE — Discharge Instructions (Addendum)
Patient is advised to return to the ED if her leg swelling worsens, gets red/pale, or if she develops chest pain/SOB/starts to cough up blood. Patient is also advised to follow up with her neurologist and to call the neurosurgeon regarding her symptoms.

## 2020-07-28 NOTE — ED Provider Notes (Signed)
[place] EMERGENCY DEPT Provider Note   CSN: [id number] Arrival date & time: 07/28/20  1038     History Chief Complaint  Patient presents with   Leg Pain    Left    Stacy [name] is a 45 y.o. female presenting to the ED for left leg swelling and pain that began 2 weeks ago. Patient states that for the past 2 weeks, OTC interventions have not improved her pain or swelling. She was sent here from UC to r/o DVT. Patient denies any recent travel including long car or plane rides. She does not take any form of hormones or birth control and has no personal or family hx of clotting that she knows of. Patient also states that for the past couple of months she has had intermittent left sided numbness/tingling that "no one can figure out". She follows as an outpatient with [facility] neurology. MRI in the ED 1 month ago was negative for any evidence of stroke or MS.   The history is provided by the patient.  Leg Pain Location:  Leg Time since incident:  3 weeks Injury: no   Leg location:  L leg Pain details:    Quality:  Cramping and aching   Radiates to:  Does not radiate   Severity:  Moderate   Onset quality:  Unable to specify   Duration:  2 weeks   Timing:  Intermittent   Progression:  Waxing and waning Chronicity:  New Dislocation: no   Foreign body present:  No foreign bodies Prior injury to area:  No Relieved by:  Nothing Worsened by:  Nothing Associated symptoms: back pain, numbness and tingling   Associated symptoms: no fatigue and no neck pain       Past Medical History:  Diagnosis Date   Depressive disorder    Fibromyalgia    Hypertriglyceridemia    Hypoglycemia    Hypokalemia    Hypothyroidism    Migraine    Nonobstructive atherosclerosis of coronary artery    Numbness and tingling    hands   RLS (restless legs syndrome)    Seizures ([place])    x 1 in school   Thyroid disease    Tremor    hands    Patient Active Problem List    Diagnosis Date Noted   Angina pectoris ([place])    Precordial pain    Chest tightness 03/24/2013   Acute URI 03/24/2013   RESTLESS LEG SYNDROME 08/15/2009   DEPRESSIVE DISORDER 09/26/2008   Unspecified hypothyroidism 08/03/2008    Past Surgical History:  Procedure Laterality Date   ABDOMINAL HYSTERECTOMY  2005   BREAST BIOPSY Left 09/27/2014   [place]     OB History   No obstetric history on file.     Family History  Problem Relation Age of Onset   Diabetes Mother    Hyperlipidemia Mother    Hypertension Mother    Thyroid disease Mother    Bladder Cancer Father    Stroke Father    Hyperlipidemia Father    Heart disease Father    Hypothyroidism Father    Hypertension Father    Colon cancer Maternal Uncle    Graves' disease Sister    Heart attack Paternal Grandfather    Breast cancer Neg Hx     Social History   Tobacco Use   Smoking status: Never   Smokeless tobacco: Never  Vaping Use   Vaping Use: Never used  Substance Use Topics   Alcohol use: Never  Drug use: Never    Home Medications Prior to Admission medications   Medication Sig Start Date End Date Taking? Authorizing Provider  aspirin EC 81 MG tablet Take 1 tablet (81 mg total) by mouth daily. Swallow whole. 04/17/20  Yes Tolia, Sunit, DO  atorvastatin (LIPITOR) 10 MG tablet Take 1 tablet (10 mg total) by mouth daily. 05/16/20 12/12/20 Yes Adrian Prows, MD  busPIRone (BUSPAR) 30 MG tablet Take 30 mg by mouth 2 (two) times daily. 04/19/20  Yes [provider]  clonazePAM (KLONOPIN) 1 MG tablet Take 1 tablet (1 mg total) by mouth at bedtime. 09/25/13  Yes Janith Lima, MD  DULoxetine (CYMBALTA) 60 MG capsule Take 60 mg by mouth at bedtime. 05/15/20  Yes [provider]  gabapentin (NEURONTIN) 300 MG capsule Take 300 mg by mouth 2 (two) times daily. 04/22/20  Yes [provider]  levothyroxine (SYNTHROID) 50 MCG tablet Take 50 mcg by mouth daily before breakfast.   Yes [provider]  pramipexole (MIRAPEX) 1 MG tablet Take 1 mg by mouth at bedtime. 01/04/19  Yes [provider]  topiramate (TOPAMAX) 50 MG tablet Take 1 tablet (50 mg total) by mouth 2 (two) times daily. 06/24/20  Yes Penumalli, Earlean Polka, MD  nitroGLYCERIN (NITROSTAT) 0.4 MG SL tablet Place 1 tablet (0.4 mg total) under the tongue every 5 (five) minutes as needed for chest pain. If you require more than two tablets five minutes apart go to the nearest ER via EMS. 04/01/20 06/28/20  Tolia, Sunit, DO  rizatriptan (MAXALT-MLT) 10 MG disintegrating tablet Take 1 tablet (10 mg total) by mouth as needed for migraine. May repeat in 2 hours if needed 06/24/20   Penumalli, Earlean Polka, MD  torsemide [facility]) 10 MG tablet Take one tablet by mouth daily as needed for swelling 07/09/20   Jettie Booze, MD    Allergies    Patient has no known allergies.  Review of Systems   Review of Systems  Constitutional:  Negative for fatigue.  HENT:  Negative for sore throat and tinnitus.   Respiratory:  Negative for cough and shortness of breath.   Cardiovascular:  Positive for leg swelling. Negative for chest pain.       Unilateral left leg swelling   Gastrointestinal:  Positive for nausea. Negative for blood in stool, diarrhea and vomiting.  Genitourinary:  Negative for dysuria and hematuria.  Musculoskeletal:  Positive for back pain. Negative for neck pain.  Neurological:  Positive for numbness. Negative for dizziness, syncope, light-headedness and headaches.   Physical Exam Updated Vital Signs BP 127/65   Pulse 91   Temp 98.4 F (36.9 C) (Oral)   Resp 16   Ht '5\' 5"'$  (1.651 m)   Wt 103.4 kg   SpO2 98%   BMI 37.94 kg/m   Physical Exam Constitutional:      Appearance: Normal appearance.  HENT:     Head: Normocephalic and atraumatic.  Eyes:     Pupils: Pupils are equal, round, and reactive to light.  Cardiovascular:     Rate and Rhythm: Normal rate and regular rhythm.     Pulses: Normal  pulses.     Heart sounds: Normal heart sounds. No murmur heard. Pulmonary:     Effort: Pulmonary effort is normal. No respiratory distress.     Breath sounds: Normal breath sounds. No wheezing, rhonchi or rales.  Abdominal:     General: There is no distension.     Palpations: Abdomen is soft.  Tenderness: There is no abdominal tenderness.  Musculoskeletal:        General: Swelling present.     Right lower leg: Edema present.     Left lower leg: Edema present.     Comments: Left calf edema and tenderness to palpation Trace bilateral lower extremity edema  Neurological:     General: No focal deficit present.     Mental Status: She is alert and oriented to person, place, and time. Mental status is at baseline.    ED Results / Procedures / Treatments   Labs (all labs ordered are listed, but only abnormal results are displayed) Labs Reviewed  CBC WITH DIFFERENTIAL/PLATELET  COMPREHENSIVE METABOLIC PANEL  MAGNESIUM    EKG None  Radiology US Venous Img Lower Unilateral Left  Result Date: 07/28/2020 CLINICAL DATA:  Left lower extremity edema for the past 2 weeks EXAM: LEFT LOWER EXTREMITY VENOUS DOPPLER ULTRASOUND TECHNIQUE: Gray-scale sonography with compression, as well as color and duplex ultrasound, were performed to evaluate the deep venous system(s) from the level of the common femoral vein through the popliteal and proximal calf veins. COMPARISON:  None. FINDINGS: VENOUS Normal compressibility of the common femoral, superficial femoral, and popliteal veins, as well as the visualized calf veins. Visualized portions of profunda femoral vein and great saphenous vein unremarkable. No filling defects to suggest DVT on grayscale or color Doppler imaging. Doppler waveforms show normal direction of venous flow, normal respiratory plasticity and response to augmentation. Limited views of the contralateral common femoral vein are unremarkable. OTHER None. Limitations: none IMPRESSION:  Negative. Electronically Signed   By: Jacqulynn Cadet M.D.   On: 07/28/2020 12:16    Procedures Procedures   Medications Ordered in ED Medications - No data to display  ED Course  I have reviewed the triage vital signs and the nursing notes.  Pertinent labs & imaging results that were available during my care of the patient were reviewed by me and considered in my medical decision making (see chart for details).    MDM Rules/Calculators/A&P                           Patient is a 45 yo female presenting to the ED from UC for left leg cramping/pain/swelling to r/o DVT. Pain has been worsening over the past 2 weeks per the patient and was waxing and waning, and now is more constant in nature. U/S DVT was negative for DVT.  Had shared decision making conversation with patient regarding her leg pain/cramping. CBC, CMP, and Mg ordered and were all within normal limits. Will give patient phone number for neurosurgeon to call to help with her sxs.   Final Clinical Impression(s) / ED Diagnoses Final diagnoses:  Leg cramping    Rx / DC Orders ED Discharge Orders     None        Dorethea Clan, DO 07/28/20 1348    Tegeler, Gwenyth Allegra, MD 07/29/20 1035

## 2020-07-28 NOTE — ED Triage Notes (Signed)
Pt states she has been having pain in her left leg and swelling for past 2 weeks, OTC interventions have not worked. Sent here from UC to r/o DVT.

## 2020-07-31 ENCOUNTER — Other Ambulatory Visit: Payer: Self-pay | Admitting: Diagnostic Neuroimaging

## 2020-07-31 DIAGNOSIS — R200 Anesthesia of skin: Secondary | ICD-10-CM

## 2020-07-31 NOTE — Progress Notes (Signed)
Orders Placed This Encounter  Procedures   NCV with EMG(electromyography)   Penni Bombard, MD [id number], [id number] PM Certified in Neurology, Neurophysiology and Neuroimaging  [facility] [address] [place], [place] [address] [phone]

## 2020-08-01 ENCOUNTER — Encounter: Payer: Managed Care, Other (non HMO) | Admitting: Diagnostic Neuroimaging

## 2020-08-01 ENCOUNTER — Telehealth: Payer: Self-pay | Admitting: Diagnostic Neuroimaging

## 2020-08-01 ENCOUNTER — Telehealth: Payer: Self-pay

## 2020-08-01 ENCOUNTER — Ambulatory Visit (INDEPENDENT_AMBULATORY_CARE_PROVIDER_SITE_OTHER): Payer: Managed Care, Other (non HMO) | Admitting: Diagnostic Neuroimaging

## 2020-08-01 ENCOUNTER — Other Ambulatory Visit: Payer: Self-pay

## 2020-08-01 DIAGNOSIS — R252 Cramp and spasm: Secondary | ICD-10-CM | POA: Diagnosis not present

## 2020-08-01 DIAGNOSIS — R200 Anesthesia of skin: Secondary | ICD-10-CM | POA: Diagnosis not present

## 2020-08-01 DIAGNOSIS — Z0289 Encounter for other administrative examinations: Secondary | ICD-10-CM

## 2020-08-01 DIAGNOSIS — M4807 Spinal stenosis, lumbosacral region: Secondary | ICD-10-CM

## 2020-08-01 DIAGNOSIS — Z006 Encounter for examination for normal comparison and control in clinical research program: Secondary | ICD-10-CM

## 2020-08-01 DIAGNOSIS — M791 Myalgia, unspecified site: Secondary | ICD-10-CM

## 2020-08-01 NOTE — Procedures (Signed)
[facility]  NCS (NERVE CONDUCTION STUDY) WITH EMG (ELECTROMYOGRAPHY) REPORT   STUDY DATE: 08/01/2020 PATIENT NAME: Stacy [name] DOB: 1975-10-21 MRN: [id number]  ORDERING CLINICIAN: Andrey Spearman, MD   TECHNOLOGIST: Sherre Scarlet ELECTROMYOGRAPHER: Earlean Polka. Joban Colledge, MD  CLINICAL INFORMATION: 45 year old female with lower extremity cramps and numbness.  FINDINGS: NERVE CONDUCTION STUDY:  Bilateral peroneal and tibial motor responses are normal.  Bilateral sural and superficial peroneal sensory responses are normal.  Bilateral tibial F wave latencies are normal.   NEEDLE ELECTROMYOGRAPHY:  Needle examination of bilateral lower extremities is normal.   IMPRESSION:   Normal study.  No electrodiagnostic evidence of large fiber neuropathy or myopathy at this time.   INTERPRETING PHYSICIAN:  Penni Bombard, MD Certified in Neurology, Neurophysiology and Neuroimaging  [facility] [address] [place], [place] [address] [phone]  [facility]    Nerve / Sites Muscle Latency Ref. Amplitude Ref. Rel Amp Segments Distance Velocity Ref. Area    ms ms mV mV %  cm m/s m/s mVms  R Peroneal - EDB     Ankle EDB 4.6 ?6.5 4.0 ?2.0 100 Ankle - EDB 9   14.3     Fib head EDB 10.4  3.3  82.1 Fib head - Ankle 28 49 ?44 12.3     Pop fossa EDB 12.6  3.2  97.7 Pop fossa - Fib head 10 45 ?44 10.6         Pop fossa - Ankle      L Peroneal - EDB     Ankle EDB 3.8 ?6.5 6.0 ?2.0 100 Ankle - EDB 9   17.3     Fib head EDB 9.5  5.2  86.3 Fib head - Ankle 28 50 ?44 15.6     Pop fossa EDB 11.5  5.5  106 Pop fossa - Fib head 10 49 ?44 16.0         Pop fossa - Ankle      R Tibial - AH     Ankle AH 2.6 ?5.8 10.8 ?4.0 100 Ankle - AH 9   18.0     Pop fossa AH 10.0  6.5  60.6 Pop fossa - Ankle 38 52 ?41 14.5  L Tibial - AH     Ankle AH 2.9 ?5.8 12.1 ?4.0 100 Ankle - AH 9   20.4     Pop fossa AH 11.0  3.7  30.9 Pop fossa - Ankle 37 46 ?41  11.5             SNC    Nerve / Sites Rec. Site Peak Lat Ref.  Amp Ref. Segments Distance    ms ms V V  cm  R Sural - Ankle (Calf)     Calf Ankle 3.0 ?4.4 7 ?6 Calf - Ankle 12  L Sural - Ankle (Calf)     Calf Ankle 3.3 ?4.4 9 ?6 Calf - Ankle 14  R Superficial peroneal - Ankle     Lat leg Ankle 3.1 ?4.4 9 ?6 Lat leg - Ankle 14  L Superficial peroneal - Ankle     Lat leg Ankle 2.8 ?4.4 7 ?6 Lat leg - Ankle 12             F  Wave    Nerve F Lat Ref.   ms ms  R Tibial - AH 49.9 ?56.0  L Tibial - AH 48.1 ?56.0  EMG Summary Table    Spontaneous MUAP Recruitment  Muscle IA Fib PSW Fasc Other Amp Dur. Poly Pattern  R. Vastus medialis Normal None None None _______ Normal Normal Normal Normal  R. Tibialis anterior Normal None None None _______ Normal Normal Normal Normal  R. Gastrocnemius (Medial head) Normal None None None _______ Normal Normal Normal Normal  L. Gastrocnemius (Medial head) Normal None None None _______ Normal Normal Normal Normal

## 2020-08-01 NOTE — Progress Notes (Signed)
EMG of BLE is normal. Will check labs and MRI lumbar spine. Patient endorses long history of fibromyalgia and anxiety. May benefit from pain mgmt consult, PT / OT, and psychiatry / psychology evaluations.   Orders Placed This Encounter  Procedures   MR LUMBAR SPINE WO CONTRAST   CK   Aldolase   ANA,IFA RA Diag Pnl w/rflx Tit/Patn   Penni Bombard, MD [id number], [id number] PM Certified in Neurology, Neurophysiology and Neuroimaging  [facility] [address], [place], [place] [address] [phone]

## 2020-08-01 NOTE — Telephone Encounter (Signed)
MRI lumbar spine wo contrast- GI obtains Novella Rob  Sent to GI for scheduling

## 2020-08-01 NOTE — Telephone Encounter (Signed)
Called patient for 90 day Identify phone call no answer, I left a voicemail stating the intent of the phone call and our call back number to be reached in our department. 

## 2020-08-06 NOTE — Telephone Encounter (Signed)
Please see below message from [place] regarding pt's MRI denial from [facility]. It sounds like they want patient to try PT ir pain treatment before having an MRI done.  Good Morning, the attached patient her procedure was denied for You have not completed six weeks of provider directed treatment. 1. The provider directed treatment did not occur within the last three months. 2. Symptoms must be the same or worse after treatment to support imaging. 3. There was no contact withyour provider after completing treatment.

## 2020-08-08 ENCOUNTER — Other Ambulatory Visit: Payer: Self-pay | Admitting: *Deleted

## 2020-08-08 DIAGNOSIS — M79604 Pain in right leg: Secondary | ICD-10-CM

## 2020-08-08 DIAGNOSIS — R200 Anesthesia of skin: Secondary | ICD-10-CM

## 2020-08-08 DIAGNOSIS — M79605 Pain in left leg: Secondary | ICD-10-CM

## 2020-08-08 DIAGNOSIS — R252 Cramp and spasm: Secondary | ICD-10-CM

## 2020-08-08 LAB — ANA,IFA RA DIAG PNL W/RFLX TIT/PATN
ANA Titer 1: NEGATIVE
Cyclic Citrullin Peptide Ab: 9 units (ref 0–19)
Rheumatoid fact SerPl-aCnc: <10 IU/mL (ref <14.0)

## 2020-08-08 LAB — CK: Total CK: 172 U/L (ref 32–182)

## 2020-08-08 LAB — ALDOLASE: Aldolase: 8 U/L (ref 3.3–10.3)

## 2020-08-12 ENCOUNTER — Other Ambulatory Visit: Payer: Managed Care, Other (non HMO)

## 2020-08-13 ENCOUNTER — Other Ambulatory Visit: Payer: Self-pay | Admitting: *Deleted

## 2020-08-13 DIAGNOSIS — R200 Anesthesia of skin: Secondary | ICD-10-CM

## 2020-08-13 DIAGNOSIS — M791 Myalgia, unspecified site: Secondary | ICD-10-CM

## 2020-08-13 DIAGNOSIS — M79605 Pain in left leg: Secondary | ICD-10-CM

## 2020-08-13 DIAGNOSIS — R252 Cramp and spasm: Secondary | ICD-10-CM

## 2020-08-13 DIAGNOSIS — M4807 Spinal stenosis, lumbosacral region: Secondary | ICD-10-CM

## 2020-08-13 DIAGNOSIS — M79604 Pain in right leg: Secondary | ICD-10-CM

## 2020-08-14 ENCOUNTER — Encounter: Payer: Self-pay | Admitting: *Deleted

## 2020-08-14 ENCOUNTER — Telehealth: Payer: Self-pay | Admitting: Interventional Cardiology

## 2020-08-14 DIAGNOSIS — M79604 Pain in right leg: Secondary | ICD-10-CM

## 2020-08-14 NOTE — Telephone Encounter (Signed)
Patient is very concerned---states for the past 3 weeks she has had swelling in both of her legs (mainly in her calves) that doesn't go away. She states she's doing everything she can (uses heating pad, walks daily, elevates legs at night) and she hasn't gotten any relief. She saw her PCP and neurologist--had an ultrasound to rule out DVT. States she also had a nerve study---no findings. Patient hasn't used the restroom as much as she thinks she should and questions whether it is kidney related. She states the leg pain is severe and becoming unbearable-- unable to sleep at night due to discomfort/tossing and turning. States she has taken over-the-counter pain medication with no relief--pain never stops. Over the past 3 weeks she has also become SOB and she occasionally has a feeling of something being stuck in her throat.  States Pt c/o swelling: STAT is pt has developed SOB within 24 hours  How much weight have you gained and in what time span?  No major weight gain She states her weight has been fluctuating  If swelling, where is the swelling located?  Both legs   Are you currently taking a fluid pill?  Yes, patient takes torsemide as needed. She states the past few days she's taken it every day  Are you currently SOB?  Not currently, but patient states she has been SOB lately  Do you have a log of your daily weights (if so, list)?  No log available   Have you gained 3 pounds in a day or 5 pounds in a week?  No   Have you traveled recently?  No, and patient states she elevates her legs, walks daily, and uses heating pad with no relief  Pt c/o Shortness Of Breath: STAT if SOB developed within the last 24 hours or pt is noticeably SOB on the phone  1. Are you currently SOB (can you hear that pt is SOB on the phone)?  No   2. How long have you been experiencing SOB?  Past few weeks  3. Are you SOB when sitting or when up moving around?  Both   4. Are you currently  experiencing any other symptoms?  Leg pain

## 2020-08-14 NOTE — Telephone Encounter (Signed)
I spoke with patient.  She reports constant pain in her calves.  Not worse with exertion. Recent LE venous doppler negative for DVT.  Is seeing PCP for this but no cause of pain has been identified.  Has been taking torsemide daily for last 4 days due to calf swelling.  Also has "swollen pockets" on her feet and toe cramping.  Has a stuck feeling in her throat that comes and goes. Shortness of breath is a feeling in her throat that feels like it is closing up. She has not seen GI and has discussed stuck feeling and shortness of breath with PCP but cause has not been identified. Patient is concerned about ongoing leg pain. I advised her to continue to follow up with PCP and we would call her back if Dr Irish Lack wanted to make any changes or recommended any testing.

## 2020-08-15 NOTE — Telephone Encounter (Signed)
Reviewed with Dr Irish Lack and he recommends patient have LEA Doppler.  Message sent to patient through my chart

## 2020-08-16 ENCOUNTER — Ambulatory Visit (HOSPITAL_COMMUNITY)
Admission: RE | Admit: 2020-08-16 | Discharge: 2020-08-16 | Disposition: A | Discharge status: Home / Self Care | Payer: Managed Care, Other (non HMO) | Source: Ambulatory Visit | Attending: Cardiology | Admitting: Cardiology

## 2020-08-16 ENCOUNTER — Other Ambulatory Visit: Payer: Managed Care, Other (non HMO)

## 2020-08-16 ENCOUNTER — Other Ambulatory Visit: Payer: Self-pay

## 2020-08-16 DIAGNOSIS — M79605 Pain in left leg: Secondary | ICD-10-CM

## 2020-08-16 DIAGNOSIS — M79604 Pain in right leg: Secondary | ICD-10-CM | POA: Insufficient documentation

## 2020-08-19 NOTE — Telephone Encounter (Signed)
I have reviewed the result.  Please see my results reviewed.  If she is still concerned, we can refer to Dr. Gwenlyn Found or Dr. Fletcher Anon, or [facility] if she has seen them before, but I do not think they will have a lot to add.   JV   I spoke with patient and reviewed results with her.  She would like to see Dr Fletcher Anon or Dr Gwenlyn Found. Referral placed.

## 2020-08-19 NOTE — Telephone Encounter (Signed)
Of note, patient had sent message last week stating she was getting MRI lumbar spine on 08/16/20 and would pay OOP. She canceled MRI, had doppler on BLE per cardiology. See Dr Hassell Done note, may refer her to Dr Gwenlyn Found, Dr Fletcher Anon or [facility].

## 2020-08-20 ENCOUNTER — Telehealth: Payer: Self-pay | Admitting: Diagnostic Neuroimaging

## 2020-08-20 NOTE — Telephone Encounter (Signed)
Sent to [facility] spine & pain. Ph # [phone]

## 2020-08-23 NOTE — Telephone Encounter (Signed)
From pt

## 2020-08-24 ENCOUNTER — Emergency Department (HOSPITAL_COMMUNITY)
Admission: EM | Admit: 2020-08-24 | Discharge: 2020-08-24 | Disposition: A | Discharge status: Home / Self Care | Payer: Managed Care, Other (non HMO) | Attending: Emergency Medicine | Admitting: Emergency Medicine

## 2020-08-24 ENCOUNTER — Other Ambulatory Visit: Payer: Self-pay

## 2020-08-24 ENCOUNTER — Telehealth: Payer: Self-pay | Admitting: Home Health

## 2020-08-24 ENCOUNTER — Emergency Department (HOSPITAL_BASED_OUTPATIENT_CLINIC_OR_DEPARTMENT_OTHER): Payer: Managed Care, Other (non HMO)

## 2020-08-24 ENCOUNTER — Encounter (HOSPITAL_COMMUNITY): Payer: Self-pay | Admitting: Emergency Medicine

## 2020-08-24 DIAGNOSIS — M7989 Other specified soft tissue disorders: Secondary | ICD-10-CM | POA: Diagnosis not present

## 2020-08-24 DIAGNOSIS — R600 Localized edema: Secondary | ICD-10-CM | POA: Diagnosis not present

## 2020-08-24 DIAGNOSIS — E039 Hypothyroidism, unspecified: Secondary | ICD-10-CM | POA: Diagnosis not present

## 2020-08-24 DIAGNOSIS — Z7982 Long term (current) use of aspirin: Secondary | ICD-10-CM | POA: Diagnosis not present

## 2020-08-24 DIAGNOSIS — M79669 Pain in unspecified lower leg: Secondary | ICD-10-CM | POA: Diagnosis not present

## 2020-08-24 DIAGNOSIS — G8929 Other chronic pain: Secondary | ICD-10-CM | POA: Diagnosis not present

## 2020-08-24 DIAGNOSIS — Z79899 Other long term (current) drug therapy: Secondary | ICD-10-CM | POA: Insufficient documentation

## 2020-08-24 LAB — CBC WITH DIFFERENTIAL/PLATELET
Abs Immature Granulocytes: 0.02 10*3/uL (ref 0.00–0.07)
Basophils Absolute: 0.1 10*3/uL (ref 0.0–0.1)
Basophils Relative: 1 %
Eosinophils Absolute: 0.1 10*3/uL (ref 0.0–0.5)
Eosinophils Relative: 1 %
HCT: 35.4 % — ABNORMAL LOW (ref 36.0–46.0)
Hemoglobin: 11.7 g/dL — ABNORMAL LOW (ref 12.0–15.0)
Immature Granulocytes: 0 %
Lymphocytes Relative: 25 %
Lymphs Abs: 1.9 10*3/uL (ref 0.7–4.0)
MCH: 31.3 pg (ref 26.0–34.0)
MCHC: 33.1 g/dL (ref 30.0–36.0)
MCV: 94.7 fL (ref 80.0–100.0)
Monocytes Absolute: 0.6 10*3/uL (ref 0.1–1.0)
Monocytes Relative: 8 %
Neutro Abs: 4.8 10*3/uL (ref 1.7–7.7)
Neutrophils Relative %: 65 %
Platelets: 318 10*3/uL (ref 150–400)
RBC: 3.74 MIL/uL — ABNORMAL LOW (ref 3.87–5.11)
RDW: 12.8 % (ref 11.5–15.5)
WBC: 7.4 10*3/uL (ref 4.0–10.5)
nRBC: 0 % (ref 0.0–0.2)

## 2020-08-24 LAB — BASIC METABOLIC PANEL
Anion gap: 9 (ref 5–15)
BUN: 13 mg/dL (ref 6–20)
CO2: 25 mmol/L (ref 22–32)
Calcium: 9 mg/dL (ref 8.9–10.3)
Chloride: 104 mmol/L (ref 98–111)
Creatinine, Ser: 0.89 mg/dL (ref 0.44–1.00)
GFR, Estimated: >60 mL/min (ref >60)
Glucose, Bld: 108 mg/dL — ABNORMAL HIGH (ref 70–99)
Potassium: 3.7 mmol/L (ref 3.5–5.1)
Sodium: 138 mmol/L (ref 135–145)

## 2020-08-24 LAB — MAGNESIUM: Magnesium: 2.1 mg/dL (ref 1.7–2.4)

## 2020-08-24 NOTE — ED Triage Notes (Signed)
Pt c/o bilateral leg pain and swelling x weeks. States she had a doppler study done that showed "no pulse" to her foot. C/o increased pain and discoloration.

## 2020-08-24 NOTE — Progress Notes (Signed)
VASCULAR LAB    Bilateral lower extremity arterial duplex has been performed.  See CV proc for preliminary results.   Gave verbal results to Dr. Edwena Bunde, [facility], RVT 08/24/2020, 5:41 PM

## 2020-08-24 NOTE — ED Provider Notes (Signed)
[facility] EMERGENCY DEPARTMENT Provider Note   CSN: [id number] Arrival date & time: 08/24/20  1508     History Chief Complaint  Patient presents with   Leg Pain    Tylaya Montecalvo is a 45 y.o. female.  Patient is a 45 year old female with a past medical history of chronic lower extremity edema on diuretics, chronic pain, fibromyalgia that is presenting for lower extremity swelling and pain.  Patient states that she has had lower extremity swelling for the last 5 months.  Patient states that she came into the emergency department today because she had 10 out of 10 pain in both legs this morning. The pain is located in bilateral calfs and her feet. She has noticed  discoloration of her toes in which they turn purple intermittently. Currently there are no changes in color to her feet or legs. She has no weakness or numbness. Patient complains of paresthesias in bilateral feet and bilateral hands.  Patient denies any chest pain or shortness of breath.  Patient denies any history of blood thinners. Patient is able to ambulate with some discomfort in her legs. She denies any history of blood clots.   Patient denies any recent fevers, chills, headache, neck stiffness, chest pain, shortness of breath, nausea, vomiting, diarrhea, abdominal pain, weakness or numbness.   Leg Pain Associated symptoms: no fatigue, no fever and no neck pain       Past Medical History:  Diagnosis Date   Depressive disorder    Fibromyalgia    Hypertriglyceridemia    Hypoglycemia    Hypokalemia    Hypothyroidism    Migraine    Nonobstructive atherosclerosis of coronary artery    Numbness and tingling    hands   RLS (restless legs syndrome)    Seizures ([place])    x 1 in school   Thyroid disease    Tremor    hands    Patient Active Problem List   Diagnosis Date Noted   Angina pectoris ([place])    Precordial pain    Chest tightness 03/24/2013   Acute URI 03/24/2013   RESTLESS LEG  SYNDROME 08/15/2009   DEPRESSIVE DISORDER 09/26/2008   Unspecified hypothyroidism 08/03/2008    Past Surgical History:  Procedure Laterality Date   ABDOMINAL HYSTERECTOMY  2005   BREAST BIOPSY Left 09/27/2014   [place]     OB History   No obstetric history on file.     Family History  Problem Relation Age of Onset   Diabetes Mother    Hyperlipidemia Mother    Hypertension Mother    Thyroid disease Mother    Bladder Cancer Father    Stroke Father    Hyperlipidemia Father    Heart disease Father    Hypothyroidism Father    Hypertension Father    Colon cancer Maternal Uncle    Graves' disease Sister    Heart attack Paternal Grandfather    Breast cancer Neg Hx     Social History   Tobacco Use   Smoking status: Never   Smokeless tobacco: Never  Vaping Use   Vaping Use: Never used  Substance Use Topics   Alcohol use: Never   Drug use: Never    Home Medications Prior to Admission medications   Medication Sig Start Date End Date Taking? Authorizing Provider  aspirin EC 81 MG tablet Take 1 tablet (81 mg total) by mouth daily. Swallow whole. 04/17/20  Yes Tolia, Sunit, DO  atorvastatin (LIPITOR) 10 MG tablet Take  1 tablet (10 mg total) by mouth daily. 05/16/20 12/12/20 Yes Adrian Prows, MD  busPIRone (BUSPAR) 30 MG tablet Take 30 mg by mouth 2 (two) times daily. 04/19/20  Yes [provider]  clonazePAM (KLONOPIN) 1 MG tablet Take 1 tablet (1 mg total) by mouth at bedtime. Patient taking differently: Take 0.5 mg by mouth in the morning and at bedtime. 09/25/13  Yes Janith Lima, MD  DULoxetine (CYMBALTA) 60 MG capsule Take 60 mg by mouth at bedtime. 05/15/20  Yes [provider]  gabapentin (NEURONTIN) 300 MG capsule Take 300 mg by mouth 2 (two) times daily. 04/22/20  Yes [provider]  levothyroxine (SYNTHROID) 50 MCG tablet Take 50 mcg by mouth daily before breakfast.   Yes [provider]  nitroGLYCERIN (NITROSTAT) 0.4 MG SL tablet Place  1 tablet (0.4 mg total) under the tongue every 5 (five) minutes as needed for chest pain. If you require more than two tablets five minutes apart go to the nearest ER via EMS. 04/01/20 08/24/20 Yes Tolia, Sunit, DO  pramipexole (MIRAPEX) 1 MG tablet Take 1 mg by mouth at bedtime. 01/04/19  Yes [provider]  rizatriptan (MAXALT-MLT) 10 MG disintegrating tablet Take 1 tablet (10 mg total) by mouth as needed for migraine. May repeat in 2 hours if needed 06/24/20  Yes Penumalli, Earlean Polka, MD  torsemide (DEMADEX) 10 MG tablet Take one tablet by mouth daily as needed for swelling 07/09/20  Yes Jettie Booze, MD  topiramate (TOPAMAX) 50 MG tablet Take 1 tablet (50 mg total) by mouth 2 (two) times daily. 06/24/20   Penumalli, Earlean Polka, MD    Allergies    Patient has no known allergies.  Review of Systems   Review of Systems  Constitutional:  Negative for chills, diaphoresis, fatigue and fever.  HENT:  Negative for congestion, dental problem, ear discharge, ear pain, facial swelling, hearing loss, nosebleeds, postnasal drip, rhinorrhea, sinus pain, sneezing, sore throat and trouble swallowing.   Eyes:  Negative for pain and visual disturbance.  Respiratory:  Negative for cough, chest tightness, shortness of breath, wheezing and stridor.   Cardiovascular:  Negative for chest pain, palpitations and leg swelling.  Gastrointestinal:  Negative for abdominal distention, abdominal pain, blood in stool, constipation, diarrhea, nausea and vomiting.  Endocrine: Negative for polydipsia and polyuria.  Genitourinary:  Negative for difficulty urinating, dysuria, flank pain, frequency, hematuria, urgency, vaginal bleeding and vaginal discharge.  Musculoskeletal:  Negative for myalgias, neck pain and neck stiffness.       Bilateral lower extremity swelling and pain  Skin:  Negative for rash and wound.  Allergic/Immunologic: Negative for environmental allergies and food allergies.  Neurological:  Negative  for dizziness, seizures, syncope, facial asymmetry, speech difficulty, weakness, light-headedness, numbness and headaches.  Psychiatric/Behavioral:  Negative for agitation, behavioral problems and confusion.    Physical Exam Updated Vital Signs BP (!) 150/86 (BP Location: Right Arm)   Pulse 92   Temp 97.8 F (36.6 C) (Oral)   Resp 16   SpO2 100%   Physical Exam Vitals and nursing note reviewed.  Constitutional:      General: She is not in acute distress.    Appearance: Normal appearance. She is normal weight. She is not ill-appearing.  HENT:     Head: Normocephalic and atraumatic.     Right Ear: External ear normal.     Left Ear: External ear normal.     Nose: Nose normal. No congestion.     Mouth/Throat:  Mouth: Mucous membranes are moist.     Pharynx: Oropharynx is clear. No oropharyngeal exudate or posterior oropharyngeal erythema.  Eyes:     General: No visual field deficit.    Extraocular Movements: Extraocular movements intact.     Conjunctiva/sclera: Conjunctivae normal.     Pupils: Pupils are equal, round, and reactive to light.  Neck:     Vascular: No carotid bruit.  Cardiovascular:     Rate and Rhythm: Normal rate and regular rhythm.     Pulses: Normal pulses.     Heart sounds: Normal heart sounds. No murmur heard. Pulmonary:     Effort: Pulmonary effort is normal. No respiratory distress.     Breath sounds: Normal breath sounds. No stridor. No wheezing, rhonchi or rales.  Chest:     Chest wall: No tenderness.  Abdominal:     General: Bowel sounds are normal. There is no distension.     Palpations: Abdomen is soft.     Tenderness: There is no abdominal tenderness. There is no right CVA tenderness, left CVA tenderness, guarding or rebound.  Musculoskeletal:        General: No swelling or tenderness. Normal range of motion.     Cervical back: Normal range of motion and neck supple. No rigidity, tenderness or bony tenderness.     Thoracic back: Normal. No  tenderness or bony tenderness.     Lumbar back: Normal. No tenderness or bony tenderness.     Right lower leg: Normal. No tenderness. No edema.     Left lower leg: Normal. No tenderness. No edema.     Right ankle: Normal. Normal pulse.     Right Achilles Tendon: Normal.     Left ankle: Normal. Normal pulse.     Left Achilles Tendon: Normal.     Right foot: Normal. No tenderness. Normal pulse.     Left foot: Normal. No tenderness. Normal pulse.  Skin:    General: Skin is warm and dry.     Coloration: Skin is not jaundiced.  Neurological:     General: No focal deficit present.     Mental Status: She is alert and oriented to person, place, and time. Mental status is at baseline.     Cranial Nerves: Cranial nerves are intact. No cranial nerve deficit, dysarthria or facial asymmetry.     Sensory: Sensation is intact. No sensory deficit.     Motor: Motor function is intact. No weakness.     Coordination: Coordination is intact. Finger-Nose-Finger Test normal.     Gait: Gait is intact. Gait normal.  Psychiatric:        Mood and Affect: Mood normal.        Behavior: Behavior normal.        Thought Content: Thought content normal.        Judgment: Judgment normal.    ED Results / Procedures / Treatments   Labs (all labs ordered are listed, but only abnormal results are displayed) Labs Reviewed  CBC WITH DIFFERENTIAL/PLATELET - Abnormal; Notable for the following components:      Result Value   RBC 3.74 (*)    Hemoglobin 11.7 (*)    HCT 35.4 (*)    All other components within normal limits  BASIC METABOLIC PANEL - Abnormal; Notable for the following components:   Glucose, Bld 108 (*)    All other components within normal limits  MAGNESIUM    EKG None  Radiology VAS [place] LOWER EXTREMITY ARTERIAL DUPLEX (ONLY [facility] &  [facility] 7a-7p)  Result Date: 08/25/2020 LOWER EXTREMITY ARTERIAL DUPLEX STUDY Patient Name:  JAVIER SARKISYAN  Date of Exam:   08/24/2020 Medical Rec #: [id number]                Accession #:    [id number] Date of Birth: 05/20/1975                Patient Gender: F Patient Age:   16 years Exam Location:  [facility] Procedure:      VAS [place] LOWER EXTREMITY ARTERIAL DUPLEX Referring Phys: Nicki Reaper [place] --------------------------------------------------------------------------------  Indications: Numbness and tingling in feet for thee past month with intermittent              purple discoloration of toes. Calf aching for >3 weeks. High Risk Factors: Hyperlipidemia. Other Factors: Normal nerve conduction study done 08/01/20.  Current ABI: n/a Comparison Study: Prior ABI and LEA done 08/16/20 Performing Technologist: Sharion Dove RVS  Examination Guidelines: A complete evaluation includes B-mode imaging, spectral Doppler, color Doppler, and power Doppler as needed of all accessible portions of each vessel. Bilateral testing is considered an integral part of a complete examination. Limited examinations for reoccurring indications may be performed as noted.  +-----------+--------+-----+--------+-----------+--------+ RIGHT      PSV cm/sRatioStenosisWaveform   Comments +-----------+--------+-----+--------+-----------+--------+ CFA Prox   213                  multiphasic         +-----------+--------+-----+--------+-----------+--------+ DFA        74                   multiphasic         +-----------+--------+-----+--------+-----------+--------+ SFA Prox   125                  multiphasic         +-----------+--------+-----+--------+-----------+--------+ SFA Mid    118                  multiphasic         +-----------+--------+-----+--------+-----------+--------+ SFA Distal 111                  multiphasic         +-----------+--------+-----+--------+-----------+--------+ POP Prox   51                   multiphasic         +-----------+--------+-----+--------+-----------+--------+ POP Distal 54                   multiphasic          +-----------+--------+-----+--------+-----------+--------+ ATA Prox   52                   multiphasic         +-----------+--------+-----+--------+-----------+--------+ ATA Mid    50                   multiphasic         +-----------+--------+-----+--------+-----------+--------+ ATA Distal 53                   multiphasic         +-----------+--------+-----+--------+-----------+--------+ PTA Prox   51                   multiphasic         +-----------+--------+-----+--------+-----------+--------+ PTA Mid    32  multiphasic         +-----------+--------+-----+--------+-----------+--------+ PTA Distal 40                   multiphasic         +-----------+--------+-----+--------+-----------+--------+ PERO Prox  52                   multiphasic         +-----------+--------+-----+--------+-----------+--------+ PERO Mid   44                   multiphasic         +-----------+--------+-----+--------+-----------+--------+ PERO Distal61                   multiphasic         +-----------+--------+-----+--------+-----------+--------+ DP         49                                       +-----------+--------+-----+--------+-----------+--------+  +-----------+--------+-----+--------+-----------+--------+ LEFT       PSV cm/sRatioStenosisWaveform   Comments +-----------+--------+-----+--------+-----------+--------+ CFA Prox   204                  multiphasic         +-----------+--------+-----+--------+-----------+--------+ DFA        89                   multiphasic         +-----------+--------+-----+--------+-----------+--------+ SFA Prox   150                  multiphasic         +-----------+--------+-----+--------+-----------+--------+ SFA Mid    142                  multiphasic         +-----------+--------+-----+--------+-----------+--------+ SFA Distal 97                   multiphasic          +-----------+--------+-----+--------+-----------+--------+ POP Prox   42                   multiphasic         +-----------+--------+-----+--------+-----------+--------+ POP Distal 58                   multiphasic         +-----------+--------+-----+--------+-----------+--------+ ATA Prox   53                   multiphasic         +-----------+--------+-----+--------+-----------+--------+ ATA Mid    37                   multiphasic         +-----------+--------+-----+--------+-----------+--------+ ATA Distal 48                   multiphasic         +-----------+--------+-----+--------+-----------+--------+ PTA Prox   44                   multiphasic         +-----------+--------+-----+--------+-----------+--------+ PTA Mid    41                   multiphasic         +-----------+--------+-----+--------+-----------+--------+ PTA Distal 51  multiphasic         +-----------+--------+-----+--------+-----------+--------+ PERO Prox  46                   multiphasic         +-----------+--------+-----+--------+-----------+--------+ PERO Mid   56                   multiphasic         +-----------+--------+-----+--------+-----------+--------+ PERO Distal38                   multiphasic         +-----------+--------+-----+--------+-----------+--------+ DP         59                                       +-----------+--------+-----+--------+-----------+--------+  Summary: Right: No evidence of atherosclerosis or stenosis seen. No change since study done 08/16/20. Left: No evidence of atherosclerosis or stenosis seen. No change since study done 08/16/20.   See table(s) above for measurements and observations. Electronically signed by Deitra Mayo MD on 08/25/2020 at 7:23:41 AM.    Final    VAS [place] LOWER EXTREMITY VENOUS (DVT) (ONLY [facility] & [facility])  Result Date: 08/25/2020  Lower Venous DVT Study Patient Name:  SYLINA LISLE  Date of Exam:   08/24/2020 Medical Rec #: [id number]               Accession #:    [id number] Date of Birth: 1975-05-18                Patient Gender: F Patient Age:   5 years Exam Location:  [facility] Procedure:      VAS [place] LOWER EXTREMITY VENOUS (DVT) Referring Phys: Nicki Reaper [place] --------------------------------------------------------------------------------  Indications: Pain, and calf and foot swelling for >5 months.  Comparison       Prior negative left lower extremity venous ultrasound done Study:           07/28/20 Performing Technologist: Sharion Dove RVS  Examination Guidelines: A complete evaluation includes B-mode imaging, spectral Doppler, color Doppler, and power Doppler as needed of all accessible portions of each vessel. Bilateral testing is considered an integral part of a complete examination. Limited examinations for reoccurring indications may be performed as noted. The reflux portion of the exam is performed with the patient in reverse Trendelenburg.  +---------+---------------+---------+-----------+----------+--------------+ RIGHT    CompressibilityPhasicitySpontaneityPropertiesThrombus Aging +---------+---------------+---------+-----------+----------+--------------+ CFV      Full           Yes      Yes                                 +---------+---------------+---------+-----------+----------+--------------+ SFJ      Full                                                        +---------+---------------+---------+-----------+----------+--------------+ FV Prox  Full                                                        +---------+---------------+---------+-----------+----------+--------------+  FV Mid   Full                                                        +---------+---------------+---------+-----------+----------+--------------+ FV DistalFull                                                         +---------+---------------+---------+-----------+----------+--------------+ PFV      Full                                                        +---------+---------------+---------+-----------+----------+--------------+ POP      Full           Yes      Yes                                 +---------+---------------+---------+-----------+----------+--------------+ PTV      Full                                                        +---------+---------------+---------+-----------+----------+--------------+ PERO     Full                                                        +---------+---------------+---------+-----------+----------+--------------+   +---------+---------------+---------+-----------+----------+--------------+ LEFT     CompressibilityPhasicitySpontaneityPropertiesThrombus Aging +---------+---------------+---------+-----------+----------+--------------+ CFV      Full           Yes      Yes                                 +---------+---------------+---------+-----------+----------+--------------+ SFJ      Full                                                        +---------+---------------+---------+-----------+----------+--------------+ FV Prox  Full                                                        +---------+---------------+---------+-----------+----------+--------------+ FV Mid   Full                                                        +---------+---------------+---------+-----------+----------+--------------+  FV DistalFull                                                        +---------+---------------+---------+-----------+----------+--------------+ PFV      Full                                                        +---------+---------------+---------+-----------+----------+--------------+ POP      Full           Yes      Yes                                  +---------+---------------+---------+-----------+----------+--------------+ PTV      Full                                                        +---------+---------------+---------+-----------+----------+--------------+ PERO     Full                                                        +---------+---------------+---------+-----------+----------+--------------+     Summary: BILATERAL: - No evidence of deep vein thrombosis seen in the lower extremities, bilaterally. -No evidence of popliteal cyst, bilaterally.   *See table(s) above for measurements and observations. Electronically signed by Deitra Mayo MD on 08/25/2020 at 7:23:55 AM.    Final     Procedures Procedures   Medications Ordered in ED Medications - No data to display  ED Course  I have reviewed the triage vital signs and the nursing notes.  Pertinent labs & imaging results that were available during my care of the patient were reviewed by me and considered in my medical decision making (see chart for details).    MDM Rules/Calculators/A&P                         Tayzlee Speziale is a 45 y.o. female with a past medical history of chronic lower extremity edema on diuretics, chronic pain, fibromyalgia that is presenting for lower extremity swelling and pain in bilateral legs.  Patient is hemodynamically stable and in no acute distress.  Patient has been complaining of lower extremity swelling for the last 5 months.  She states that the pain increased today in her calfs and feet.  She denies any numbness or weakness.  Patient has no history of blood clots.  She is not taking blood thinners.  She denies any chest pain or shortness of breath. She has no back pain. Patient was recently evaluated in the emergency department for similar symptoms.  She had a DVT study that was negative on 7/26.  She has been followed by her PCP for the symptoms.  Patient was evaluated by neurology for the symptoms and had an EMG  of  bilateral lower extremity that were normal.  Neurology recommended pain management consult, PT/OT and psychiatric/psychology evaluation.  On my physical exam, patient had strong posterior tibial and dorsalis pedis artery pulses.  Patient is neurovascularly intact.  Patient is able to ambulate without complications.  Patient has no discoloration to her lower extremities. She does complain of a cramping sensation in her legs so we will obtain lab work. He labs were unremarkable.   [place] lower extremity arterial duplex showed no atherosclerosis or stenosis.   [place] lower extremity venous showed no DVT.   Patient is able to ambulate without complications. Patient is going to follow up with her PCP.   Patient states compliance and understanding of the plan. I explained labs and imaging to the patient. No further questions at this time from the patient.  The patient is safe and stable for discharge at this time with return precautions provided and a plan for follow-up care in place as needed  The plan for this patient was discussed with Dr. Regenia Skeeter, who voiced agreement and who oversaw evaluation and treatment of this patient.   Final Clinical Impression(s) / ED Diagnoses Final diagnoses:  Bilateral lower extremity edema    Rx / DC Orders ED Discharge Orders     None        Doretha Sou, MD 08/25/20 [place], MD 08/29/20 [phone]

## 2020-08-24 NOTE — ED Notes (Signed)
States that she is upset with the care that we gave today. "I am walking out of here in pain, the ultrasound tech was rude, and was told by the emergency department doctors that she is medically stable." I asked if there was anything I could do and she said, "No I'm ready to leave now!"

## 2020-08-24 NOTE — Telephone Encounter (Signed)
Patient paged after-hours service, reporting bilateral toes purple discoloration.  She reports seeing Dr. Irish Lack, had a Doppler study completed this past Tuesday, reports that " there was no pulse".  She endorses ongoing purple discoloration of toes, felt her ankle looked different in color, reports intermittent numbness, tingling, pain of her toes.  Advised patient had to the ER for in person evaluation given her description is concerning for limb ischemia and potentially needing vascular surgical consultation.  She is agreeable, has family member who is available to drive her to the ER.

## 2020-08-24 NOTE — Discharge Instructions (Addendum)
Please follow-up with your PCP.  Continue to take your medications as prescribed.

## 2020-08-24 NOTE — Progress Notes (Signed)
VASCULAR LAB    Bilateral lower extremity venous duplex has been performed.  See CV proc for preliminary results.  Gave verbal results to Dr. Edwena Bunde, [facility], RVT 08/24/2020, 5:42 PM

## 2020-08-27 ENCOUNTER — Other Ambulatory Visit: Payer: Self-pay

## 2020-08-27 ENCOUNTER — Ambulatory Visit: Payer: Managed Care, Other (non HMO) | Admitting: Cardiovascular Disease

## 2020-08-27 VITALS — BP 138/80 | HR 96 | Ht 65.5 in | Wt 231.0 lb

## 2020-08-27 DIAGNOSIS — M79604 Pain in right leg: Secondary | ICD-10-CM | POA: Diagnosis not present

## 2020-08-27 DIAGNOSIS — E785 Hyperlipidemia, unspecified: Secondary | ICD-10-CM | POA: Diagnosis not present

## 2020-08-27 DIAGNOSIS — M79605 Pain in left leg: Secondary | ICD-10-CM | POA: Diagnosis not present

## 2020-08-27 DIAGNOSIS — I2510 Atherosclerotic heart disease of native coronary artery without angina pectoris: Secondary | ICD-10-CM | POA: Diagnosis not present

## 2020-08-27 NOTE — Telephone Encounter (Signed)
error 

## 2020-08-27 NOTE — Patient Instructions (Signed)
Medication Instructions:  STOP atorvastatin   *If you need a refill on your cardiac medications before your next appointment, please call your pharmacy*  Follow-Up: At [facility], you and your health needs are our priority.  As part of our continuing mission to provide you with exceptional heart care, we have created designated Provider Care Teams.  These Care Teams include your primary Cardiologist (physician) and Advanced Practice Providers (APPs -  Physician Assistants and Nurse Practitioners) who all work together to provide you with the care you need, when you need it.  We recommend signing up for the patient portal called "MyChart".  Sign up information is provided on this After Visit Summary.  MyChart is used to connect with patients for Virtual Visits (Telemedicine).  Patients are able to view lab/test results, encounter notes, upcoming appointments, etc.  Non-urgent messages can be sent to your provider as well.   To learn more about what you can do with MyChart, go to [email or web address].    Your next appointment:   AS NEEDED with Dr. Fletcher Anon

## 2020-08-27 NOTE — Progress Notes (Signed)
Cardiology Office Note   Date:  08/27/2020   ID:  Stacy [name], DOB Jun 28, 1975, MRN [id number]  PCP:  Mckinley Jewel, MD  Cardiologist:  Dr. Irish Lack  No chief complaint on file.     History of Present Illness: Stacy [name] is a 45 y.o. female who was referred by Dr. Irish Lack for evaluation management of possible peripheral arterial disease. She has known history of fibromyalgia, migraines, nonobstructive coronary artery disease on previous cardiac CTA in April 2022, hypothyroidism and hyperlipidemia.  She has been struggling with bilateral leg pain that initially affected the left leg and the right leg.  The pain is severe and described as aching all the time whether she is resting or with walking.  It is especially prevalent at night when she is trying to sleep.  She feels a throbbing sensation in both legs she got no relief with pain medications or heating pads.  She also noted increased swelling in both legs as well as intermittent bluish discoloration in her toes.  She seems to be very frustrated and tearful.  She underwent recent lower extremity arterial Dopplers which showed normal ABI bilaterally with mildly abnormal right TBI at 0.6.waveforms at the ankle were mildly abnormal.  Duplex showed no significant lower extremity arterial disease.  Venous Doppler showed no evidence of DVT in both lower extremities.  Past Medical History:  Diagnosis Date   Depressive disorder    Fibromyalgia    Hypertriglyceridemia    Hypoglycemia    Hypokalemia    Hypothyroidism    Migraine    Nonobstructive atherosclerosis of coronary artery    Numbness and tingling    hands   RLS (restless legs syndrome)    Seizures (HCC)    x 1 in school   Thyroid disease    Tremor    hands    Past Surgical History:  Procedure Laterality Date   ABDOMINAL HYSTERECTOMY  2005   BREAST BIOPSY Left 09/27/2014   PASH     Current Outpatient Medications  Medication Sig Dispense  Refill   aspirin EC 81 MG tablet Take 1 tablet (81 mg total) by mouth daily. Swallow whole. 90 tablet 3   busPIRone (BUSPAR) 30 MG tablet Take 30 mg by mouth 2 (two) times daily.     clonazePAM (KLONOPIN) 1 MG tablet Take 1 tablet (1 mg total) by mouth at bedtime. (Patient taking differently: Take 0.5 mg by mouth in the morning and at bedtime.) 30 tablet 0   DULoxetine (CYMBALTA) 60 MG capsule Take 60 mg by mouth at bedtime.     gabapentin (NEURONTIN) 300 MG capsule Take 300 mg by mouth 2 (two) times daily.     levothyroxine (SYNTHROID) 50 MCG tablet Take 50 mcg by mouth daily before breakfast.     pramipexole (MIRAPEX) 1 MG tablet Take 1 mg by mouth at bedtime.     rizatriptan (MAXALT-MLT) 10 MG disintegrating tablet Take 1 tablet (10 mg total) by mouth as needed for migraine. May repeat in 2 hours if needed 9 tablet 11   torsemide (DEMADEX) 10 MG tablet Take one tablet by mouth daily as needed for swelling 90 tablet 1   nitroGLYCERIN (NITROSTAT) 0.4 MG SL tablet Place 1 tablet (0.4 mg total) under the tongue every 5 (five) minutes as needed for chest pain. If you require more than two tablets five minutes apart go to the nearest ER via EMS. 30 tablet 0   topiramate (TOPAMAX) 50 MG tablet Take 1  tablet (50 mg total) by mouth 2 (two) times daily. 60 tablet 12   No current facility-administered medications for this visit.    Allergies:   Patient has no known allergies.    Social History:  The patient  reports that she has never smoked. She has never used smokeless tobacco. She reports that she does not drink alcohol and does not use drugs.   Family History:  The patient's family history includes Bladder Cancer in her father; Colon cancer in her maternal uncle; Diabetes in her mother; Berenice Primas' disease in her sister; Heart attack in her paternal grandfather; Heart disease in her father; Hyperlipidemia in her father and mother; Hypertension in her father and mother; Hypothyroidism in her father;  Stroke in her father; Thyroid disease in her mother.    ROS:  Please see the history of present illness.   Otherwise, review of systems are positive for none.   All other systems are reviewed and negative.    PHYSICAL EXAM: VS:  BP 138/80 (BP Location: Left Arm, Patient Position: Sitting, Cuff Size: Large)   Pulse 96   Ht 5' 5.5" (1.664 m)   Wt 231 lb (104.8 kg)   SpO2 96%   BMI 37.86 kg/m  , BMI Body mass index is 37.86 kg/m. GEN: Well nourished, well developed, in no acute distress  HEENT: normal  Neck: no JVD, carotid bruits, or masses Cardiac: RRR; no murmurs, rubs, or gallops,no edema  Respiratory:  clear to auscultation bilaterally, normal work of breathing GI: soft, nontender, nondistended, + BS MS: no deformity or atrophy  Skin: warm and dry, no rash Neuro:  Strength and sensation are intact Psych: euthymic mood, full affect Vascular: Femoral pulses normal bilaterally.  Dorsalis pedis and posterior tibial is palpable on both sides.  Her toes are cold to touch with mild bluish discoloration.   EKG:  EKG is not ordered today.   Recent Labs: 07/01/2020: NT-Pro BNP 46 07/28/2020: ALT 24 08/24/2020: BUN 13; Creatinine, Ser 0.89; Hemoglobin 11.7; Magnesium 2.1; Platelets 318; Potassium 3.7; Sodium 138    Lipid Panel    Component Value Date/Time   CHOL 165 04/04/2020 1304   TRIG 130 04/04/2020 1304   HDL 59 04/04/2020 1304   CHOLHDL 3 06/17/2011 1026   VLDL 21.0 06/17/2011 1026   LDLCALC 83 04/04/2020 1304   LDLDIRECT 80 04/04/2020 1304      Wt Readings from Last 3 Encounters:  08/27/20 231 lb (104.8 kg)  07/28/20 228 lb (103.4 kg)  07/01/20 235 lb 6.4 oz (106.8 kg)       No flowsheet data found.    ASSESSMENT AND PLAN:  1.  Bilateral leg pain: The exact etiology is not entirely clear.  It does not seem to be vascular in etiology given that she had no significant obstructive disease affecting the main arteries.  Her ABI was normal and there was no  significant disease affecting the main arteries with duplex.  She does have evidence of what seems to be microvascular disease affecting the foot as evidenced by some mild bluish discoloration of the toes and abnormal toe pressure with noninvasive testing.  Her microvascular disease does not explain the pain in her calves and thighs.  She reports no improvement in symptoms with gabapentin.  I explained to her that her symptoms cannot be explained based on vascular findings. She was started on atorvastatin few months ago and I wonder if some of her muscle pain is related to atorvastatin.  Thus, I think  it is worth holding atorvastatin for a few weeks to evaluate her symptoms.  2.  Coronary artery disease involving native coronary arteries without angina: Her disease was not obstructive.  3.  Hyperlipidemia: Holding atorvastatin to see if her muscle symptoms improve.  If no improvement in symptoms, atorvastatin can be resumed but if there is improvement then alternatives might need to be considered.  Disposition:   FU with me as needed  Signed,  Kathlyn Sacramento, MD  08/27/2020 1:46 PM    [facility]

## 2020-09-10 ENCOUNTER — Ambulatory Visit: Payer: Managed Care, Other (non HMO) | Admitting: Cardiovascular Disease

## 2020-09-16 MED ORDER — ROSUVASTATIN CALCIUM 10 MG PO TABS: ORAL_TABLET | ORAL | 3 refills | Status: DC

## 2020-09-16 NOTE — Telephone Encounter (Signed)
Moderate stenosis (50-69%) in distal LAD due to an eccentric noncalcified plaque. Overall accuracy is limited due to artifact involving apical LAD and LCx distribution.  4. Study is sent for CT-FFR to further evaluate the distal LAD. CT FFR analysis showed no significant stenosis.   Calcium score was 0, and the "blockage" may be artifact as noted above.     I think we could continue to watch her off of statin.  If she does feel more comfortable being on something for cholesterol, could try rosuvastatin 10 mg once a week.  THis is usually better tolerated and gives effective cholesterol lowering.  Can refer to lipid clinic   [place]

## 2020-09-20 ENCOUNTER — Telehealth: Payer: Self-pay | Admitting: Interventional Cardiology

## 2020-09-20 NOTE — Telephone Encounter (Signed)
Left message for patient to call back  

## 2020-09-20 NOTE — Telephone Encounter (Signed)
Pt c/o of Chest Pain: STAT if CP now or developed within 24 hours  1. Are you having CP right now? no  2. Are you experiencing any other symptoms (ex. SOB, nausea, vomiting, sweating)? Off and on pain in neck, nausea   3. How long have you been experiencing CP? 2 days   4. Is your CP continuous or coming and going? Comes and goes  5. Have you taken Nitroglycerin? yes ?

## 2020-09-20 NOTE — Telephone Encounter (Signed)
Spoke with the patient in regards to her MyChart message:  Shelby, Nila to [facility] (supporting Jettie Booze, MD)     8:15 AM For the past two days I have been really nauseous I'm hurting in between my breast but up a little it feels like heart burn and ingestion but nothing I take makes a difference I'm also hurting in my neck on the right side going up towards my face it's hurt's off and on I can't sleep I'm really really tired The new medicine you put me on I haven't started yet so I know it's not that I am out of town right now and want be back till Monday What do you think it is and what do you think I should do?     Patient states that she is not currently having any chest pain. She states that she has been having on/off sharp pain in her chest. She thought it was heartburn and has been taking antacids with no relief. She states that she is also having pain in her neck. She reports being very nauseous for the past couple of days. She has been taking dramamine with no relief. She has not vomited. She reports some shortness of breath with exertion. She denies any dizziness. She is unable to take any of her vital signs. She has not experienced pain like this before. She is currently out of town in [place]. She reports that she did take a nitroglycerin earlier and it seemed to help. Patient is going to go to urgent care for evaluation.

## 2020-09-23 NOTE — Telephone Encounter (Signed)
Called pt to offer her a DOD spot on 09/26/20 at 11:30 am.  Pt did not answer phone I left a message requesting pt to call back.  I have also sent pt a my chart message.

## 2020-09-23 NOTE — Telephone Encounter (Signed)
Called pt in regards to my chart message.  This is her 2nd my chart messagePt expresses that she is having chest discomfort and is taking 1-2 nitroglycerin per day.  She reports that 1 NTG tablet helps pain at times but does not take it all the way away.  I advised her to take a 2nd dose if CP does not completely go away with 1 tablet.  I also advised her to go to the ED if CP continues.  She reports she went to Urgent care for an EKG and was told it looks okay.  Pt is requesting an OV with JV.  I advises her that he is not in office this week.  Will route to MD for recommendations.

## 2020-09-24 ENCOUNTER — Telehealth: Payer: Self-pay | Admitting: Interventional Cardiology

## 2020-09-24 MED ORDER — ISOSORBIDE MONONITRATE ER 30 MG PO TB24: ORAL_TABLET | ORAL | 3 refills | Status: DC

## 2020-09-24 NOTE — Telephone Encounter (Addendum)
I never ok OV for 09/26/20 with Dr. Acie Fredrickson.  I sent pt a My Chart message yesterday informing her that Dr. Irish Lack would like for her to start isosorbide mononitrate 15-30 mg PO QD.  I advised her that OV was not needed at this time.  I also advised her to call in this morning so I could go over medication instructions.  Pt called in and scheduled DOD appointment with scheduler anyway and did not want to cancel.  I told her it would be ok to try medication first and if it does not work have an [facility].  Pt insisted on keeping OV.   Jettie Booze, MD  You 15 hours ago (5:10 PM)   OK to try Imdur 30 mg daily.  Can start with half tab daily.  If no relief, would increase to full tab.     JV   Patient made aware of MD instructions.

## 2020-09-24 NOTE — Telephone Encounter (Signed)
Pt is currently scheduled to see DOD Nahser on 09/26/20 at 11:30 am per [facility]. Pt wants to know if her new medication Imdur 30 mg have been called in to the pharmacy. Please advise pt further

## 2020-09-26 ENCOUNTER — Ambulatory Visit: Payer: Managed Care, Other (non HMO) | Admitting: Cardiovascular Disease

## 2020-10-08 ENCOUNTER — Ambulatory Visit: Payer: Managed Care, Other (non HMO) | Admitting: Cardiovascular Disease

## 2020-10-14 ENCOUNTER — Other Ambulatory Visit: Payer: Managed Care, Other (non HMO)

## 2020-10-17 ENCOUNTER — Ambulatory Visit: Payer: Managed Care, Other (non HMO) | Admitting: Cardiology

## 2020-10-22 ENCOUNTER — Ambulatory Visit: Payer: Managed Care, Other (non HMO) | Admitting: Cardiovascular Disease

## 2020-10-28 ENCOUNTER — Ambulatory Visit: Payer: Managed Care, Other (non HMO) | Admitting: Diagnostic Neuroimaging

## 2020-11-12 ENCOUNTER — Ambulatory Visit: Payer: Managed Care, Other (non HMO) | Admitting: Cardiology

## 2020-11-18 ENCOUNTER — Ambulatory Visit: Payer: Managed Care, Other (non HMO) | Admitting: Cardiology

## 2020-12-02 ENCOUNTER — Encounter: Payer: Self-pay | Admitting: Diagnostic Neuroimaging

## 2020-12-03 ENCOUNTER — Telehealth: Payer: Self-pay | Admitting: *Deleted

## 2020-12-03 ENCOUNTER — Encounter: Payer: Self-pay | Admitting: *Deleted

## 2020-12-03 MED ORDER — AIMOVIG 70 MG/ML ~~LOC~~ SOAJ: 70.0000 mg | SUBCUTANEOUS | 4 refills | Status: DC

## 2020-12-03 NOTE — Telephone Encounter (Addendum)
Patient sent new insurance information. PA on CMM, key: [id number], [id number].  Your infAn electronic determination will be received in CoverMyMeds within 72-120 hours. You will receive a fax copy of the determination. If Stacy [name] has not responded in 120 hours, contact [employer] at [phone].

## 2020-12-03 NOTE — Telephone Encounter (Signed)
Unable to complete PA with current insurance information. I sent her my chart asking for updated.

## 2020-12-03 NOTE — Telephone Encounter (Signed)
[employer] approved: Case [id number]; Start Date:12/03/2020; Coverage End Date:06/02/2021. Sent her my chart to inform.

## 2020-12-03 NOTE — Telephone Encounter (Signed)
Meds ordered this encounter  Medications   Erenumab-aooe (AIMOVIG) 70 MG/ML SOAJ    Sig: Inject 70 mg into the skin every 30 (thirty) days.    Dispense:  3 mL    Refill:  [place], MD 22/29/7989, 2:11 AM Certified in Neurology, Neurophysiology and [facility] [address], [place], [place] [address] [phone]

## 2020-12-25 ENCOUNTER — Other Ambulatory Visit: Payer: Self-pay | Admitting: Interventional Cardiology

## 2021-01-23 ENCOUNTER — Other Ambulatory Visit: Payer: Self-pay | Admitting: Obstetrics and Gynecology

## 2021-01-23 DIAGNOSIS — Z1231 Encounter for screening mammogram for malignant neoplasm of breast: Secondary | ICD-10-CM

## 2021-03-03 ENCOUNTER — Encounter: Payer: Self-pay | Admitting: Interventional Cardiology

## 2021-03-03 MED ORDER — ROSUVASTATIN CALCIUM 20 MG PO TABS: ORAL_TABLET | ORAL | 3 refills | Status: DC

## 2021-03-07 ENCOUNTER — Encounter: Payer: Self-pay | Admitting: Physician Assistant

## 2021-03-07 ENCOUNTER — Ambulatory Visit: Payer: Managed Care, Other (non HMO) | Admitting: Physician Assistant

## 2021-03-07 ENCOUNTER — Other Ambulatory Visit (INDEPENDENT_AMBULATORY_CARE_PROVIDER_SITE_OTHER): Payer: Managed Care, Other (non HMO)

## 2021-03-07 VITALS — BP 140/90 | HR 60 | Ht 65.5 in | Wt 234.0 lb

## 2021-03-07 DIAGNOSIS — K5900 Constipation, unspecified: Secondary | ICD-10-CM

## 2021-03-07 LAB — BASIC METABOLIC PANEL
BUN: 11 mg/dL (ref 6–23)
CO2: 27 mEq/L (ref 19–32)
Calcium: 9 mg/dL (ref 8.4–10.5)
Chloride: 103 mEq/L (ref 96–112)
Creatinine, Ser: 0.76 mg/dL (ref 0.40–1.20)
GFR: 93.93 mL/min (ref >60.00)
Glucose, Bld: 108 mg/dL — ABNORMAL HIGH (ref 70–99)
Potassium: 3.5 mEq/L (ref 3.5–5.1)
Sodium: 139 mEq/L (ref 135–145)

## 2021-03-07 MED ORDER — TRULANCE 3 MG PO TABS: 3.0000 mg | ORAL_TABLET | Freq: Every day | ORAL | 0 refills | Status: DC

## 2021-03-07 NOTE — Patient Instructions (Signed)
If you are age 47 or younger, your body mass index should be between 19-25. Your Body mass index is 38.35 kg/m?Marland Kitchen If this is out of the aformentioned range listed, please consider follow up with your Primary Care Provider.  ?________________________________________________________ ? ?The [facility] providers would like to encourage you to use [facility] to communicate with providers for non-urgent requests or questions.  Due to long hold times on the telephone, sending your provider a message by [facility] may be a faster and more efficient way to get a response.  Please allow 48 business hours for a response.  Please remember that this is for non-urgent requests.  ?_______________________________________________________ ? ?You have been scheduled for a CT scan of the abdomen and pelvis at [facility] ([address]---this is in the same building as [employer]).  ? ?You are scheduled on 03/12/2021 at 3:30 pm. You should arrive 15 minutes prior to your appointment time for registration. Please follow the written instructions below on the day of your exam: ? ?WARNING: IF YOU ARE ALLERGIC TO IODINE/X-RAY DYE, PLEASE NOTIFY RADIOLOGY IMMEDIATELY AT [phone]! YOU WILL BE GIVEN A 13 HOUR PREMEDICATION PREP. ? ?1) Do not eat anything after 11:30 am (4 hours prior to your test) ?2) You have been given 2 bottles of oral contrast to drink. The solution may taste better if refrigerated, but do NOT add ice or any other liquid to this solution. Shake well before drinking. ?  ? Drink 1 bottle of contrast @ 1:30 pm (2 hours prior to your exam) ? Drink 1 bottle of contrast @ 2:30 pm (1 hour prior to your exam) ? ?You may take any medications as prescribed with a small amount of water, if necessary. If you take any of the following medications: METFORMIN, GLUCOPHAGE, GLUCOVANCE, AVANDAMET, RIOMET, FORTAMET, ACTOPLUS MET, JANUMET, [place] or METAGLIP, you MAY be asked to HOLD this medication 48 hours AFTER the  exam. ? ?The purpose of you drinking the oral contrast is to aid in the visualization of your intestinal tract. The contrast solution may cause some diarrhea. Depending on your individual set of symptoms, you may also receive an intravenous injection of x-ray contrast/dye. Plan on being at [facility] for 30 minutes or longer, depending on the type of exam you are having performed. ? ?This test typically takes 30-45 minutes to complete. ? ?If you have any questions regarding your exam or if you need to reschedule, you may call the CT department at [phone] between the hours of 8:00 am and 5:00 pm, Monday-Friday. ? ?________________________________________________________________________ ? ?Your provider has requested that you go to the basement level for lab work before leaving today. Press "B" on the elevator. The lab is located at the first door on the left as you exit the elevator. ? ?START Trulance 3 mg 1 tablet before breakfast. ?*We have given you samples today, call the office and speak with Beth to let us know how they are working so that we can send them in to your pharmacy. ? ?Amy Esterwood, PA-C  recommends that you complete a bowel purge (to clean out your bowels). Please do the following: ?Purchase a bottle of Miralax over the counter as well as a box of 5 mg dulcolax tablets. ?Take 4 dulcolax tablets. ?Wait 1 hour. ?You will then drink 6-8 capfuls of Miralax mixed in an adequate amount of water/juice/gatorade (you may choose which of these liquids to drink) over the next 2-3 hours. ?You should expect results within  1 to 6 hours after completing the bowel purge. ?*Start your Trulance the day after you have completed your bowel purge. ? ?Be sure to drink at least 60 ounces of water a day. ? ?Thank you for entrusting me with your care and choosing [facility]. ? ?Nicoletta Ba, PA-C ?

## 2021-03-07 NOTE — Progress Notes (Signed)
? ?Subjective:  ? ? Patient ID: Stacy [name], female    DOB: 11-12-1975, 46 y.o.   MRN: [id number] ? ?HPI ?Stacy [name] is a pleasant 46 year old white female, new today referred by Dr. Louis Matte Sneha/[facility] for evaluation of severe constipation. ?Patient says that she had prior colonoscopy done with Dr. Cristina Gong in 2019 and was told this was negative.  She thinks she also had 1 colonoscopy prior to that also done at [facility] She may have had a polyp. ? ?She reports new onset of constipation which has been severe over the past 1 year.  Prior to that she does not recall having significant issues with constipation.  She does have history of endometriosis and had laparoscopy with fulguration several years ago.  She also had complication of torsion of the fallopian tube while she was pregnant and had exploratory lap for that. ?Recent TSH normal.  She had been tried on Linzess 290 mcg daily and had absolutely no results from that.  She has tried taking MiraLAX on a regular basis which she says does not produce any bowel movements.  She has tried fleets enemas etc. again usually with no results.  She is frequently going a week and 1/2 to 2 weeks between bowel movements and feels very full and miserable.  She occasionally gets nauseated but has not had any vomiting.  She usually feels more uncomfortable after eating.  Occasionally getting some sharp pains in the right lower abdomen.  No melena or hematochezia. ?She has not been started on any new medications over the past year or so nor had any dosage changes. ?Family history is pertinent for precancerous polyps in both her mother and her father ? ?Patient has history of depression, restless leg syndrome, hyperlipidemia, fibromyalgia, migraines, hypothyroidism and nonobstructive coronary artery disease. ? ? ?Review of Systems Pertinent positive and negative review of systems were noted in the above HPI section.  All other review of systems was otherwise negative.   ? ?Outpatient Encounter Medications as of 03/07/2021  ?Medication Sig  ? aspirin EC 81 MG tablet Take 1 tablet (81 mg total) by mouth daily. Swallow whole.  ? busPIRone (BUSPAR) 30 MG tablet Take 30 mg by mouth 2 (two) times daily.  ? DULoxetine (CYMBALTA) 60 MG capsule Take 60 mg by mouth at bedtime.  ? gabapentin (NEURONTIN) 300 MG capsule Take 300 mg by mouth 2 (two) times daily.  ? levothyroxine (SYNTHROID) 75 MCG tablet Take 75 mcg by mouth daily before breakfast.  ? Plecanatide (TRULANCE) 3 MG TABS Take 3 mg by mouth daily.  ? pramipexole (MIRAPEX) 1 MG tablet Take 1 mg by mouth at bedtime.  ? rosuvastatin (CRESTOR) 20 MG tablet Take 1/2 tablet (10 mg) by mouth once a week  ? torsemide (DEMADEX) 10 MG tablet TAKE 1 TABLET BY MOUTH ONCE DAILY AS NEEDED FOR  SWELLING  **STOP  FUROSEMIDE**  ? nitroGLYCERIN (NITROSTAT) 0.4 MG SL tablet Place 1 tablet (0.4 mg total) under the tongue every 5 (five) minutes as needed for chest pain. If you require more than two tablets five minutes apart go to the nearest ER via EMS.  ? [DISCONTINUED] LINZESS 290 MCG CAPS capsule Take 290 mcg by mouth every morning.  ? ?No facility-administered encounter medications on file as of 03/07/2021.  ? ?No Known Allergies ?Patient Active Problem List  ? Diagnosis Date Noted  ? Angina pectoris ([place])   ? Precordial pain   ? Mixed dyslipidemia 02/12/2020  ? Tremor 04/17/2019  ?  Chest tightness 03/24/2013  ? Acute URI 03/24/2013  ? RESTLESS LEG SYNDROME 08/15/2009  ? DEPRESSIVE DISORDER 09/26/2008  ? Unspecified hypothyroidism 08/03/2008  ? ?Social History  ? ?Socioeconomic History  ? Marital status: Married  ?  Spouse name: Not on file  ? Number of children: 2  ? Years of education: 36  ? Highest education level: Not on file  ?Occupational History  ? 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 Smoking status: Never  ? Smokeless tobacco: Never  ?Vaping Use  ? Vaping Use: Never used  ?Substance and Sexual Activity  ? Alcohol use:  Never  ? Drug use: Never  ? Sexual activity: Yes  ?  Birth control/protection: Surgical  ?Other Topics Concern  ? Not on file  ?Social History Narrative  ? Married  ? Regular exercise-yes  ? Does not work outside the home  ? Caffeine 1-2 daily  ? ?Social Determinants of Health  ? ?Financial Resource Strain: Not on file  ?Food Insecurity: Not on file  ?Transportation Needs: Not on file  ?Physical Activity: Not on file  ?Stress: Not on file  ?Social Connections: Not on file  ?Intimate Partner Violence: Not on file  ? ? ?Ms. Spieler's family history includes Bladder Cancer in her father; Colon cancer in her maternal uncle; Diabetes in her mother; Stacy [name]' disease in her sister; Heart attack in her paternal grandfather; Heart disease in her father; Hyperlipidemia in her father and mother; Hypertension in her father and mother; Hypothyroidism in her father; Stroke in her father; Thyroid disease in her mother. ? ? ?   ?Objective:  ?  ?Vitals:  ? 03/07/21 1046  ?BP: 140/90  ?Pulse: 60  ? ? ?Physical Exam.Well-developed well-nourished  WF in no acute distress.  Height, EXHBZJ,696  BMI38 ? ?HEENT; nontraumatic normocephalic, EOMI, PE R LA, sclera anicteric. ?Oropharynx; not examined today ?Neck; supple, no JVD ?Cardiovascular; regular rate and rhythm with S1-S2, no murmur rub or gallop ?Pulmonary; Clear bilaterally ?Abdomen; soft, obese, no focal tenderness , protuberant, abdomen full feeling,, no palpable mass or hepatosplenomegaly, bowel sounds are active ?Rectal; no stool in the rectal vault, heme-negative ?Skin; benign exam, no jaundice rash or appreciable lesions ?Extremities; no clubbing cyanosis or edema skin warm and dry ?Neuro/Psych; alert and oriented x4, grossly nonfocal mood and affect appropriate  ? ? ? ?   ?Assessment & Plan:  ? ?#52 47 year old white female with new onset of constipation 1 year ago, progressively severe and frequently going 1-1/2 to 2 weeks between bowel movements. ?She complains of generalized  abdominal discomfort ,fullness. ?No response to MiraLAX or high-dose Linzess ?Recent TSH normal ? ?Prior colonoscopy 2019/Dr. Buccini/[facility] negative per patient ? ? ?Etiology of severe constipation is not entirely clear, rule out severe functional constipation, likely some contribution secondary to her medications though none have changed over the past year or so.  Rule out other intra-abdominal inflammatory process/adhesions, neoplasm ? ?#2 family history of precancerous polyps in mother and father ?#3 hypothyroidism ?4.  Fibromyalgia ?5.  Nonobstructive coronary artery disease ?6.  Hyperlipidemia ?7.  Depression ?8.  Restless leg syndrome ?9.  Previous history of endometriosis ? ?Plan; Will start with a MiraLAX bowel purge, then start trial of Trulance 3 mg p.o. daily, samples were given today and if this is effective will send prescription. ?We will schedule for CT of the abdomen and pelvis with contrast ?Patient has signed a release and will obtain prior colonoscopy and path results from [facility], then determine appropriate  interval for follow-up colonoscopy.  May be appropriate at 5-year interval given family history. ? ?Patient instructed to drink at least 60 ounces of water daily ?Patient will be established with Dr. Silverio Decamp. ? ?Alfredia Ferguson PA-C ?03/07/2021 ? ? ?Cc: Pahwani, Michell Heinrich, MD ?  ?

## 2021-03-10 ENCOUNTER — Telehealth: Payer: Self-pay | Admitting: Physician Assistant

## 2021-03-10 ENCOUNTER — Ambulatory Visit: Payer: Managed Care, Other (non HMO)

## 2021-03-10 NOTE — Telephone Encounter (Signed)
CT SCAN ([id number]) ? ?Case was denied by [employer] due to: colonoscopy results that show a reason this request is needed. Your provider needs to suspect your bowels are blocked. A peer to peer can be scheduled to overturn this decision, please advise. ?

## 2021-03-11 NOTE — Telephone Encounter (Signed)
Peer-to peer has been scheduled for 3/13/223 at 3:15. ?

## 2021-03-12 ENCOUNTER — Inpatient Hospital Stay: Admission: RE | Admit: 2021-03-12 | Payer: Managed Care, Other (non HMO) | Source: Ambulatory Visit

## 2021-03-12 ENCOUNTER — Ambulatory Visit: Payer: Managed Care, Other (non HMO)

## 2021-03-13 ENCOUNTER — Other Ambulatory Visit: Payer: Self-pay

## 2021-03-13 ENCOUNTER — Telehealth: Payer: Self-pay

## 2021-03-13 MED ORDER — TRULANCE 3 MG PO TABS: 3.0000 mg | ORAL_TABLET | Freq: Every day | ORAL | 3 refills | Status: DC

## 2021-03-13 NOTE — Telephone Encounter (Signed)
Prior Authorization has been started on cover my meds. ?

## 2021-03-13 NOTE — Telephone Encounter (Signed)
Patient calls. Trulance samples taken and she feels they work well. Rx to [facility] in [place]. She is aware her insurance will want a prior authorization done which will delay getting the prescription.  ?

## 2021-03-14 NOTE — Telephone Encounter (Signed)
Patient advised. She will follow up by phone next week with her progress report. ?

## 2021-03-14 NOTE — Telephone Encounter (Signed)
If she is having diarrhea avoid taking it for a day but if diarrhea recurs we will need to consider switching to alternate laxatives. Ok to send Rx for 30 days for Trulance '3mg'$  daily ?

## 2021-03-14 NOTE — Telephone Encounter (Signed)
Patient was seen by Nicoletta Ba 03/07/21. ?Patient calls about the Trulance. She had felt it was working well and asked for the prescription to be sent to the pharmacy. Today she took it as her usual. She has not had one good bowel movement. Instead she has had loose bowel movements every time she goes to the bathroom even to just urinate. Her stomach hurts a little as well. She had purged as instructed on 03/08/21 and started Trulance the next day. Should she continue taking Trulance, skip a day or what? ?Thanks ?

## 2021-03-14 NOTE — Telephone Encounter (Signed)
Patient called and stated that she has a question about Trulance. Please advise.  ?

## 2021-03-17 MED ORDER — LUBIPROSTONE 24 MCG PO CAPS: 24.0000 ug | ORAL_CAPSULE | Freq: Two times a day (BID) | ORAL | 4 refills | Status: DC

## 2021-03-17 NOTE — Telephone Encounter (Signed)
Amitiza has been sent to patient's pharmacy. Patient has been contacted and advised that her insurance requires [place] to switch before they will cover Trulance. Patient has expressed understanding and will call the office in 2-3 weeks with an update. ?

## 2021-03-17 NOTE — Telephone Encounter (Signed)
Trulance is being denied. Her insurance needs her to fail both Brand Amitiza and Brand Linzzes. She has already failed Linzess ?

## 2021-03-17 NOTE — Telephone Encounter (Signed)
Inbound call from [facility] requesting to speak with Amy  for the peer-to-peer.  Can return call to [phone] option 4. ?

## 2021-03-18 ENCOUNTER — Other Ambulatory Visit: Payer: Self-pay

## 2021-03-18 ENCOUNTER — Ambulatory Visit (INDEPENDENT_AMBULATORY_CARE_PROVIDER_SITE_OTHER)
Admission: RE | Admit: 2021-03-18 | Discharge: 2021-03-18 | Disposition: A | Discharge status: Home / Self Care | Payer: Managed Care, Other (non HMO) | Source: Ambulatory Visit | Attending: Physician Assistant | Admitting: Physician Assistant

## 2021-03-18 DIAGNOSIS — K5900 Constipation, unspecified: Secondary | ICD-10-CM | POA: Diagnosis not present

## 2021-03-18 MED ORDER — IOHEXOL 300 MG/ML  SOLN
100.0000 mL | Freq: Once | INTRAMUSCULAR | Status: AC | PRN
  Administered 2021-03-18: 100 mL via INTRAVENOUS

## 2021-03-19 ENCOUNTER — Other Ambulatory Visit: Payer: Self-pay

## 2021-03-19 ENCOUNTER — Telehealth: Payer: Self-pay

## 2021-03-19 ENCOUNTER — Other Ambulatory Visit (INDEPENDENT_AMBULATORY_CARE_PROVIDER_SITE_OTHER): Payer: Managed Care, Other (non HMO)

## 2021-03-19 DIAGNOSIS — R935 Abnormal findings on diagnostic imaging of other abdominal regions, including retroperitoneum: Secondary | ICD-10-CM | POA: Diagnosis not present

## 2021-03-19 DIAGNOSIS — R161 Splenomegaly, not elsewhere classified: Secondary | ICD-10-CM | POA: Diagnosis not present

## 2021-03-19 DIAGNOSIS — R160 Hepatomegaly, not elsewhere classified: Secondary | ICD-10-CM

## 2021-03-19 LAB — CBC WITH DIFFERENTIAL/PLATELET
Basophils Absolute: 0.1 10*3/uL (ref 0.0–0.1)
Basophils Relative: 0.9 % (ref 0.0–3.0)
Eosinophils Absolute: 0.1 10*3/uL (ref 0.0–0.7)
Eosinophils Relative: 1.6 % (ref 0.0–5.0)
HCT: 38.1 % (ref 36.0–46.0)
Hemoglobin: 12.7 g/dL (ref 12.0–15.0)
Lymphocytes Relative: 30.6 % (ref 12.0–46.0)
Lymphs Abs: 2 10*3/uL (ref 0.7–4.0)
MCHC: 33.3 g/dL (ref 30.0–36.0)
MCV: 90.1 fl (ref 78.0–100.0)
Monocytes Absolute: 0.4 10*3/uL (ref 0.1–1.0)
Monocytes Relative: 6.7 % (ref 3.0–12.0)
Neutro Abs: 4 10*3/uL (ref 1.4–7.7)
Neutrophils Relative %: 60.2 % (ref 43.0–77.0)
Platelets: 350 10*3/uL (ref 150.0–400.0)
RBC: 4.23 Mil/uL (ref 3.87–5.11)
RDW: 13.2 % (ref 11.5–15.5)
WBC: 6.6 10*3/uL (ref 4.0–10.5)

## 2021-03-19 LAB — COMPREHENSIVE METABOLIC PANEL
ALT: 20 U/L (ref 0–35)
AST: 15 U/L (ref 0–37)
Albumin: 4.5 g/dL (ref 3.5–5.2)
Alkaline Phosphatase: 94 U/L (ref 39–117)
BUN: 11 mg/dL (ref 6–23)
CO2: 28 mEq/L (ref 19–32)
Calcium: 9.4 mg/dL (ref 8.4–10.5)
Chloride: 100 mEq/L (ref 96–112)
Creatinine, Ser: 0.91 mg/dL (ref 0.40–1.20)
GFR: 75.65 mL/min (ref >60.00)
Glucose, Bld: 131 mg/dL — ABNORMAL HIGH (ref 70–99)
Potassium: 3.6 mEq/L (ref 3.5–5.1)
Sodium: 137 mEq/L (ref 135–145)
Total Bilirubin: 0.4 mg/dL (ref 0.2–1.2)
Total Protein: 7.2 g/dL (ref 6.0–8.3)

## 2021-03-19 LAB — SEDIMENTATION RATE: Sed Rate: 26 mm/hr — ABNORMAL HIGH (ref 0–20)

## 2021-03-19 LAB — HIGH SENSITIVITY CRP: CRP, High Sensitivity: 5.06 mg/L — ABNORMAL HIGH (ref 0.000–5.000)

## 2021-03-19 MED ORDER — LUBIPROSTONE 24 MCG PO CAPS: 24.0000 ug | ORAL_CAPSULE | Freq: Two times a day (BID) | ORAL | 4 refills | Status: DC

## 2021-03-19 NOTE — Telephone Encounter (Signed)
The patient's CT report is back and she has an active My Chart. She is inquiring about the results. Would you review and respond through the usual methods. I have advised her there may be a delay. ?

## 2021-03-20 ENCOUNTER — Other Ambulatory Visit: Payer: Self-pay | Admitting: Neurology

## 2021-03-20 ENCOUNTER — Other Ambulatory Visit: Payer: Self-pay

## 2021-03-20 ENCOUNTER — Other Ambulatory Visit (HOSPITAL_COMMUNITY): Payer: Self-pay | Admitting: Neurology

## 2021-03-20 LAB — PROTIME-INR
INR: 0.9 ratio (ref 0.8–1.0)
Prothrombin Time: 10.5 s (ref 9.6–13.1)

## 2021-03-20 MED ORDER — LUBIPROSTONE 24 MCG PO CAPS: 24.0000 ug | ORAL_CAPSULE | Freq: Two times a day (BID) | ORAL | 4 refills | Status: DC

## 2021-03-20 NOTE — Progress Notes (Signed)
Please check if radiology can do nuclear med liver spleen scan, if not will need RUQ abdominal ultrasound with Doppler and FibroScan will help to determine if she has portal hypertension or any portal vein thrombus ?

## 2021-03-25 ENCOUNTER — Inpatient Hospital Stay: Admission: RE | Admit: 2021-03-25 | Payer: Managed Care, Other (non HMO) | Source: Ambulatory Visit

## 2021-03-28 ENCOUNTER — Other Ambulatory Visit: Payer: Self-pay

## 2021-03-28 ENCOUNTER — Ambulatory Visit (HOSPITAL_COMMUNITY)
Admission: RE | Admit: 2021-03-28 | Discharge: 2021-03-28 | Disposition: A | Discharge status: Home / Self Care | Payer: Managed Care, Other (non HMO) | Source: Ambulatory Visit | Attending: Gastroenterology | Admitting: Gastroenterology

## 2021-03-28 DIAGNOSIS — R935 Abnormal findings on diagnostic imaging of other abdominal regions, including retroperitoneum: Secondary | ICD-10-CM | POA: Diagnosis present

## 2021-03-28 DIAGNOSIS — R161 Splenomegaly, not elsewhere classified: Secondary | ICD-10-CM | POA: Insufficient documentation

## 2021-03-28 DIAGNOSIS — R160 Hepatomegaly, not elsewhere classified: Secondary | ICD-10-CM | POA: Diagnosis present

## 2021-03-28 MED ORDER — TECHNETIUM TC 99M SULFUR COLLOID
4.1000 | Freq: Once | INTRAVENOUS | Status: AC
  Administered 2021-03-28: 4.1 via INTRAVENOUS

## 2021-04-01 ENCOUNTER — Other Ambulatory Visit: Payer: Self-pay

## 2021-04-01 MED ORDER — TRULANCE 3 MG PO TABS: 1.0000 | ORAL_TABLET | Freq: Every day | ORAL | 3 refills | Status: DC

## 2021-04-01 NOTE — Progress Notes (Signed)
Reviewed and agree with documentation and assessment and plan. K. Veena Berdell Hostetler , MD   

## 2021-04-23 ENCOUNTER — Ambulatory Visit (INDEPENDENT_AMBULATORY_CARE_PROVIDER_SITE_OTHER)
Admission: RE | Admit: 2021-04-23 | Discharge: 2021-04-23 | Disposition: A | Discharge status: Home / Self Care | Payer: Managed Care, Other (non HMO) | Source: Ambulatory Visit | Attending: Physician Assistant | Admitting: Physician Assistant

## 2021-04-23 ENCOUNTER — Other Ambulatory Visit: Payer: Self-pay

## 2021-04-23 DIAGNOSIS — R161 Splenomegaly, not elsewhere classified: Secondary | ICD-10-CM

## 2021-04-23 DIAGNOSIS — R160 Hepatomegaly, not elsewhere classified: Secondary | ICD-10-CM | POA: Diagnosis not present

## 2021-04-30 ENCOUNTER — Ambulatory Visit (HOSPITAL_COMMUNITY): Payer: Managed Care, Other (non HMO)

## 2021-04-30 ENCOUNTER — Encounter (HOSPITAL_COMMUNITY): Payer: Self-pay

## 2021-05-08 ENCOUNTER — Other Ambulatory Visit: Payer: Self-pay

## 2021-05-08 ENCOUNTER — Ambulatory Visit (AMBULATORY_SURGERY_CENTER): Payer: Self-pay

## 2021-05-08 ENCOUNTER — Encounter: Payer: Self-pay | Admitting: Gastroenterology

## 2021-05-08 VITALS — Ht 65.5 in | Wt 220.0 lb

## 2021-05-08 DIAGNOSIS — K5900 Constipation, unspecified: Secondary | ICD-10-CM

## 2021-05-08 MED ORDER — NA SULFATE-K SULFATE-MG SULF 17.5-3.13-1.6 GM/177ML PO SOLN: 1.0000 | Freq: Once | ORAL | 0 refills | Status: AC

## 2021-05-08 NOTE — Progress Notes (Signed)
Denies allergies to eggs or soy products. Denies complication of anesthesia or sedation. Denies use of weight loss medication. Denies use of O2.   Emmi instructions given for colonoscopy.  

## 2021-05-14 ENCOUNTER — Encounter: Payer: Self-pay | Admitting: Gastroenterology

## 2021-05-14 ENCOUNTER — Ambulatory Visit (AMBULATORY_SURGERY_CENTER): Payer: Managed Care, Other (non HMO) | Admitting: Gastroenterology

## 2021-05-14 VITALS — BP 123/66 | HR 82 | Temp 97.7°F | Resp 11 | Ht 65.0 in | Wt 220.0 lb

## 2021-05-14 DIAGNOSIS — K621 Rectal polyp: Secondary | ICD-10-CM

## 2021-05-14 DIAGNOSIS — D128 Benign neoplasm of rectum: Secondary | ICD-10-CM

## 2021-05-14 DIAGNOSIS — Z1211 Encounter for screening for malignant neoplasm of colon: Secondary | ICD-10-CM | POA: Diagnosis present

## 2021-05-14 MED ORDER — SODIUM CHLORIDE 0.9 % IV SOLN: 500.0000 mL | Freq: Once | INTRAVENOUS | Status: DC

## 2021-05-14 NOTE — Progress Notes (Signed)
Report to PACU, RN, vss, BBS= Clear.  

## 2021-05-14 NOTE — Patient Instructions (Signed)
YOU HAD AN ENDOSCOPIC PROCEDURE TODAY AT THE [facility]:   Refer to the procedure report that was given to you for any specific questions about what was found during the examination.  If the procedure report does not answer your questions, please call your gastroenterologist to clarify.  If you requested that your care partner not be given the details of your procedure findings, then the procedure report has been included in a sealed envelope for you to review at your convenience later.  YOU SHOULD EXPECT: Some feelings of bloating in the abdomen. Passage of more gas than usual.  Walking can help get rid of the air that was put into your GI tract during the procedure and reduce the bloating. If you had a lower endoscopy (such as a colonoscopy or flexible sigmoidoscopy) you may notice spotting of blood in your stool or on the toilet paper. If you underwent a bowel prep for your procedure, you may not have a normal bowel movement for a few days.  Please Note:  You might notice some irritation and congestion in your nose or some drainage.  This is from the oxygen used during your procedure.  There is no need for concern and it should clear up in a day or so.  SYMPTOMS TO REPORT IMMEDIATELY:   Following lower endoscopy (colonoscopy or flexible sigmoidoscopy):  Excessive amounts of blood in the stool  Significant tenderness or worsening of abdominal pains  Swelling of the abdomen that is new, acute  Fever of 100F or higher   Following upper endoscopy (EGD)  Vomiting of blood or coffee ground material  New chest pain or pain under the shoulder blades  Painful or persistently difficult swallowing  New shortness of breath  Fever of 100F or higher  Black, tarry-looking stools  For urgent or emergent issues, a gastroenterologist can be reached at any hour by calling [phone]. Do not use MyChart messaging for urgent concerns.    DIET:  We do recommend a small meal at first, but  then you may proceed to your regular diet.  Drink plenty of fluids but you should avoid alcoholic beverages for 24 hours.  ACTIVITY:  You should plan to take it easy for the rest of today and you should NOT DRIVE or use heavy machinery until tomorrow (because of the sedation medicines used during the test).    FOLLOW UP: Our staff will call the number listed on your records 48-72 hours following your procedure to check on you and address any questions or concerns that you may have regarding the information given to you following your procedure. If we do not reach you, we will leave a message.  We will attempt to reach you two times.  During this call, we will ask if you have developed any symptoms of COVID 19. If you develop any symptoms (ie: fever, flu-like symptoms, shortness of breath, cough etc.) before then, please call [phone].  If you test positive for Covid 19 in the 2 weeks post procedure, please call and report this information to us.    If any biopsies were taken you will be contacted by phone or by letter within the next 1-3 weeks.  Please call us at [phone] if you have not heard about the biopsies in 3 weeks.    SIGNATURES/CONFIDENTIALITY: You and/or your care partner have signed paperwork which will be entered into your electronic medical record.  These signatures attest to the fact that that the information above on   your After Visit Summary has been reviewed and is understood.  Full responsibility of the confidentiality of this discharge information lies with you and/or your care-partner. 

## 2021-05-14 NOTE — Progress Notes (Signed)
Pt's states no medical or surgical changes since previsit or office visit. 

## 2021-05-14 NOTE — Op Note (Signed)
[place] ?Patient Name: Stacy [name] ?Procedure Date: 05/14/2021 9:09 AM ?MRN: [id number] ?Endoscopist: Mauri Pole , MD ?Age: 46 ?Referring MD:  ?Date of Birth: 09/25/1975 ?Gender: Female ?Account #: [id number] ?Procedure:                Colonoscopy ?Indications:              Screening for colorectal malignant neoplasm ?Medicines:                Monitored Anesthesia Care ?Procedure:                Pre-Anesthesia Assessment: ?                          - Prior to the procedure, a History and Physical  ?                          was performed, and patient medications and  ?                          allergies were reviewed. The patient's tolerance of  ?                          previous anesthesia was also reviewed. The risks  ?                          and benefits of the procedure and the sedation  ?                          options and risks were discussed with the patient.  ?                          All questions were answered, and informed consent  ?                          was obtained. Prior Anticoagulants: The patient has  ?                          taken no previous anticoagulant or antiplatelet  ?                          agents. ASA Grade Assessment: II - A patient with  ?                          mild systemic disease. After reviewing the risks  ?                          and benefits, the patient was deemed in  ?                          satisfactory condition to undergo the procedure. ?                          After obtaining informed consent, the colonoscope  ?  was passed under direct vision. Throughout the  ?                          procedure, the patient's blood pressure, pulse, and  ?                          oxygen saturations were monitored continuously. The  ?                          [id number]-HQ190L Colonoscope was introduced through the  ?                          anus and advanced to the the cecum, identified by  ?                           appendiceal orifice and ileocecal valve. The  ?                          patient tolerated the procedure well. The quality  ?                          of the bowel preparation was excellent. The  ?                          ileocecal valve, appendiceal orifice, and rectum  ?                          were photographed. The colonoscopy was technically  ?                          difficult and complex due to restricted mobility of  ?                          the colon. Successful completion of the procedure  ?                          was aided by applying abdominal pressure. ?Scope In: 9:17:47 AM ?Scope Out: 9:48:11 AM ?Scope Withdrawal Time: 0 hours 6 minutes 35 seconds  ?Total Procedure Duration: 0 hours 30 minutes 24 seconds  ?Findings:                 The perianal and digital rectal examinations were  ?                          normal. ?                          A 3 mm polyp was found in the rectum. The polyp was  ?                          sessile. The polyp was removed with a cold snare.  ?                          Resection and retrieval were complete. ?  Non-bleeding external and internal hemorrhoids were  ?                          found during retroflexion. The hemorrhoids were  ?                          small. ?                          The exam was otherwise without abnormality. ?Complications:            No immediate complications. ?Estimated Blood Loss:     Estimated blood loss was minimal. ?Impression:               - One 3 mm polyp in the rectum, removed with a cold  ?                          snare. Resected and retrieved. ?                          - Non-bleeding external and internal hemorrhoids. ?                          - The examination was otherwise normal. ?Recommendation:           - Patient has a contact number available for  ?                          emergencies. The signs and symptoms of potential  ?                          delayed complications were discussed  with the  ?                          patient. Return to normal activities tomorrow.  ?                          Written discharge instructions were provided to the  ?                          patient. ?                          - Resume previous diet. ?                          - Continue present medications. ?                          - Await pathology results. ?                          - Repeat colonoscopy in 5-10 years for surveillance  ?                          based on pathology results. ?                          -  Return to GI clinic at the next available  ?                          appointment in 3 months for follow up of  ?                          constipation and elevated inflammatory markers ?Mauri Pole, MD ?05/14/2021 9:58:39 AM ?This report has been signed electronically. ?

## 2021-05-14 NOTE — Progress Notes (Signed)
Called to room to assist during endoscopic procedure.  Patient ID and intended procedure confirmed with present staff. Received instructions for my participation in the procedure from the performing physician.  

## 2021-05-14 NOTE — Progress Notes (Signed)
[facility] History and Physical ? ? ?Primary Care Physician:  Charlane Ferretti, MD ? ? ?Reason for Procedure:  Colorectal cancer screening ? ?Plan:    Screening colonoscopy with possible interventions as needed ? ? ? ? ?HPI: Stacy [name] is a very pleasant 46 y.o. female here for screening colonoscopy. ?Denies any nausea, vomiting, abdominal pain, melena or bright red blood per rectum ? ?The risks and benefits as well as alternatives of endoscopic procedure(s) have been discussed and reviewed. All questions answered. The patient agrees to proceed. ? ? ? ?Past Medical History:  ?Diagnosis Date  ? Anxiety   ? Depressive disorder   ? Fibromyalgia   ? Hypertriglyceridemia   ? Hypoglycemia   ? Hypokalemia   ? Hypothyroidism   ? Migraine   ? Nonobstructive atherosclerosis of coronary artery   ? Numbness and tingling   ? hands  ? RLS (restless legs syndrome)   ? Seizures ([place])   ? x 1 in school  ? Tremor   ? hands  ? ? ?Past Surgical History:  ?Procedure Laterality Date  ? ABDOMINAL HYSTERECTOMY  2005  ? has her right ovary left she thinks  ? BREAST BIOPSY Left 09/27/2014  ? [place]  ? ? ?Prior to Admission medications   ?Medication Sig Start Date End Date Taking? Authorizing Provider  ?aspirin EC 81 MG tablet Take 1 tablet (81 mg total) by mouth daily. Swallow whole. 04/17/20  Yes Tolia, Sunit, DO  ?busPIRone (BUSPAR) 30 MG tablet Take 30 mg by mouth 2 (two) times daily. 04/19/20  Yes [provider]  ?DULoxetine (CYMBALTA) 60 MG capsule Take 60 mg by mouth at bedtime. 05/15/20  Yes [provider]  ?gabapentin (NEURONTIN) 300 MG capsule Take 300 mg by mouth 2 (two) times daily. 04/22/20  Yes [provider]  ?levothyroxine (SYNTHROID) 75 MCG tablet Take 75 mcg by mouth daily before breakfast.   Yes [provider]  ?Plecanatide (TRULANCE) 3 MG TABS Take 1 tablet by mouth daily. 04/01/21  Yes Esterwood, Amy S, PA-C  ?pramipexole (MIRAPEX) 1 MG tablet Take 1 mg by mouth at  bedtime. 01/04/19  Yes [provider]  ?rosuvastatin (CRESTOR) 20 MG tablet Take 1/2 tablet (10 mg) by mouth once a week 03/03/21  Yes Jettie Booze, MD  ?nitroGLYCERIN (NITROSTAT) 0.4 MG SL tablet Place 1 tablet (0.4 mg total) under the tongue every 5 (five) minutes as needed for chest pain. If you require more than two tablets five minutes apart go to the nearest ER via EMS. 04/01/20 08/24/20  Tolia, Sunit, DO  ?torsemide (DEMADEX) 10 MG tablet TAKE 1 TABLET BY MOUTH ONCE DAILY AS NEEDED FOR  SWELLING  **STOP  FUROSEMIDE** 12/25/20   Jettie Booze, MD  ? ? ?Current Outpatient Medications  ?Medication Sig Dispense Refill  ? aspirin EC 81 MG tablet Take 1 tablet (81 mg total) by mouth daily. Swallow whole. 90 tablet 3  ? busPIRone (BUSPAR) 30 MG tablet Take 30 mg by mouth 2 (two) times daily.    ? DULoxetine (CYMBALTA) 60 MG capsule Take 60 mg by mouth at bedtime.    ? gabapentin (NEURONTIN) 300 MG capsule Take 300 mg by mouth 2 (two) times daily.    ? levothyroxine (SYNTHROID) 75 MCG tablet Take 75 mcg by mouth daily before breakfast.    ? Plecanatide (TRULANCE) 3 MG TABS Take 1 tablet by mouth daily. 90 tablet 3  ? pramipexole (MIRAPEX) 1 MG tablet Take 1 mg by  mouth at bedtime.    ? rosuvastatin (CRESTOR) 20 MG tablet Take 1/2 tablet (10 mg) by mouth once a week 12 tablet 3  ? nitroGLYCERIN (NITROSTAT) 0.4 MG SL tablet Place 1 tablet (0.4 mg total) under the tongue every 5 (five) minutes as needed for chest pain. If you require more than two tablets five minutes apart go to the nearest ER via EMS. 30 tablet 0  ? torsemide (DEMADEX) 10 MG tablet TAKE 1 TABLET BY MOUTH ONCE DAILY AS NEEDED FOR  SWELLING  **STOP  FUROSEMIDE** 90 tablet 1  ? ?Current Facility-Administered Medications  ?Medication Dose Route Frequency Provider Last Rate Last Admin  ? 0.9 %  sodium chloride infusion  500 mL Intravenous Once Terrica Duecker, Venia Minks, MD      ? ? ?Allergies as of 05/14/2021  ? (No Known Allergies)   ? ? ?Family History  ?Problem Relation Age of Onset  ? Colon polyps Mother   ? Diabetes Mother   ? Hyperlipidemia Mother   ? Hypertension Mother   ? Thyroid disease Mother   ? Colon polyps Father   ? Bladder Cancer Father   ?     smoker  ? Stroke Father   ? Hyperlipidemia Father   ? Heart disease Father   ? Hypothyroidism Father   ? Hypertension Father   ? Graves' disease Sister   ? Colon cancer Maternal Uncle   ? Colon polyps Maternal Grandmother   ? Colon polyps Paternal Grandfather   ? Heart attack Paternal Grandfather   ? Breast cancer Neg Hx   ? Esophageal cancer Neg Hx   ? Rectal cancer Neg Hx   ? Stomach cancer Neg Hx   ? ? ?Social History  ? ?Socioeconomic History  ? Marital status: Married  ?  Spouse name: Not on file  ? Number of children: 2  ? Years of education: 9  ? Highest education level: Not on file  ?Occupational History  ? 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 Smoking status: Never  ? Smokeless tobacco: Never  ?Vaping Use  ? Vaping Use: Never used  ?Substance and Sexual Activity  ? Alcohol use: Yes  ?  Comment: rarely  ? Drug use: Never  ? Sexual activity: Yes  ?  Birth control/protection: Surgical  ?Other Topics Concern  ? Not on file  ?Social History Narrative  ? Married  ? Regular exercise-yes  ? Does not work outside the home  ? Caffeine 1-2 daily  ? ?Social Determinants of Health  ? ?Financial Resource Strain: Not on file  ?Food Insecurity: Not on file  ?Transportation Needs: Not on file  ?Physical Activity: Not on file  ?Stress: Not on file  ?Social Connections: Not on file  ?Intimate Partner Violence: Not on file  ? ? ?Review of Systems: ? ?All other review of systems negative except as mentioned in the HPI. ? ?Physical Exam: ?Vital signs in last 24 hours: ?BP (!) 156/91   Pulse 90   Temp 97.7 ?F (36.5 ?C)   Ht '5\' 5"'$  (1.651 m)   Wt 220 lb (99.8 kg)   SpO2 98%   BMI 36.61 kg/m?  ?General:   Alert, NAD ?Lungs:  Clear .   ?Heart:  Regular rate and  rhythm ?Abdomen:  Soft, nontender and nondistended. ?Neuro/Psych:  Alert and cooperative. Normal mood and affect. A and O x 3 ? ?Reviewed labs, radiology imaging, old records and pertinent past GI work up ? ?Patient is appropriate for  planned procedure(s) and anesthesia in an ambulatory setting ? ? ?K. Denzil Magnuson , MD ?[phone]  ? ? ?  ?

## 2021-05-15 ENCOUNTER — Encounter: Payer: Self-pay | Admitting: Gastroenterology

## 2021-05-16 ENCOUNTER — Telehealth: Payer: Self-pay

## 2021-05-16 ENCOUNTER — Telehealth: Payer: Self-pay | Admitting: *Deleted

## 2021-05-16 NOTE — Telephone Encounter (Signed)
First attempt, left VM.  

## 2021-05-16 NOTE — Telephone Encounter (Signed)
Second attempt follow up call to pt, no answer.  

## 2021-05-20 ENCOUNTER — Ambulatory Visit
Admission: RE | Admit: 2021-05-20 | Discharge: 2021-05-20 | Disposition: A | Discharge status: Home / Self Care | Payer: Managed Care, Other (non HMO) | Source: Ambulatory Visit | Attending: Obstetrics and Gynecology | Admitting: Obstetrics and Gynecology

## 2021-05-20 DIAGNOSIS — Z1231 Encounter for screening mammogram for malignant neoplasm of breast: Secondary | ICD-10-CM

## 2021-05-27 ENCOUNTER — Encounter: Payer: Self-pay | Admitting: Gastroenterology

## 2021-08-06 ENCOUNTER — Ambulatory Visit: Payer: Managed Care, Other (non HMO) | Admitting: Gastroenterology

## 2021-10-13 ENCOUNTER — Telehealth: Payer: Self-pay | Admitting: Interventional Cardiology

## 2021-10-13 DIAGNOSIS — I209 Angina pectoris, unspecified: Secondary | ICD-10-CM

## 2021-10-13 MED ORDER — NITROGLYCERIN 0.4 MG SL SUBL: 0.4000 mg | SUBLINGUAL_TABLET | SUBLINGUAL | 0 refills | Status: DC | PRN

## 2021-10-13 NOTE — Telephone Encounter (Signed)
Pt c/o of Chest Pain: STAT if CP now or developed within 24 hours  1. Are you having CP right now? No  2. Are you experiencing any other symptoms (ex. SOB, nausea, vomiting, sweating)? Pain going up neck from left side of chest. Also having pain in back area.   3. How long have you been experiencing CP?  About a month   4. Is your CP continuous or coming and going? Coming and going   5. Have you taken Nitroglycerin? No ?

## 2021-10-13 NOTE — Telephone Encounter (Signed)
Pt called to report that she has been having a lot of added stress at home with some family issues... she has had some chest pain on her right sided radiating to her right jaw and some dizziness with rest and exertion... she has not had any pain today but if she develops any further pain or worsening symptoms she will go to the ED... if not she has an appt tomorrow with Dr Irish Lack. I sent in an update RX of her nitro which she says he has not had to take at this point. Laying down helps her to feel better. She will monitor and call if anything changes before her appt tomorrow. Marland Kitchen

## 2021-10-13 NOTE — Progress Notes (Unsigned)
Cardiology Office Note   Date:  10/13/2021   ID:  Stacy [name], DOB [birth year], MRN [id number]  PCP:  Charlane Ferretti, MD    No chief complaint on file.  Chest pain  Wt Readings from Last 3 Encounters:  05/14/21 220 lb (99.8 kg)  05/08/21 220 lb (99.8 kg)  03/07/21 234 lb (106.1 kg)       History of Present Illness: Stacy [name] is a 46 y.o. female   who I saw in June 2022.  Records show: "who saw Dr. Einar Gip.  She requested transfer to [facility].  Prior records from 05/2020 show:   "46 y.o. Caucasian female patient with fibromyalgia, migraine headaches, nonobstructive coronary atherosclerosis by coronary CTA on 04/09/2020 with very mild soft plaque in the distal LAD, hypothyroidism, hyperlipidemia who had seen my partner Dr. Terri Skains on 04/17/2020 for evaluation of chest pain. She was recommended aggressive risk factor modification.  She now presents for a 70-monthoffice visit.   She is accompanied by her husband.  States that she is very concerned about stroke and CV events.  About 2 weeks ago when she woke up she had severe tingling and numbness in her whole left side of the body that lasted for 2-3 minutes associated with tremors.  This resolved but she has been concerned about ongoing tingling and numbness that occurs randomly on her feet and hands.  She also endorses migraine headaches that get severe especially during summertime without aura.   She has no chest pain, but does admit to sedentary lifestyle. She denies leg edema, PND or orthopnea, symptoms of claudication."   Cardiac tests from Dr. GEinar Gip "Echocardiogram: 04/11/2020: Left ventricle cavity is normal in size and wall thickness. Normal LV systolic function with EF 74%. Normal global wall motion. Normal diastolic filling pattern. Mild (Grade I) mitral regurgitation. Mild tricuspid regurgitation. No evidence of pulmonary hypertension.   Stress Testing: No results found for this or any previous visit from the  past 1095 days.   Heart Catheterization: None   Coronary CTA 04/09/2020 1. Coronary calcium score of 0. 2. Normal coronary origin with left dominance. 3. CAD-RADS = 3. Moderate stenosis (50-69%) in distal LAD due to an eccentric noncalcified plaque. Overall accuracy is limited due to artifact involving apical LAD and LCx distribution. 4. Study is sent for CT-FFR to further evaluate the distal LAD. CT FFR analysis showed no significant stenosis. 5. Hepatic steatosis."   On 6/24, she had facial tingling, numbness.  She called her neuro MD and she was instructed to go to ER.  She had her MRI and it was negative.  Sx have improved.     She reports daily tingling.  She feels that something is wrong.  SHe has some issues with shaking.  She has chronic shortness of breath but does not report any exertional chest discomfort."  She called the office yesterday: "Pt called to report that she has been having a lot of added stress at home with some family issues... she has had some chest pain on her right sided radiating to her right jaw and some dizziness with rest and exertion... she has not had any pain today but if she develops any further pain or worsening symptoms she will go to the ED... if not she has an appt tomorrow with Dr VIrish Lack I sent in an update RX of her nitro which she says he has not had to take at this point. Laying down helps her to feel better.  She will monitor and call if anything changes before her appt tomorrow."  Past Medical History:  Diagnosis Date   Anxiety    Depressive disorder    Fibromyalgia    Hypertriglyceridemia    Hypoglycemia    Hypokalemia    Hypothyroidism    Migraine    Nonobstructive atherosclerosis of coronary artery    Numbness and tingling    hands   RLS (restless legs syndrome)    Seizures ([place])    x 1 in school   Tremor    hands    Past Surgical History:  Procedure Laterality Date   ABDOMINAL HYSTERECTOMY  2005   has her right ovary left she  thinks   BREAST BIOPSY Left 09/27/2014   [facility]     Current Outpatient Medications  Medication Sig Dispense Refill   aspirin EC 81 MG tablet Take 1 tablet (81 mg total) by mouth daily. Swallow whole. 90 tablet 3   busPIRone (BUSPAR) 30 MG tablet Take 30 mg by mouth 2 (two) times daily.     DULoxetine (CYMBALTA) 60 MG capsule Take 60 mg by mouth at bedtime.     gabapentin (NEURONTIN) 300 MG capsule Take 300 mg by mouth 2 (two) times daily.     levothyroxine (SYNTHROID) 75 MCG tablet Take 75 mcg by mouth daily before breakfast.     nitroGLYCERIN (NITROSTAT) 0.4 MG SL tablet Place 1 tablet (0.4 mg total) under the tongue every 5 (five) minutes as needed for chest pain. If you require more than two tablets five minutes apart go to the nearest ER via EMS. 25 tablet 0   Plecanatide (TRULANCE) 3 MG TABS Take 1 tablet by mouth daily. 90 tablet 3   pramipexole (MIRAPEX) 1 MG tablet Take 1 mg by mouth at bedtime.     rosuvastatin (CRESTOR) 20 MG tablet Take 1/2 tablet (10 mg) by mouth once a week 12 tablet 3   torsemide (DEMADEX) 10 MG tablet TAKE 1 TABLET BY MOUTH ONCE DAILY AS NEEDED FOR  SWELLING  **STOP  FUROSEMIDE** 90 tablet 1   No current facility-administered medications for this visit.    Allergies:   Patient has no known allergies.    Social History:  The patient  reports that she has never smoked. She has never used smokeless tobacco. She reports current alcohol use. She reports that she does not use drugs.   Family History:  The patient's ***family history includes Bladder Cancer in her father; Colon cancer in her maternal uncle; Colon polyps in her father, maternal grandmother, mother, and paternal grandfather; Diabetes in her mother; Berenice Primas' disease in her sister; Heart attack in her paternal grandfather; Heart disease in her father; Hyperlipidemia in her father and mother; Hypertension in her father and mother; Hypothyroidism in her father; Stroke in her father; Thyroid disease in her  mother.    ROS:  Please see the history of present illness.   Otherwise, review of systems are positive for ***.   All other systems are reviewed and negative.    PHYSICAL EXAM: VS:  There were no vitals taken for this visit. , BMI There is no height or weight on file to calculate BMI. GEN: Well nourished, well developed, in no acute distress HEENT: normal Neck: no JVD, carotid bruits, or masses Cardiac: ***RRR; no murmurs, rubs, or gallops,no edema  Respiratory:  clear to auscultation bilaterally, normal work of breathing GI: soft, nontender, nondistended, + BS MS: no deformity or atrophy Skin: warm and dry, no rash  Neuro:  Strength and sensation are intact Psych: euthymic mood, full affect   EKG:   The ekg ordered today demonstrates ***   Recent Labs: 03/19/2021: ALT 20; BUN 11; Creatinine, Ser 0.91; Hemoglobin 12.7; Platelets 350.0; Potassium 3.6; Sodium 137   Lipid Panel    Component Value Date/Time   CHOL 165 04/04/2020 1304   TRIG 130 04/04/2020 1304   HDL 59 04/04/2020 1304   CHOLHDL 3 06/17/2011 1026   VLDL 21.0 06/17/2011 1026   LDLCALC 83 04/04/2020 1304   LDLDIRECT 80 04/04/2020 1304     Other studies Reviewed: Additional studies/ records that were reviewed today with results demonstrating: ***.   ASSESSMENT AND PLAN:  Chest pain: Calcium score of 0 in the past.  Question of distal LAD narrowing on coronary CTA. TIA: She has seen Dr. Carlyon Shadow and neurology in the past.  She had persistent numbness and tingling dating back to 2022. Hyperlipidemia: Shortness of breath: She has used as needed Lasix in the past.   Current medicines are reviewed at length with the patient today.  The patient concerns regarding her medicines were addressed.  The following changes have been made:  No change***  Labs/ tests ordered today include: *** No orders of the defined types were placed in this encounter.   Recommend 150 minutes/week of aerobic exercise Low fat, low  carb, high fiber diet recommended  Disposition:   FU in ***   Signed, Larae Grooms, MD  10/13/2021 10:47 AM    [facility] [place], [place], [place]  [address] Phone: [phone]; Fax: [phone]

## 2021-10-14 ENCOUNTER — Ambulatory Visit: Payer: Managed Care, Other (non HMO) | Attending: Interventional Cardiology | Admitting: Interventional Cardiology

## 2021-10-14 VITALS — BP 146/96 | HR 70 | Ht 65.5 in | Wt 216.0 lb

## 2021-10-14 DIAGNOSIS — R030 Elevated blood-pressure reading, without diagnosis of hypertension: Secondary | ICD-10-CM

## 2021-10-14 DIAGNOSIS — R072 Precordial pain: Secondary | ICD-10-CM | POA: Diagnosis not present

## 2021-10-14 DIAGNOSIS — E782 Mixed hyperlipidemia: Secondary | ICD-10-CM

## 2021-10-14 NOTE — Patient Instructions (Addendum)
Medication Instructions:  Your physician recommends that you continue on your current medications as directed. Please refer to the Current Medication list given to you today.  *If you need a refill on your cardiac medications before your next appointment, please call your pharmacy*   Lab Work: Lab work to be done today--CBC and CMET If you have labs (blood work) drawn today and your tests are completely normal, you will receive your results only by: [place] (if you have MyChart) OR A paper copy in the mail If you have any lab test that is abnormal or we need to change your treatment, we will call you to review the results.   Testing/Procedures: none   Follow-Up: At [facility], you and your health needs are our priority.  As part of our continuing mission to provide you with exceptional heart care, we have created designated Provider Care Teams.  These Care Teams include your primary Cardiologist (physician) and Advanced Practice Providers (APPs -  Physician Assistants and Nurse Practitioners) who all work together to provide you with the care you need, when you need it.  We recommend signing up for the patient portal called "MyChart".  Sign up information is provided on this After Visit Summary.  MyChart is used to connect with patients for Virtual Visits (Telemedicine).  Patients are able to view lab/test results, encounter notes, upcoming appointments, etc.  Non-urgent messages can be sent to your provider as well.   To learn more about what you can do with MyChart, go to [email or web address].    Your next appointment:   12 month(s)  The format for your next appointment:   In Person  Provider:   Larae Grooms, MD     Other Instructions  Your provider recommends that you maintain 150 minutes per week of moderate aerobic activity.  High-Fiber Eating Plan Fiber, also called dietary fiber, is a type of carbohydrate. It is found foods such as fruits,  vegetables, whole grains, and beans. A high-fiber diet can have many health benefits. Your health care provider may recommend a high-fiber diet to help: Prevent constipation. Fiber can make your bowel movements more regular. Lower your cholesterol. Relieve the following conditions: Inflammation of veins in the anus (hemorrhoids). Inflammation of specific areas of the digestive tract (uncomplicated diverticulosis). A problem of the large intestine, also called the colon, that sometimes causes pain and diarrhea (irritable bowel syndrome, or IBS). Prevent overeating as part of a weight-loss plan. Prevent heart disease, type 2 diabetes, and certain cancers. What are tips for following this plan? Reading food labels  Check the nutrition facts label on food products for the amount of dietary fiber. Choose foods that have 5 grams of fiber or more per serving. The goals for recommended daily fiber intake include: Men (age 29 or younger): 34-38 g. Men (over age 33): 28-34 g. Women (age 28 or younger): 25-28 g. Women (over age 46): 22-25 g. Your daily fiber goal is _____________ g. Shopping Choose whole fruits and vegetables instead of processed forms, such as apple juice or applesauce. Choose a wide variety of high-fiber foods such as avocados, lentils, oats, and kidney beans. Read the nutrition facts label of the foods you choose. Be aware of foods with added fiber. These foods often have high sugar and sodium amounts per serving. Cooking Use whole-grain flour for baking and cooking. Cook with brown rice instead of white rice. Meal planning Start the day with a breakfast that is high in fiber, such as a  cereal that contains 5 g of fiber or more per serving. Eat breads and cereals that are made with whole-grain flour instead of refined flour or white flour. Eat brown rice, bulgur wheat, or millet instead of white rice. Use beans in place of meat in soups, salads, and pasta dishes. Be sure that  half of the grains you eat each day are whole grains. General information You can get the recommended daily intake of dietary fiber by: Eating a variety of fruits, vegetables, grains, nuts, and beans. Taking a fiber supplement if you are not able to take in enough fiber in your diet. It is better to get fiber through food than from a supplement. Gradually increase how much fiber you consume. If you increase your intake of dietary fiber too quickly, you may have bloating, cramping, or gas. Drink plenty of water to help you digest fiber. Choose high-fiber snacks, such as berries, raw vegetables, nuts, and popcorn. What foods should I eat? Fruits Berries. Pears. Apples. Oranges. Avocado. Prunes and raisins. Dried figs. Vegetables Sweet potatoes. Spinach. Kale. Artichokes. Cabbage. Broccoli. Cauliflower. Green peas. Carrots. Squash. Grains Whole-grain breads. Multigrain cereal. Oats and oatmeal. Brown rice. Barley. Bulgur wheat. [place]. Quinoa. Bran muffins. Popcorn. Rye wafer crackers. Meats and other proteins Navy beans, kidney beans, and pinto beans. Soybeans. Split peas. Lentils. Nuts and seeds. Dairy Fiber-fortified yogurt. Beverages Fiber-fortified soy milk. Fiber-fortified orange juice. Other foods Fiber bars. The items listed above may not be a complete list of recommended foods and beverages. Contact a dietitian for more information. What foods should I avoid? Fruits Fruit juice. Cooked, strained fruit. Vegetables Fried potatoes. Canned vegetables. Well-cooked vegetables. Grains White bread. Pasta made with refined flour. White rice. Meats and other proteins Fatty cuts of meat. Fried chicken or fried fish. Dairy Milk. Yogurt. Cream cheese. Sour cream. Fats and oils Butters. Beverages Soft drinks. Other foods Cakes and pastries. The items listed above may not be a complete list of foods and beverages to avoid. Talk with your dietitian about what choices are best for  you. Summary Fiber is a type of carbohydrate. It is found in foods such as fruits, vegetables, whole grains, and beans. A high-fiber diet has many benefits. It can help to prevent constipation, lower blood cholesterol, aid weight loss, and reduce your risk of heart disease, diabetes, and certain cancers. Increase your intake of fiber gradually. Increasing fiber too quickly may cause cramping, bloating, and gas. Drink plenty of water while you increase the amount of fiber you consume. The best sources of fiber include whole fruits and vegetables, whole grains, nuts, seeds, and beans. This information is not intended to replace advice given to you by your health care provider. Make sure you discuss any questions you have with your health care provider. Document Revised: 04/27/2019 Document Reviewed: 04/27/2019 Elsevier Patient Education  [place].  Low-Sodium Eating Plan Sodium, which is an element that makes up salt, helps you maintain a healthy balance of fluids in your body. Too much sodium can increase your blood pressure and cause fluid and waste to be held in your body. Your health care provider or dietitian may recommend following this plan if you have high blood pressure (hypertension), kidney disease, liver disease, or heart failure. Eating less sodium can help lower your blood pressure, reduce swelling, and protect your heart, liver, and kidneys. What are tips for following this plan? Reading food labels The Nutrition Facts label lists the amount of sodium in one serving of the food.  If you eat more than one serving, you must multiply the listed amount of sodium by the number of servings. Choose foods with less than 140 mg of sodium per serving. Avoid foods with 300 mg of sodium or more per serving. Shopping  Look for lower-sodium products, often labeled as "low-sodium" or "no salt added." Always check the sodium content, even if foods are labeled as "unsalted" or "no salt  added." Buy fresh foods. Avoid canned foods and pre-made or frozen meals. Avoid canned, cured, or processed meats. Buy breads that have less than 80 mg of sodium per slice. Cooking  Eat more home-cooked food and less restaurant, buffet, and fast food. Avoid adding salt when cooking. Use salt-free seasonings or herbs instead of table salt or sea salt. Check with your health care provider or pharmacist before using salt substitutes. Cook with plant-based oils, such as canola, sunflower, or olive oil. Meal planning When eating at a restaurant, ask that your food be prepared with less salt or no salt, if possible. Avoid dishes labeled as brined, pickled, cured, smoked, or made with soy sauce, miso, or teriyaki sauce. Avoid foods that contain MSG (monosodium glutamate). MSG is sometimes added to [place] food, bouillon, and some canned foods. Make meals that can be grilled, baked, poached, roasted, or steamed. These are generally made with less sodium. General information Most people on this plan should limit their sodium intake to 1,500-2,000 mg (milligrams) of sodium each day. What foods should I eat? Fruits Fresh, frozen, or canned fruit. Fruit juice. Vegetables Fresh or frozen vegetables. "No salt added" canned vegetables. "No salt added" tomato sauce and paste. Low-sodium or reduced-sodium tomato and vegetable juice. Grains Low-sodium cereals, including oats, puffed wheat and rice, and shredded wheat. Low-sodium crackers. Unsalted rice. Unsalted pasta. Low-sodium bread. Whole-grain breads and whole-grain pasta. Meats and other proteins Fresh or frozen (no salt added) meat, poultry, seafood, and fish. Low-sodium canned tuna and salmon. Unsalted nuts. Dried peas, beans, and lentils without added salt. Unsalted canned beans. Eggs. Unsalted nut butters. Dairy Milk. Soy milk. Cheese that is naturally low in sodium, such as ricotta cheese, fresh mozzarella, or Swiss cheese. Low-sodium or  reduced-sodium cheese. Cream cheese. Yogurt. Seasonings and condiments Fresh and dried herbs and spices. Salt-free seasonings. Low-sodium mustard and ketchup. Sodium-free salad dressing. Sodium-free light mayonnaise. Fresh or refrigerated horseradish. Lemon juice. Vinegar. Other foods Homemade, reduced-sodium, or low-sodium soups. Unsalted popcorn and pretzels. Low-salt or salt-free chips. The items listed above may not be a complete list of foods and beverages you can eat. Contact a dietitian for more information. What foods should I avoid? Vegetables Sauerkraut, pickled vegetables, and relishes. Olives. [place] fries. Onion rings. Regular canned vegetables (not low-sodium or reduced-sodium). Regular canned tomato sauce and paste (not low-sodium or reduced-sodium). Regular tomato and vegetable juice (not low-sodium or reduced-sodium). Frozen vegetables in sauces. Grains Instant hot cereals. Bread stuffing, pancake, and biscuit mixes. Croutons. Seasoned rice or pasta mixes. Noodle soup cups. Boxed or frozen macaroni and cheese. Regular salted crackers. Self-rising flour. Meats and other proteins Meat or fish that is salted, canned, smoked, spiced, or pickled. Precooked or cured meat, such as sausages or meat loaves. Bernie[place] Salines. Ham. Pepperoni. Hot dogs. Corned beef. Chipped beef. Salt pork. Jerky. Pickled herring. Anchovies and sardines. Regular canned tuna. Salted nuts. Dairy Processed cheese and cheese spreads. Hard cheeses. Cheese curds. Blue cheese. Feta cheese. String cheese. Regular cottage cheese. Buttermilk. Canned milk. Fats and oils Salted butter. Regular margarine. Ghee. Bacon fat. Seasonings and  condiments Onion salt, garlic salt, seasoned salt, table salt, and sea salt. Canned and packaged gravies. Worcestershire sauce. Tartar sauce. Barbecue sauce. Teriyaki sauce. Soy sauce, including reduced-sodium. Steak sauce. Fish sauce. Oyster sauce. Cocktail sauce. Horseradish that you find on the  shelf. Regular ketchup and mustard. Meat flavorings and tenderizers. Bouillon cubes. Hot sauce. Pre-made or packaged marinades. Pre-made or packaged taco seasonings. Relishes. Regular salad dressings. Salsa. Other foods Salted popcorn and pretzels. Corn chips and puffs. Potato and tortilla chips. Canned or dried soups. Pizza. Frozen entrees and pot pies. The items listed above may not be a complete list of foods and beverages you should avoid. Contact a dietitian for more information. Summary Eating less sodium can help lower your blood pressure, reduce swelling, and protect your heart, liver, and kidneys. Most people on this plan should limit their sodium intake to 1,500-2,000 mg (milligrams) of sodium each day. Canned, boxed, and frozen foods are high in sodium. Restaurant foods, fast foods, and pizza are also very high in sodium. You also get sodium by adding salt to food. Try to cook at home, eat more fresh fruits and vegetables, and eat less fast food and canned, processed, or prepared foods. This information is not intended to replace advice given to you by your health care provider. Make sure you discuss any questions you have with your health care provider. Document Revised: 01/27/2019 Document Reviewed: 11/23/2018 Elsevier Patient Education  [place].   How to Take Your Blood Pressure Blood pressure measures how strongly your blood is pressing against the walls of your arteries. Arteries are blood vessels that carry blood from your heart throughout your body. You can take your blood pressure at home with a machine. You may need to check your blood pressure at home: To check if you have high blood pressure (hypertension). To check your blood pressure over time. To make sure your blood pressure medicine is working. Supplies needed: Blood pressure machine, or monitor. A chair to sit in. This should be a chair where you can sit upright with your back supported. Do not sit on a  soft couch or an armchair. Table or desk. Small notebook. Pencil or pen. How to prepare Avoid these things for 30 minutes before checking your blood pressure: Having drinks with caffeine in them, such as coffee or tea. Drinking alcohol. Eating. Smoking. Exercising. Do these things five minutes before checking your blood pressure: Go to the bathroom and pee (urinate). Sit in a chair. Be quiet. Do not talk. How to take your blood pressure Follow the instructions that came with your machine. If you have a digital blood pressure monitor, these may be the instructions: Sit up straight. Place your feet on the floor. Do not cross your ankles or legs. Rest your left arm at the level of your heart. You may rest it on a table, desk, or chair. Pull up your shirt sleeve. Wrap the blood pressure cuff around the upper part of your left arm. The cuff should be 1 inch (2.5 cm) above your elbow. It is best to wrap the cuff around bare skin. Fit the cuff snugly around your arm, but not too tightly. You should be able to place only one finger between the cuff and your arm. Place the cord so that it rests in the bend of your elbow. Press the power button. Sit quietly while the cuff fills with air and loses air. Write down the numbers on the screen. Wait 2-3 minutes and then repeat  steps 1-10. What do the numbers mean? Two numbers make up your blood pressure. The first number is called systolic pressure. The second is called diastolic pressure. An example of a blood pressure reading is "120 over 80" (or 120/80). If you are an adult and do not have a medical condition, use this guide to find out if your blood pressure is normal: Normal First number: below 120. Second number: below 80. Elevated First number: 120-129. Second number: below 80. Hypertension stage 1 First number: 130-139. Second number: 80-89. Hypertension stage 2 First number: 140 or above. Second number: 55 or above. Your blood  pressure is above normal even if only the first or only the second number is above normal. Follow these instructions at home: Medicines Take over-the-counter and prescription medicines only as told by your doctor. Tell your doctor if your medicine is causing side effects. General instructions Check your blood pressure as often as your doctor tells you to. Check your blood pressure at the same time every day. Take your monitor to your next doctor's appointment. Your doctor will: Make sure you are using it correctly. Make sure it is working right. Understand what your blood pressure numbers should be. Keep all follow-up visits. General tips You will need a blood pressure machine or monitor. Your doctor can suggest a monitor. You can buy one at a drugstore or online. When choosing one: Choose one with an arm cuff. Choose one that wraps around your upper arm. Only one finger should fit between your arm and the cuff. Do not choose one that measures your blood pressure from your wrist or finger. Where to find more information American Heart Association: www.heart.org Contact a doctor if: Your blood pressure keeps being high. Your blood pressure is suddenly low. Get help right away if: Your first blood pressure number is higher than 180. Your second blood pressure number is higher than 120. These symptoms may be an emergency. Do not wait to see if the symptoms will go away. Get help right away. Call 911. Summary Check your blood pressure at the same time every day. Avoid caffeine, alcohol, smoking, and exercise for 30 minutes before checking your blood pressure. Make sure you understand what your blood pressure numbers should be. This information is not intended to replace advice given to you by your health care provider. Make sure you discuss any questions you have with your health care provider. Document Revised: 09/05/2020 Document Reviewed: 09/05/2020 Elsevier Patient Education  [place]

## 2021-10-15 LAB — COMPREHENSIVE METABOLIC PANEL
ALT: 15 IU/L (ref 0–32)
AST: 12 IU/L (ref 0–40)
Albumin/Globulin Ratio: 1.9 (ref 1.2–2.2)
Albumin: 4.5 g/dL (ref 3.9–4.9)
Alkaline Phosphatase: 104 IU/L (ref 44–121)
BUN/Creatinine Ratio: 15 (ref 9–23)
BUN: 12 mg/dL (ref 6–24)
Bilirubin Total: 0.2 mg/dL (ref 0.0–1.2)
CO2: 25 mmol/L (ref 20–29)
Calcium: 9.3 mg/dL (ref 8.7–10.2)
Chloride: 103 mmol/L (ref 96–106)
Creatinine, Ser: 0.8 mg/dL (ref 0.57–1.00)
Globulin, Total: 2.4 g/dL (ref 1.5–4.5)
Glucose: 107 mg/dL — ABNORMAL HIGH (ref 70–99)
Potassium: 4.3 mmol/L (ref 3.5–5.2)
Sodium: 140 mmol/L (ref 134–144)
Total Protein: 6.9 g/dL (ref 6.0–8.5)
eGFR: 91 mL/min/{1.73_m2} (ref >59)

## 2021-10-15 LAB — CBC
Hematocrit: 37.8 % (ref 34.0–46.6)
Hemoglobin: 12.7 g/dL (ref 11.1–15.9)
MCH: 30 pg (ref 26.6–33.0)
MCHC: 33.6 g/dL (ref 31.5–35.7)
MCV: 89 fL (ref 79–97)
Platelets: 328 10*3/uL (ref 150–450)
RBC: 4.24 x10E6/uL (ref 3.77–5.28)
RDW: 12 % (ref 11.7–15.4)
WBC: 6.4 10*3/uL (ref 3.4–10.8)

## 2021-12-17 ENCOUNTER — Inpatient Hospital Stay (HOSPITAL_COMMUNITY)
Admission: AD | Admit: 2021-12-17 | Discharge: 2021-12-18 | Disposition: A | Discharge status: Home / Self Care | Payer: Managed Care, Other (non HMO) | Attending: Obstetrics & Gynecology | Admitting: Obstetrics & Gynecology

## 2021-12-17 DIAGNOSIS — N390 Urinary tract infection, site not specified: Secondary | ICD-10-CM | POA: Diagnosis present

## 2021-12-17 DIAGNOSIS — N3001 Acute cystitis with hematuria: Secondary | ICD-10-CM | POA: Diagnosis not present

## 2021-12-17 LAB — URINALYSIS, ROUTINE W REFLEX MICROSCOPIC
Bacteria, UA: NONE SEEN
Bilirubin Urine: NEGATIVE
Glucose, UA: NEGATIVE mg/dL
Ketones, ur: NEGATIVE mg/dL
Nitrite: POSITIVE — AB
Protein, ur: 100 mg/dL — AB
Specific Gravity, Urine: 1.006 (ref 1.005–1.030)
WBC, UA: >50 WBC/hpf — ABNORMAL HIGH (ref 0–5)
pH: 5 (ref 5.0–8.0)

## 2021-12-17 LAB — POCT PREGNANCY, URINE: Preg Test, Ur: NEGATIVE

## 2021-12-17 NOTE — [facility] Note (Signed)
.  Stacy [name] is a 46 y.o. at [facility] here in [facility] reporting: pressure in her vaginal area, 2 days had some spotting with wiping, and then tonight while she was in the bathroom she started having constant pressure " like something is coming out", having some sharp cramping on her right side. Pt hx of hysterectomy, only one ovary.   Onset of complaint: 2 days Pain score: 7/10 Vitals:   12/17/21 2218  BP: (!) 147/91  Pulse: 90  Resp: 18  Temp: 98 F (36.7 C)  SpO2: 100%      Lab orders placed from triage:

## 2021-12-18 ENCOUNTER — Encounter (HOSPITAL_COMMUNITY): Payer: Self-pay | Admitting: Obstetrics & Gynecology

## 2021-12-18 ENCOUNTER — Inpatient Hospital Stay (HOSPITAL_COMMUNITY): Payer: Managed Care, Other (non HMO)

## 2021-12-18 DIAGNOSIS — N3001 Acute cystitis with hematuria: Secondary | ICD-10-CM

## 2021-12-18 LAB — WET PREP, GENITAL
Clue Cells Wet Prep HPF POC: NONE SEEN
Sperm: NONE SEEN
Trich, Wet Prep: NONE SEEN
WBC, Wet Prep HPF POC: <10 (ref <10)
Yeast Wet Prep HPF POC: NONE SEEN

## 2021-12-18 MED ORDER — CEFADROXIL 500 MG PO CAPS: 500.0000 mg | ORAL_CAPSULE | Freq: Two times a day (BID) | ORAL | 0 refills | Status: DC

## 2021-12-18 NOTE — MAU Provider Note (Signed)
History     CSN: [id number]  Arrival date and time: 12/17/21 2053   Event Date/Time   First Provider Initiated Contact with Patient 12/18/21 0101      Chief Complaint  Patient presents with   Vaginal Bleeding   HPI Stacy [name] is a 46 y.o. female who presents today for Vaginal Bleeding.  She states she has been having spotting, with wiping for the past 2 days.  She states she has noted blood in the toilet.  She states it is a pale pink in color.  She reports constant pressure that has no relieving factors, but is worse with urination.  She reports some right side abdominal cramping.    Pertinent Gynecological History: Menses:  S/P Hysterectomy 2006 Bleeding: Spotting with wiping x 2 Contraception: none DES exposure: unknown Blood transfusions: none Sexually transmitted diseases: no past history Previous GYN Procedures:  Hysterectomy     Past Medical History:  Diagnosis Date   Anxiety    Depressive disorder    Fibromyalgia    Hypertriglyceridemia    Hypoglycemia    Hypokalemia    Hypothyroidism    Migraine    Nonobstructive atherosclerosis of coronary artery    Numbness and tingling    hands   RLS (restless legs syndrome)    Seizures (HCC)    x 1 in school   Tremor    hands    Past Surgical History:  Procedure Laterality Date   ABDOMINAL HYSTERECTOMY  2005   has her right ovary left she thinks   BREAST BIOPSY Left 09/27/2014   PASH    Family History  Problem Relation Age of Onset   Colon polyps Mother    Diabetes Mother    Hyperlipidemia Mother    Hypertension Mother    Thyroid disease Mother    Colon polyps Father    Bladder Cancer Father        smoker   Stroke Father    Hyperlipidemia Father    Heart disease Father    Hypothyroidism Father    Hypertension Father    Graves' disease Sister    Colon cancer Maternal Uncle    Colon polyps Maternal Grandmother    Colon polyps Paternal Grandfather    Heart attack Paternal Grandfather     Breast cancer Neg Hx    Esophageal cancer Neg Hx    Rectal cancer Neg Hx    Stomach cancer Neg Hx     Social History   Tobacco Use   Smoking status: Never   Smokeless tobacco: Never  Vaping Use   Vaping Use: Never used  Substance Use Topics   Alcohol use: Yes    Comment: rarely   Drug use: Never    Allergies: No Known Allergies  Medications Prior to Admission  Medication Sig Dispense Refill Last Dose   ALPRAZolam (XANAX) 0.5 MG tablet Take 0.5 mg by mouth 3 (three) times daily as needed.   12/17/2021   aspirin EC 81 MG tablet Take 1 tablet (81 mg total) by mouth daily. Swallow whole. 90 tablet 3 12/17/2021   DULoxetine (CYMBALTA) 60 MG capsule Take 60 mg by mouth at bedtime.   12/17/2021   gabapentin (NEURONTIN) 300 MG capsule Take 300 mg by mouth 2 (two) times daily.   12/17/2021   levothyroxine (SYNTHROID) 75 MCG tablet Take 75 mcg by mouth daily before breakfast.   12/17/2021   pramipexole (MIRAPEX) 1 MG tablet Take 1 mg by mouth at bedtime.   12/17/2021  rosuvastatin (CRESTOR) 20 MG tablet Take 1/2 tablet (10 mg) by mouth once a week 12 tablet 3 Past Week   busPIRone (BUSPAR) 30 MG tablet Take 30 mg by mouth 2 (two) times daily.      nitroGLYCERIN (NITROSTAT) 0.4 MG SL tablet Place 1 tablet (0.4 mg total) under the tongue every 5 (five) minutes as needed for chest pain. If you require more than two tablets five minutes apart go to the nearest ER via EMS. 25 tablet 0 Unknown    Review of Systems  Gastrointestinal:  Positive for abdominal pain (Right side). Negative for constipation, diarrhea, nausea and vomiting.  Genitourinary:  Positive for vaginal bleeding (Spotting) and vaginal pain (Pressure). Negative for difficulty urinating, dysuria and vaginal discharge.  Musculoskeletal:  Negative for back pain.   Physical Exam   Blood pressure (!) 147/91, pulse 90, temperature 98 F (36.7 C), temperature source Oral, resp. rate 18, height '5\' 5"'$  (1.651 m), weight 99.4 kg,  SpO2 100 %.  Physical Exam Constitutional:      Appearance: Normal appearance.  HENT:     Head: Normocephalic and atraumatic.  Eyes:     Conjunctiva/sclera: Conjunctivae normal.  Cardiovascular:     Rate and Rhythm: Normal rate.  Pulmonary:     Effort: Pulmonary effort is normal. No respiratory distress.  Genitourinary:    Labia:        Right: No tenderness or lesion.        Left: No tenderness or lesion.      Vagina: Vaginal discharge present.     Comments: Pink mucosa with scant amt white discharge. Wet prep collected. No apparent cystocele or rectocele. Vaginal cuff intact.   Bimanual exam performed with no apparent masses noted. Right ovary not appreciated.  Musculoskeletal:        General: Normal range of motion.     Cervical back: Normal range of motion.  Skin:    General: Skin is warm and dry.  Neurological:     Mental Status: She is alert and oriented to person, place, and time.  Psychiatric:        Mood and Affect: Mood normal.        Behavior: Behavior normal.     MAU Course  Procedures Results for orders placed or performed during the hospital encounter of 12/17/21 (from the past 24 hour(s))  Pregnancy, urine POC     Status: None   Collection Time: 12/17/21  9:05 PM  Result Value Ref Range   Preg Test, Ur NEGATIVE NEGATIVE  Urinalysis, Routine w reflex microscopic     Status: Abnormal   Collection Time: 12/17/21  9:15 PM  Result Value Ref Range   Color, Urine YELLOW YELLOW   APPearance CLOUDY (A) CLEAR   Specific Gravity, Urine 1.006 1.005 - 1.030   pH 5.0 5.0 - 8.0   Glucose, UA NEGATIVE NEGATIVE mg/dL   Hgb urine dipstick LARGE (A) NEGATIVE   Bilirubin Urine NEGATIVE NEGATIVE   Ketones, ur NEGATIVE NEGATIVE mg/dL   Protein, ur 100 (A) NEGATIVE mg/dL   Nitrite POSITIVE (A) NEGATIVE   Leukocytes,Ua LARGE (A) NEGATIVE   RBC / HPF 6-10 0 - 5 RBC/hpf   WBC, UA >50 (H) 0 - 5 WBC/hpf   Bacteria, UA NONE SEEN NONE SEEN   Squamous Epithelial / LPF 0-5 0 -  5   WBC Clumps PRESENT    Mucus PRESENT     MDM Pelvic Exam Wet Prep Renal US Labs: UA, UC Prescription Assessment  and Plan  46 year old Female S/P Hysterectomy Abdominal Cramping Vaginal Pressure UTI  -POC Reviewed. -Exam performed and findings discussed. -Reassured that cuff intact and no apparent bleeding or irritation in vaginal vault. -Wet prep collected. -Informed that bleeding likely d/t UTI as UA with significant findings. -Culture sent. -Informed that Renal US recommended to r/o kidney stones considering flank pain and hematuria. -Patient agreeable. -Await [place] results.   Fraser Din Lawan Nanez 12/18/2021, 1:01 AM   Reassessment (2:03 AM) -Results as above. -Informed of normal findings. -Plan for treatment of suspected UTI. -Rx for [place] sent to pharmacy on file.  -Patient without questions. -Encouraged to call primary office or return to MAU if symptoms worsen or with the onset of new symptoms. -Discharged to home in stable condition.   Maryann Conners MSN, CNM Advanced Practice Provider, [facility]

## 2021-12-20 LAB — URINE CULTURE: Culture: >=100000 — AB

## 2022-04-07 ENCOUNTER — Telehealth: Payer: Self-pay | Admitting: Interventional Cardiology

## 2022-04-07 NOTE — Telephone Encounter (Signed)
Pt c/o of Chest Pain: STAT if CP now or developed within 24 hours  1. Are you having CP right now?  Chest pressure/tightness currently  2. Are you experiencing any other symptoms (ex. SOB, nausea, vomiting, sweating)?  Heartburn, left neck pain, headaches  3. How long have you been experiencing CP?  Been going on a while, has gotten worse over the past few weeks--patient mentions excess stress while going through a separation from her husband  4. Is your CP continuous or coming and going?  Coming and going   5. Have you taken Nitroglycerin?  Yes  ?

## 2022-04-07 NOTE — Telephone Encounter (Signed)
Spoke with patient who reports chest pressure with pain in left neck and indigestion. She denies N/V, palpitations and SOB. She has nitro and has taken it with some relief but reports having bad headaches after using.   She was seen by her PCP recently and prescribed new medications for indigestion and HTN. Her PCP advised her to follow up with cardiology.  She does admit to being under a lot of stress.   Advised her to start the medications for indigestion and HTN. Reviewed instructions for taking nitro. Advised to go to ED for worsening symptoms unrelieved by nitro or if she develops additional symptoms of SOB, Palpitations or N/V.  Patient verbalized understanding and had no questions.    Scheduled for appointment on 04/09/2022.

## 2022-04-09 ENCOUNTER — Ambulatory Visit: Payer: Managed Care, Other (non HMO) | Admitting: Interventional Cardiology

## 2022-04-27 ENCOUNTER — Other Ambulatory Visit: Payer: Self-pay | Admitting: Interventional Cardiology

## 2022-05-12 ENCOUNTER — Other Ambulatory Visit: Payer: Self-pay | Admitting: Obstetrics and Gynecology

## 2022-05-12 DIAGNOSIS — Z1231 Encounter for screening mammogram for malignant neoplasm of breast: Secondary | ICD-10-CM

## 2022-05-25 ENCOUNTER — Ambulatory Visit
Admission: RE | Admit: 2022-05-25 | Discharge: 2022-05-25 | Disposition: A | Discharge status: Home / Self Care | Payer: Managed Care, Other (non HMO) | Source: Ambulatory Visit | Attending: Obstetrics and Gynecology | Admitting: Obstetrics and Gynecology

## 2022-05-25 DIAGNOSIS — Z1231 Encounter for screening mammogram for malignant neoplasm of breast: Secondary | ICD-10-CM

## 2022-05-28 DIAGNOSIS — I2510 Atherosclerotic heart disease of native coronary artery without angina pectoris: Secondary | ICD-10-CM

## 2022-05-28 HISTORY — DX: Atherosclerotic heart disease of native coronary artery without angina pectoris: I25.10

## 2022-05-28 NOTE — Progress Notes (Addendum)
Cardiology Office Note:    Date:  05/29/2022  ID:  Stacy [name], DOB 1975-01-20, MRN [id number] PCP: Thana Ates, MD  [facility] Providers Cardiologist:  Lance Muss, MD          Patient Profile:   Nonobstructive coronary artery disease CCTA 04/09/2020: LAD distal 50-69 (non-calcific), RI, LCx, RCA patent; CAC score 0 >> FFR: No significant stenosis TTE 04/11/2020 [facility] CV): EF 74, no RWMA, mild MR, mild TR, normal PASP  Carotid US 05/16/2020 [facility] cardiovascular): Minimal R ICA stenosis LE arterial US 08/24/2020: Normal; ABIs right 1.01, left 1.07 History of TIA MRI 6/22: normal  Hypertension  Hyperlipidemia Hypothyroidism Fibromyalgia Hx of Seizure (age 65)      History of Present Illness:   Stacy [name] is a 47 y.o. female who returns for the evaluation of chest pain.  She was last seen by Dr. Eldridge Dace 10/14/2021. She is here alone. She notes L chest pain described as squeezing over the past 9 mos or so. She feels it is getting worse. It is not exertional. She notes assoc shortness of breath at times. She has had frequent palpitations. She has had sharp pain radiating to her neck she has not had syncope. But she has had lightheadedness/near syncope at times. She feels increased HR and lightheadedness with standing. She had a severe episode during urination in the middle of the night recently (?post-micturition) but no syncope. She has not had orthopnea. She notes dependent ankle edema. Her PGF has a hx of an MI. She does not smoke. She was started on Telmisartan a few mos ago for BP. BP at home is usually 120s.  Review of Systems  Constitutional: Negative for fever.  Respiratory:  Negative for cough.   Gastrointestinal:  Negative for hematochezia and melena.  Genitourinary:  Negative for hematuria.  Psychiatric/Behavioral:         +Stress; family issues       Studies Reviewed:    EKG:  NSR, HR 91, normal axis, QTc 450 ms, no STTW  changes   Risk Assessment/Calculations:             Physical Exam:   VS:  BP 124/84   Pulse 91   Ht 5' 5.5" (1.664 m)   Wt 223 lb 9.6 oz (101.4 kg)   SpO2 96%   BMI 36.64 kg/m    Wt Readings from Last 3 Encounters:  05/29/22 223 lb 9.6 oz (101.4 kg)  12/17/21 219 lb 1.6 oz (99.4 kg)  10/14/21 216 lb (98 kg)    Constitutional:      Appearance: Healthy appearance. Not in distress.  Neck:     Vascular: JVD normal.  Pulmonary:     Breath sounds: Normal breath sounds. No wheezing. No rales.  Cardiovascular:     Normal rate. Regular rhythm.     Murmurs: There is no murmur.     No rub.  Pulses:    Dorsalis pedis: 2+ bilaterally.    Posterior tibial: 2+ bilaterally. Edema:    Peripheral edema present.    Ankle: bilateral trace edema of the ankle. Abdominal:     Palpations: Abdomen is soft.       ASSESSMENT AND PLAN:   CAD (coronary artery disease) She notes symptoms of L sided chest pain with some features of cardiac pain. She also has some features of non-cardiac pain. Her CT did show mod non-calcific plaque in 2022. Her CAC score was 0. She has had a lot of  indigestion. This is s/w better with PPI. She notes increased HRs, especially with standing. She has had near syncope but no frank syncope. She has also noted shortness of breath at times. I suspect her symptoms are mainly due to stress. However, with mod stenosis on prior CT, I think she warrants further evaluation for ischemia. Question if her dizziness and elevated HRs/palpitations are evidence of an element of POTS (Postural Orthostatic Hypotension). Beta blocker Rx can help with this as well as an antianginal. It can also help with relaxation as a side effect which may be beneficial. Labs from PCP reviewed via [facility]. In 02/2022, Hgb was normal (13.1), TSH was normal (1.65). Arrange Myocardial PET to r/o ischemia Arrange Echocardiogram to r/o structural heart disease  Start Metoprolol succinate 25 mg once daily.  Continue  ASA 81 mg once daily, Crestor 10 mg weekly, NTG prn F/u 3 mos  Palpitations Start Metoprolol succinate 25 mg once daily. Obtain echocardiogram, 14 day Zio XT. F/u in 3 mos.   Mixed dyslipidemia Managed by PCP. Goal LDL < 55. Reviewed labs from PCP via [facility]. LDL was above goal at 96 in 02/2022. She is only on Crestor 10 mg once weekly. We can revisit this at f/u. Consider referral to the Lipid clinic.   Essential hypertension BP is controlled. If her BP runs too low with the addition of Metoprolol succinate, we can reduce Telmisartan to 20 mg once daily.     Informed Consent   Shared Decision Making/Informed Consent The risks [chest pain, shortness of breath, cardiac arrhythmias, dizziness, blood pressure fluctuations, myocardial infarction, stroke/transient ischemic attack, nausea, vomiting, allergic reaction, radiation exposure, metallic taste sensation and life-threatening complications (estimated to be 1 in 10,000)], benefits (risk stratification, diagnosing coronary artery disease, treatment guidance) and alternatives of a cardiac PET stress test were discussed in detail with Stacy [name] and she agrees to proceed.     Dispo:  Return in about 3 months (around 08/29/2022) for Follow up after testing w/ Dr. Eldridge Dace, or Tereso Newcomer, PA-C.  Signed, Tereso Newcomer, PA-C

## 2022-05-29 ENCOUNTER — Ambulatory Visit: Payer: Managed Care, Other (non HMO) | Attending: Physician Assistant

## 2022-05-29 ENCOUNTER — Ambulatory Visit: Payer: Managed Care, Other (non HMO) | Attending: Physician Assistant | Admitting: Physician Assistant

## 2022-05-29 ENCOUNTER — Encounter: Payer: Self-pay | Admitting: Physician Assistant

## 2022-05-29 VITALS — BP 124/84 | HR 91 | Ht 65.5 in | Wt 223.6 lb

## 2022-05-29 DIAGNOSIS — I25119 Atherosclerotic heart disease of native coronary artery with unspecified angina pectoris: Secondary | ICD-10-CM | POA: Diagnosis not present

## 2022-05-29 DIAGNOSIS — R55 Syncope and collapse: Secondary | ICD-10-CM

## 2022-05-29 DIAGNOSIS — R0602 Shortness of breath: Secondary | ICD-10-CM | POA: Diagnosis not present

## 2022-05-29 DIAGNOSIS — E782 Mixed hyperlipidemia: Secondary | ICD-10-CM

## 2022-05-29 DIAGNOSIS — R002 Palpitations: Secondary | ICD-10-CM

## 2022-05-29 DIAGNOSIS — R072 Precordial pain: Secondary | ICD-10-CM

## 2022-05-29 DIAGNOSIS — I2510 Atherosclerotic heart disease of native coronary artery without angina pectoris: Secondary | ICD-10-CM

## 2022-05-29 DIAGNOSIS — I10 Essential (primary) hypertension: Secondary | ICD-10-CM | POA: Insufficient documentation

## 2022-05-29 HISTORY — DX: Palpitations: R00.2

## 2022-05-29 MED ORDER — METOPROLOL SUCCINATE ER 25 MG PO TB24: 25.0000 mg | ORAL_TABLET | Freq: Every day | ORAL | 3 refills | Status: DC

## 2022-05-29 NOTE — Assessment & Plan Note (Signed)
Start Metoprolol succinate 25 mg once daily. Obtain echocardiogram, 14 day Zio XT. F/u in 3 mos.

## 2022-05-29 NOTE — Progress Notes (Unsigned)
Enrolled patient for a 14 day Zio XT monitor to be mailed to patients home   [place] to read

## 2022-05-29 NOTE — Assessment & Plan Note (Signed)
BP is controlled. If her BP runs too low with the addition of Metoprolol succinate, we can reduce Telmisartan to 20 mg once daily.

## 2022-05-29 NOTE — Assessment & Plan Note (Signed)
Managed by PCP. Goal LDL < 55. Reviewed labs from PCP via [facility]. LDL was above goal at 96 in 02/2022. She is only on Crestor 10 mg once weekly. We can revisit this at f/u. Consider referral to the Lipid clinic.

## 2022-05-29 NOTE — Patient Instructions (Signed)
Medication Instructions:  Your physician has recommended you make the following change in your medication:   Start Toprol XL 25 mg daily   HOW TO TAKE YOUR BLOOD PRESSURE  Rest 5 minutes before taking your blood pressure. Don't  smoke or drink caffeinated beverages for at least 30 minutes before. Take your blood pressure before (not after) you eat. Sit comfortably with your back supported and both feet on the floor ( don't cross your legs). Elevate your arm to heart level on a table or a desk. Use the proper sized cuff.  It should fit smoothly and snugly around your bare upper arm.  There should be  Enough room to slip a fingertip under the cuff.  The bottom edge of the cuff should be 1 inch above the crease Of the elbow. Please monitor your blood pressure once daily 2 hours after your am medication. If you blood pressure Consistently remains above 130 (systolic) top number or over 80 ( diastolic) bottom number X 3 days  Consecutively.  Please call our office at [phone] or send Mychart message.     ----Avoid cold medicines with D or DM at the end of them----    *If you need a refill on your cardiac medications before your next appointment, please call your pharmacy*   Testing/Procedures: ECHO Your physician has requested that you have an echocardiogram. Echocardiography is a painless test that uses sound waves to create images of your heart. It provides your doctor with information about the size and shape of your heart and how well your heart's chambers and valves are working. This procedure takes approximately one hour. There are no restrictions for this procedure. Please do NOT wear cologne, perfume, aftershave, or lotions (deodorant is allowed). Please arrive 15 minutes prior to your appointment time.  Heart monitor Your physician has recommended that you wear an event monitor. Event monitors are medical devices that record the heart's electrical activity. Doctors most often  [place] these monitors to diagnose arrhythmias. Arrhythmias are problems with the speed or rhythm of the heartbeat. The monitor is a small, portable device. You can wear one while you do your normal daily activities. This is usually used to diagnose what is causing palpitations/syncope (passing out).    Cardiac Pet scan  Follow-Up: At [facility], you and your health needs are our priority.  As part of our continuing mission to provide you with exceptional heart care, we have created designated Provider Care Teams.  These Care Teams include your primary Cardiologist (physician) and Advanced Practice Providers (APPs -  Physician Assistants and Nurse Practitioners) who all work together to provide you with the care you need, when you need it.   Your next appointment:   3 month(s)  Provider:   Lance Muss, MD  or Tereso Newcomer PA   Other Instructions Stacy [name]- Long Term Monitor Instructions  Your physician has requested you wear a ZIO patch monitor for 14 days.  This is a single patch monitor. [employer] supplies one patch monitor per enrollment. Additional stickers are not available. Please do not apply patch if you will be having a Nuclear Stress Test,  Echocardiogram, Cardiac CT, MRI, or Chest Xray during the period you would be wearing the  monitor. The patch cannot be worn during these tests. You cannot remove and re-apply the  ZIO XT patch monitor.  Your ZIO patch monitor will be mailed 3 day USPS to your address on file. It may take 3-5 days  to receive  your monitor after you have been enrolled.  Once you have received your monitor, please review the enclosed instructions. Your monitor  has already been registered assigning a specific monitor serial # to you.  Billing and Patient Assistance Program Information  We have supplied [employer] with any of your insurance information on file for billing purposes. [employer] offers a sliding scale Patient Assistance Program for patients  that do not have  insurance, or whose insurance does not completely cover the cost of the ZIO monitor.  You must apply for the Patient Assistance Program to qualify for this discounted rate.  To apply, please call [employer] at [phone], select option 4, select option 2, ask to apply for  Patient Assistance Program. Meredeth Ide will ask your household income, and how many people  are in your household. They will quote your out-of-pocket cost based on that information.  [employer] will also be able to set up a 11-month, interest-free payment plan if needed.  Applying the monitor   Shave hair from upper left chest.  Hold abrader disc by orange tab. Rub abrader in 40 strokes over the upper left chest as  indicated in your monitor instructions.  Clean area with 4 enclosed alcohol pads. Let dry.  Apply patch as indicated in monitor instructions. Patch will be placed under collarbone on left  side of chest with arrow pointing upward.  Rub patch adhesive wings for 2 minutes. Remove white label marked "1". Remove the white  label marked "2". Rub patch adhesive wings for 2 additional minutes.  While looking in a mirror, press and release button in center of patch. A small green light will  flash 3-4 times. This will be your only indicator that the monitor has been turned on.  Do not shower for the first 24 hours. You may shower after the first 24 hours.  Press the button if you feel a symptom. You will hear a small click. Record Date, Time and  Symptom in the Patient Logbook.  When you are ready to remove the patch, follow instructions on the last 2 pages of Patient  Logbook. Stick patch monitor onto the last page of Patient Logbook.  Place Patient Logbook in the blue and white box. Use locking tab on box and tape box closed  securely. The blue and white box has prepaid postage on it. Please place it in the mailbox as  soon as possible. Your physician should have your test results approximately 7 days  after the  monitor has been mailed back to [facility].  Call [facility] Customer Care at [phone] if you have questions regarding  your ZIO XT patch monitor. Call them immediately if you see an orange light blinking on your  monitor.  If your monitor falls off in less than 4 days, contact our Monitor department at [phone].  If your monitor becomes loose or falls off after 4 days call [employer] at [phone] for  suggestions on securing your monitor     How to Prepare for Your Cardiac PET/CT Stress Test:  1. Please do not take these medications before your test:   Medications that may interfere with the cardiac pharmacological stress agent (ex. nitrates - including erectile dysfunction medications, isosorbide mononitrate, tamulosin or beta-blockers) the day of the exam. (Erectile dysfunction medication should be held for at least 72 hrs prior to test) Theophylline containing medications for 12 hours. Dipyridamole 48 hours prior to the test. Your remaining medications may be taken with water.  2. Nothing to eat  or drink, except water, 3 hours prior to arrival time.   NO caffeine/decaffeinated products, or chocolate 12 hours prior to arrival.  3. NO perfume, cologne or lotion  4. Total time is 1 to 2 hours; you may want to bring reading material for the waiting time.  5. Please report to Radiology at the [facility] Main Entrance 30 minutes early for your test.  [address] [place], [place] [address]  Diabetic Preparation:  Hold oral medications. You may take NPH and Lantus insulin. Do not take Humalog or Humulin R (Regular Insulin) the day of your test. Check blood sugars prior to leaving the house. If able to eat breakfast prior to 3 hour fasting, you may take all medications, including your insulin, Do not worry if you miss your breakfast dose of insulin - start at your next meal.  IF YOU THINK YOU MAY BE PREGNANT, OR ARE NURSING PLEASE INFORM  THE TECHNOLOGIST.  In preparation for your appointment, medication and supplies will be purchased.  Appointment availability is limited, so if you need to cancel or reschedule, please call the Radiology Department at [phone]  24 hours in advance to avoid a cancellation fee of $100.00  What to Expect After you Arrive:  Once you arrive and check in for your appointment, you will be taken to a preparation room within the Radiology Department.  A technologist or Nurse will obtain your medical history, verify that you are correctly prepped for the exam, and explain the procedure.  Afterwards,  an IV will be started in your arm and electrodes will be placed on your skin for EKG monitoring during the stress portion of the exam. Then you will be escorted to the PET/CT scanner.  There, staff will get you positioned on the scanner and obtain a blood pressure and EKG.  During the exam, you will continue to be connected to the EKG and blood pressure machines.  A small, safe amount of a radioactive tracer will be injected in your IV to obtain a series of pictures of your heart along with an injection of a stress agent.    After your Exam:  It is recommended that you eat a meal and drink a caffeinated beverage to counter act any effects of the stress agent.  Drink plenty of fluids for the remainder of the day and urinate frequently for the first couple of hours after the exam.  Your doctor will inform you of your test results within 7-10 business days.  For questions about your test or how to prepare for your test, please call: Rockwell Alexandria, Cardiac Imaging Nurse Navigator  Larey Brick, Cardiac Imaging Nurse Navigator Office: [phone]

## 2022-05-29 NOTE — Assessment & Plan Note (Addendum)
She notes symptoms of L sided chest pain with some features of cardiac pain. She also has some features of non-cardiac pain. Her CT did show mod non-calcific plaque in 2022. Her CAC score was 0. She has had a lot of indigestion. This is s/w better with PPI. She notes increased HRs, especially with standing. She has had near syncope but no frank syncope. She has also noted shortness of breath at times. I suspect her symptoms are mainly due to stress. However, with mod stenosis on prior CT, I think she warrants further evaluation for ischemia. Question if her dizziness and elevated HRs/palpitations are evidence of an element of POTS (Postural Orthostatic Hypotension). Beta blocker Rx can help with this as well as an antianginal. It can also help with relaxation as a side effect which may be beneficial. Labs from PCP reviewed via [facility]. In 02/2022, Hgb was normal (13.1), TSH was normal (1.65). Arrange Myocardial PET to r/o ischemia Arrange Echocardiogram to r/o structural heart disease  Start Metoprolol succinate 25 mg once daily.  Continue ASA 81 mg once daily, Crestor 10 mg weekly, NTG prn F/u 3 mos

## 2022-06-12 ENCOUNTER — Encounter: Payer: Self-pay | Admitting: Physician Assistant

## 2022-06-12 ENCOUNTER — Encounter: Payer: Self-pay | Admitting: *Deleted

## 2022-06-12 ENCOUNTER — Telehealth: Payer: Self-pay | Admitting: *Deleted

## 2022-06-12 ENCOUNTER — Ambulatory Visit (HOSPITAL_COMMUNITY): Payer: Managed Care, Other (non HMO) | Attending: Internal Medicine

## 2022-06-12 DIAGNOSIS — R55 Syncope and collapse: Secondary | ICD-10-CM

## 2022-06-12 DIAGNOSIS — I25119 Atherosclerotic heart disease of native coronary artery with unspecified angina pectoris: Secondary | ICD-10-CM

## 2022-06-12 DIAGNOSIS — R0602 Shortness of breath: Secondary | ICD-10-CM

## 2022-06-12 DIAGNOSIS — R072 Precordial pain: Secondary | ICD-10-CM

## 2022-06-12 DIAGNOSIS — R079 Chest pain, unspecified: Secondary | ICD-10-CM

## 2022-06-12 LAB — ECHOCARDIOGRAM COMPLETE
Area-P 1/2: 3.1 cm2
Est EF: >75
S' Lateral: 2.3 cm

## 2022-06-12 NOTE — Telephone Encounter (Signed)
Call placed to pt.  She has been made aware that her insurance wouldn't cover Cardiac NM Pet scan and we want her to have a [employer].  Order has been placed and instructions have been verbally given to pt and also sent via mychart.   You are scheduled for a Myocardial Perfusion Imaging Study.  Please arrive 15 minutes prior to your appointment time for registration and insurance purposes.  The test will take approximately 3 to 4 hours to complete; you may bring reading material.  If someone comes with you to your appointment, they will need to remain in the main lobby due to limited space in the testing area. **If you are pregnant or breastfeeding, please notify the nuclear lab prior to your appointment**  How to prepare for your Myocardial Perfusion Test: Do not eat or drink 3 hours prior to your test, except you may have water. Do not consume products containing caffeine (regular or decaffeinated) 12 hours prior to your test. (ex: coffee, chocolate, sodas, tea). Do bring a list of your current medications with you.  If not listed below, you may take your medications as normal. Do wear comfortable clothes (no dresses or overalls) and walking shoes, tennis shoes preferred (No heels or open toe shoes are allowed). Do NOT wear cologne, perfume, aftershave, or lotions (deodorant is allowed). If these instructions are not followed, your test will have to be rescheduled.

## 2022-06-12 NOTE — Telephone Encounter (Signed)
-----   Message from Beatrice Lecher, [place] sent at 06/12/2022 12:59 PM EDT ----- Regarding: RE: AUTH DENIAL Please change her to a [employer]. Tereso Newcomer, PA-C    06/12/2022 12:59 PM   ----- Message ----- From: Francine Graven Sent: 06/11/2022  11:23 AM EDT To: Sande Rives, MD; Ellin Saba; # Subject: Ollen Bowl                                    Good morning,   Rosann Auerbach has denied Pt's Pet scan auth. Sending this to everyone in today's meeting to show an example of barriers needed to approve. Not sure if you want to inform pt yet to reschedule or maybe we can find a resolve before 6/18 appt. I did not see what they want specifically in clinicals, so if there are results I can send with an appeal, I will do that. Following are details of denial.  We received your request to cover the following service(s): [phone] Myocardial (heart) imaging, positron emission tomography with computed tomography transmission (PET/CT) perfusion study, is a type of picture study that looks at blood supply to the heart under stress conditions (similar to exercise) or at rest. After reviewing the information we have, we've determined that this service is not covered. This letter explains why. It describes your right to ask for another review. It also describes the steps you or your health care professional can take to make that request.  Your healthcare provider told us that there is a concern related to the blood vessels in your heart. The request cannot be approved because:  Notes from your doctor must show one of the following applies to you. -Severe obesity. -Large breasts or implants. -Not able to exercise enough to reach a target heart rate due to a physical problem.   This finding was based on review of [facility] Cardiac Imaging Guidelines Section(s): Cardiac PET - Perfusion - Indications (CD 6.2).   If you are the requesting physician, and you would like to request a peer-to-peer  conversation (available for medical necessity denials only), with an [facility] physician,your health care professional can call our Health Services Department at [phone] option 4 to discuss this decision with a physician reviewer. Using Reference Code - [id number].  Thanks, [employer]

## 2022-06-17 NOTE — Telephone Encounter (Signed)
Done. Tereso Newcomer, PA-C    06/17/2022 10:24 AM

## 2022-06-23 ENCOUNTER — Encounter (HOSPITAL_COMMUNITY): Payer: Self-pay

## 2022-06-23 ENCOUNTER — Other Ambulatory Visit (HOSPITAL_COMMUNITY): Payer: Managed Care, Other (non HMO)

## 2022-06-24 NOTE — Telephone Encounter (Signed)
Spoke with patient and detailed gave instructions about her scheduled STRESS TEST on 06/26/22 at 7:30.

## 2022-06-25 ENCOUNTER — Ambulatory Visit (HOSPITAL_COMMUNITY): Payer: Managed Care, Other (non HMO)

## 2022-06-26 ENCOUNTER — Other Ambulatory Visit: Payer: Self-pay | Admitting: Internal Medicine

## 2022-06-26 ENCOUNTER — Ambulatory Visit (HOSPITAL_COMMUNITY): Payer: Managed Care, Other (non HMO) | Attending: Internal Medicine

## 2022-06-26 ENCOUNTER — Ambulatory Visit
Admission: RE | Admit: 2022-06-26 | Discharge: 2022-06-26 | Disposition: A | Discharge status: Home / Self Care | Payer: Managed Care, Other (non HMO) | Source: Ambulatory Visit | Attending: Internal Medicine | Admitting: Internal Medicine

## 2022-06-26 ENCOUNTER — Ambulatory Visit (HOSPITAL_COMMUNITY): Payer: Managed Care, Other (non HMO)

## 2022-06-26 ENCOUNTER — Telehealth: Payer: Self-pay | Admitting: *Deleted

## 2022-06-26 DIAGNOSIS — I25119 Atherosclerotic heart disease of native coronary artery with unspecified angina pectoris: Secondary | ICD-10-CM

## 2022-06-26 DIAGNOSIS — M67479 Ganglion, unspecified ankle and foot: Secondary | ICD-10-CM

## 2022-06-26 DIAGNOSIS — R072 Precordial pain: Secondary | ICD-10-CM

## 2022-06-26 DIAGNOSIS — R0602 Shortness of breath: Secondary | ICD-10-CM

## 2022-06-26 LAB — MYOCARDIAL PERFUSION IMAGING
LV dias vol: 76 mL (ref 46–106)
LV sys vol: 27 mL
Nuc Stress EF: 65 %
Peak HR: 90 {beats}/min
Rest HR: 74 {beats}/min
Rest Nuclear Isotope Dose: 10.6 mCi
SDS: 2
SRS: 2
SSS: 4
ST Depression (mm): 0 mm
Stress Nuclear Isotope Dose: 31.8 mCi
TID: 1.08

## 2022-06-26 MED ORDER — TECHNETIUM TC 99M TETROFOSMIN IV KIT
10.6000 | PACK | Freq: Once | INTRAVENOUS | Status: AC | PRN
  Administered 2022-06-26: 10.6 via INTRAVENOUS

## 2022-06-26 MED ORDER — TECHNETIUM TC 99M TETROFOSMIN IV KIT
31.8000 | PACK | Freq: Once | INTRAVENOUS | Status: AC | PRN
  Administered 2022-06-26: 31.8 via INTRAVENOUS

## 2022-06-26 MED ORDER — REGADENOSON 0.4 MG/5ML IV SOLN
0.4000 mg | Freq: Once | INTRAVENOUS | Status: AC
  Administered 2022-06-26: 0.4 mg via INTRAVENOUS

## 2022-06-26 NOTE — Telephone Encounter (Signed)
Per pt, [employer] approved the Lexi and she has already had it done.  She stated she got a letter and all and was advised to keep up with that just incase [employer] comes back and states they aren't paying.

## 2022-06-26 NOTE — Telephone Encounter (Signed)
-----   Message from Loa Socks, LPN sent at 04/13/8117  1:08 PM EDT ----- Regarding: FW: PEER TO PEER REQUEST  ----- Message ----- From: Beatrice Lecher, PA-C Sent: 06/25/2022  12:22 PM EDT To: Francine Graven; Eilleen Kempf, RN; # Subject: RE: PEER TO PEER REQUEST                       Peer to peer done and Spect is not getting approved either. The only thing they will approve is an exercise treadmill test. Triage -  Please cancel spect and PET. Please arrange plain exercise treadmill test.  Tereso Newcomer, PA-C    06/25/2022 12:21 PM   ----- Message ----- From: Francine Graven Sent: 06/25/2022  10:35 AM EDT To: Corky Crafts, MD; Beatrice Lecher, PA-C; # Subject: PEER TO PEER REQUEST                           Good morning,   [employer]HC/Medicaid community plan denied pt's auth for nuclear stress test.  Rosann Auerbach which is primary, approved the auth and pt is having procedure done today. [employer] billing is recommending a peer to peer be completed to approve the auth with [facility].  A peer to peer review is available today at 5pm.  Discussion can be scheduled by contacting [phone], option #1. Case# [id number].  Any MD or APP/PA can complete this discussion.   Will someone be available today for discussion?  Thanks, [employer]

## 2022-06-28 ENCOUNTER — Encounter: Payer: Self-pay | Admitting: Physician Assistant

## 2022-06-30 ENCOUNTER — Ambulatory Visit (INDEPENDENT_AMBULATORY_CARE_PROVIDER_SITE_OTHER): Payer: Managed Care, Other (non HMO) | Admitting: Orthopedic Surgery

## 2022-06-30 ENCOUNTER — Encounter: Payer: Self-pay | Admitting: Orthopedic Surgery

## 2022-06-30 DIAGNOSIS — M67471 Ganglion, right ankle and foot: Secondary | ICD-10-CM | POA: Diagnosis not present

## 2022-06-30 NOTE — Progress Notes (Signed)
Office Visit Note   Patient: Stacy [name]           Date of Birth: 1975/06/22           MRN: [id number] Visit Date: 06/30/2022              Requested by: Thana Ates, MD [address] [place],  [place] [address] PCP: Thana Ates, MD  Chief Complaint  Patient presents with   Right Foot - Pain      HPI: Patient is a 47 year old woman who was seen for initial evaluation for a painful ganglion cyst lateral base of the right foot.  Patient states she has had pain for a week she states she woke up with a knot on the lateral aspect of her foot.  She complains of swelling and being uncomfortable with shoe wear.  Assessment & Plan: Visit Diagnoses:  1. Ganglion cyst of right foot     Plan: Discussed this may be coming from the calcaneocuboid joint or off the peroneal tendons.  Discussed that she could proceed with conservative treatment with anti-inflammatory cream and a stiff soled shoe or we could proceed with surgical excision.  Discussed that if this did not have the appearance of a typical cyst we would send the cyst to pathology.  Risk of recurrence was discussed.  Patient states she understands will call us when she is ready to proceed with surgery.  Follow-Up Instructions: No follow-ups on file.   Ortho Exam  Patient is alert, oriented, no adenopathy, well-dressed, normal affect, normal respiratory effort. Examination patient has a good dorsalis pedis pulse.  She has a cyst over the lateral aspect right foot over the calcaneocuboid joint.  Palpation of the peroneal tendons proximally is not painful.  This cyst could also be coming off the peroneal tendons.  There is no redness no cellulitis no skin color or temperature changes.  Imaging: No results found. No images are attached to the encounter.  Labs: Lab Results  Component Value Date   HGBA1C 5.2 07/05/2019   ESRSEDRATE 26 (H) 03/19/2021   ESRSEDRATE 10 08/15/2009   REPTSTATUS 12/20/2021 FINAL  12/17/2021   CULT >=100,000 COLONIES/mL ESCHERICHIA COLI (A) 12/17/2021   LABORGA ESCHERICHIA COLI (A) 12/17/2021     Lab Results  Component Value Date   ALBUMIN 4.5 10/14/2021   ALBUMIN 4.5 03/19/2021   ALBUMIN 4.7 07/28/2020    Lab Results  Component Value Date   MG 2.1 08/24/2020   MG 2.1 07/28/2020   MG 2.1 06/28/2020   No results found for: "VD25OH"  No results found for: "PREALBUMIN"    Latest Ref Rng & Units 10/14/2021   11:25 AM 03/19/2021    4:59 PM 08/24/2020    4:02 PM  CBC EXTENDED  WBC 3.4 - 10.8 x10E3/uL 6.4  6.6  7.4   RBC 3.77 - 5.28 x10E6/uL 4.24  4.23  3.74   Hemoglobin 11.1 - 15.9 g/dL 51.8  84.1  66.0   HCT 34.0 - 46.6 % 37.8  38.1  35.4   Platelets 150 - 450 x10E3/uL 328  350.0  318   NEUT# 1.4 - 7.7 K/uL  4.0  4.8   Lymph# 0.7 - 4.0 K/uL  2.0  1.9      There is no height or weight on file to calculate BMI.  Orders:  No orders of the defined types were placed in this encounter.  No orders of the defined types were placed in  this encounter.    Procedures: No procedures performed  Clinical Data: No additional findings.  ROS:  All other systems negative, except as noted in the HPI. Review of Systems  Objective: Vital Signs: There were no vitals taken for this visit.  Specialty Comments:  No specialty comments available.  PMFS History: Patient Active Problem List   Diagnosis Date Noted   Palpitations 05/29/2022   Essential hypertension 05/29/2022   CAD (coronary artery disease) 05/28/2022   Angina pectoris (HCC)    Precordial pain    Mixed dyslipidemia 02/12/2020   Tremor 04/17/2019   Chest tightness 03/24/2013   Acute URI 03/24/2013   RESTLESS LEG SYNDROME 08/15/2009   DEPRESSIVE DISORDER 09/26/2008   Unspecified hypothyroidism 08/03/2008   Past Medical History:  Diagnosis Date   Anxiety    CAD (coronary artery disease) 05/28/2022   TTE 06/12/22: EF >75, no RWMA, GR 1 DD, GLS -25.6, normal RVSF, trivial MR, RAP 8 //  Myoview 06/26/22: EF 65, normal, low risk   Depressive disorder    Fibromyalgia    Hypertriglyceridemia    Hypoglycemia    Hypokalemia    Hypothyroidism    Migraine    Nonobstructive atherosclerosis of coronary artery    Numbness and tingling    hands   RLS (restless legs syndrome)    Seizures (HCC)    x 1 in school   Tremor    hands    Family History  Problem Relation Age of Onset   Colon polyps Mother    Diabetes Mother    Hyperlipidemia Mother    Hypertension Mother    Thyroid disease Mother    Colon polyps Father    Bladder Cancer Father        smoker   Stroke Father    Hyperlipidemia Father    Heart disease Father    Hypothyroidism Father    Hypertension Father    Graves' disease Sister    Colon cancer Maternal Uncle    Colon polyps Maternal Grandmother    Colon polyps Paternal Grandfather    Heart attack Paternal Grandfather    Breast cancer Neg Hx    Esophageal cancer Neg Hx    Rectal cancer Neg Hx    Stomach cancer Neg Hx     Past Surgical History:  Procedure Laterality Date   ABDOMINAL HYSTERECTOMY  2005   has her right ovary left she thinks   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

## 2022-07-22 ENCOUNTER — Other Ambulatory Visit: Payer: Self-pay | Admitting: Interventional Cardiology

## 2022-07-23 DIAGNOSIS — R0602 Shortness of breath: Secondary | ICD-10-CM | POA: Diagnosis not present

## 2022-07-23 DIAGNOSIS — R55 Syncope and collapse: Secondary | ICD-10-CM | POA: Diagnosis not present

## 2022-07-23 DIAGNOSIS — I25119 Atherosclerotic heart disease of native coronary artery with unspecified angina pectoris: Secondary | ICD-10-CM | POA: Diagnosis not present

## 2022-07-23 DIAGNOSIS — R072 Precordial pain: Secondary | ICD-10-CM | POA: Diagnosis not present

## 2022-08-07 ENCOUNTER — Encounter: Payer: Self-pay | Admitting: Physician Assistant

## 2022-08-28 ENCOUNTER — Ambulatory Visit: Payer: Managed Care, Other (non HMO) | Attending: Interventional Cardiology | Admitting: Interventional Cardiology

## 2022-08-28 ENCOUNTER — Encounter: Payer: Self-pay | Admitting: Interventional Cardiology

## 2022-08-28 VITALS — BP 106/74 | HR 78 | Ht 66.0 in | Wt 227.6 lb

## 2022-08-28 DIAGNOSIS — E782 Mixed hyperlipidemia: Secondary | ICD-10-CM

## 2022-08-28 DIAGNOSIS — I10 Essential (primary) hypertension: Secondary | ICD-10-CM | POA: Diagnosis not present

## 2022-08-28 DIAGNOSIS — R0602 Shortness of breath: Secondary | ICD-10-CM | POA: Diagnosis not present

## 2022-08-28 NOTE — Patient Instructions (Signed)
Medication Instructions:  Your physician recommends that you continue on your current medications as directed. Please refer to the Current Medication list given to you today.  *If you need a refill on your cardiac medications before your next appointment, please call your pharmacy*   Lab Work: none If you have labs (blood work) drawn today and your tests are completely normal, you will receive your results only by: MyChart Message (if you have MyChart) OR A paper copy in the mail If you have any lab test that is abnormal or we need to change your treatment, we will call you to review the results.   Testing/Procedures: none   Follow-Up: At [facility], you and your health needs are our priority.  As part of our continuing mission to provide you with exceptional heart care, we have created designated Provider Care Teams.  These Care Teams include your primary Cardiologist (physician) and Advanced Practice Providers (APPs -  Physician Assistants and Nurse Practitioners) who all work together to provide you with the care you need, when you need it.  We recommend signing up for the patient portal called "MyChart".  Sign up information is provided on this After Visit Summary.  MyChart is used to connect with patients for Virtual Visits (Telemedicine).  Patients are able to view lab/test results, encounter notes, upcoming appointments, etc.  Non-urgent messages can be sent to your provider as well.   To learn more about what you can do with MyChart, go to [email or web address].    Your next appointment:   6 month(s)  Provider:   Tereso Newcomer, PA-C     Then, Dr Jacques Navy will plan to see you again in 12 month(s).    Other Instructions

## 2022-08-28 NOTE — Progress Notes (Signed)
Cardiology Office Note   Date:  08/28/2022   ID:  Stacy [name], DOB 04/05/1975, MRN [id number]  PCP:  Thana Ates, MD    No chief complaint on file.    Wt Readings from Last 3 Encounters:  08/28/22 227 lb 9.6 oz (103.2 kg)  06/26/22 223 lb (101.2 kg)  05/29/22 223 lb 9.6 oz (101.4 kg)       History of Present Illness: Stacy [name] is a 47 y.o. female   who I saw in June 2022.  Records show: "who saw Dr. Jacinto Halim.  She requested transfer to [facility].  Prior records from 05/2020 show:   "47 y.o. Caucasian female patient with fibromyalgia, migraine headaches, nonobstructive coronary atherosclerosis by coronary CTA on 04/09/2020 with very mild soft plaque in the distal LAD, hypothyroidism, hyperlipidemia who had seen my partner Dr. Odis Hollingshead on 04/17/2020 for evaluation of chest pain. She was recommended aggressive risk factor modification.  She now presents for a 65-month office visit.   She is accompanied by her husband.  States that she is very concerned about stroke and CV events.  About 2 weeks ago when she woke up she had severe tingling and numbness in her whole left side of the body that lasted for 2-3 minutes associated with tremors.  This resolved but she has been concerned about ongoing tingling and numbness that occurs randomly on her feet and hands.  She also endorses migraine headaches that get severe especially during summertime without aura.   She has no chest pain, but does admit to sedentary lifestyle. She denies leg edema, PND or orthopnea, symptoms of claudication."   Cardiac tests from Dr. Jacinto Halim: "Echocardiogram: 04/11/2020: Left ventricle cavity is normal in size and wall thickness. Normal LV systolic function with EF 74%. Normal global wall motion. Normal diastolic filling pattern. Mild (Grade I) mitral regurgitation. Mild tricuspid regurgitation. No evidence of pulmonary hypertension.   Stress Testing: No results found for this or any previous visit from  the past 1095 days.   Heart Catheterization: None   Coronary CTA 04/09/2020 1. Coronary calcium score of 0. 2. Normal coronary origin with left dominance. 3. CAD-RADS = 3. Moderate stenosis (50-69%) in distal LAD due to an eccentric noncalcified plaque. Overall accuracy is limited due to artifact involving apical LAD and LCx distribution. 4. Study is sent for CT-FFR to further evaluate the distal LAD. CT FFR analysis showed no significant stenosis. 5. Hepatic steatosis."   On 6/24, she had facial tingling, numbness.  She called her neuro MD and she was instructed to go to ER.  She had her MRI and it was negative.  Sx have improved.     She reports daily tingling.  She feels that something is wrong.  SHe has some issues with shaking.  She has chronic shortness of breath but does not report any exertional chest discomfort."   She called the office yesterday: "Pt called to report that she has been having a lot of added stress at home with some family issues... she has had some chest pain on her right sided radiating to her right jaw and some dizziness with rest and exertion... she has not had any pain today but if she develops any further pain or worsening symptoms she will go to the ED... if not she has an appt tomorrow with Dr Eldridge Dace. I sent in an update RX of her nitro which she says he has not had to take at this point. Laying down helps  her to feel better. She will monitor and call if anything changes before her appt tomorrow."   In 10/2021: "She had some arm fatigue and pain. Has been under a lot of stress.  Sx are worse since the stress started.  Had some epiastric pain a few weeks ago.  She was concerned about gallbladder issues.    She does walk some for exercise, 30-35 minutes at a time.  No cardiac sx with that activity.  "  Tries to walk with her 48 year old.    Denies : Chest pain. Dizziness. Leg edema. Nitroglycerin use. Orthopnea. Palpitations. Paroxysmal nocturnal dyspnea.   Syncope.    Shortness of breath with stress.  No problems with exertion.  She feels fatigued.    Normal nuclear stress test in June 2024.    Past Medical History:  Diagnosis Date   Anxiety    CAD (coronary artery disease) 05/28/2022   TTE 06/12/22: EF >75, no RWMA, GR 1 DD, GLS -25.6, normal RVSF, trivial MR, RAP 8 // Myoview 06/26/22: EF 65, normal, low risk   Depressive disorder    Fibromyalgia    Hypertriglyceridemia    Hypoglycemia    Hypokalemia    Hypothyroidism    Migraine    Nonobstructive atherosclerosis of coronary artery    Numbness and tingling    hands   Palpitations 05/29/2022   Monitor 07/2022: NSR, no AFib, no arrhythmias.      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     aspirin EC 81 MG tablet Take 1 tablet (81 mg total) by mouth daily. Swallow whole. 90 tablet 3   DULoxetine (CYMBALTA) 60 MG capsule Take 60 mg by mouth at bedtime.     DULoxetine HCl (CYMBALTA PO) Take 90 mg by mouth daily.     gabapentin (NEURONTIN) 300 MG capsule Take 300 mg by mouth 2 (two) times daily.     lamoTRIgine (LAMICTAL XR) 100 MG 24 hour tablet Take 100 mg by mouth daily.     levothyroxine (SYNTHROID) 75 MCG tablet Take 75 mcg by mouth daily before breakfast.     metoprolol succinate (TOPROL XL) 25 MG 24 hr tablet Take 1 tablet (25 mg total) by mouth at bedtime. 90 tablet 3   nitroGLYCERIN (NITROSTAT) 0.4 MG SL tablet Place 1 tablet (0.4 mg total) under the tongue every 5 (five) minutes as needed for chest pain. If you require more than two tablets five minutes apart go to the nearest ER via EMS. 25 tablet 0   pantoprazole  (PROTONIX) 40 MG tablet Take 40 mg by mouth daily.     pramipexole (MIRAPEX) 1 MG tablet Take 1 mg by mouth at bedtime.     rosuvastatin (CRESTOR) 20 MG tablet TAKE 1/2 (ONE-HALF) TABLET BY MOUTH ONCE A WEEK 15 tablet 3   telmisartan (MICARDIS) 40 MG tablet Take 40 mg by mouth daily.     No current facility-administered medications for this visit.    Allergies:   Isosorbide nitrate    Social History:  The patient  reports that she has never smoked. She has never used smokeless tobacco. She reports current alcohol use. She reports that she does not use drugs.   Family History:  The patient's family history includes Bladder Cancer in her father; Colon cancer in her maternal uncle; Colon polyps in her father, maternal grandmother, mother, and paternal grandfather; Diabetes in her mother; Luiz Blare' disease in her sister; Heart attack in her paternal grandfather; Heart disease in her father; Hyperlipidemia in her father and mother; Hypertension in her father and mother; Hypothyroidism in her father; Stroke in her father; Thyroid disease in her mother.    ROS:  Please see the history of present illness.   Otherwise, review of systems are positive for cyst on right foot.   All other systems are reviewed and negative.    PHYSICAL EXAM: VS:  BP 106/74   Pulse 78   Ht 5\' 6"  (1.676 m)   Wt 227 lb 9.6 oz (103.2 kg)   SpO2 99%   BMI 36.74 kg/m  , BMI Body mass index is 36.74 kg/m. GEN: Well nourished, well developed, in no acute distress HEENT: normal Neck: no JVD, carotid bruits, or masses Cardiac: RRR; no murmurs, rubs, or gallops,no edema  Respiratory:  clear to auscultation bilaterally, normal work of breathing GI: soft, nontender, nondistended, + BS MS: Cyst on right foot Skin: warm and dry, no rash Neuro:  Strength and sensation are intact Psych: euthymic mood, full affect   EKG:   The ekg ordered 5/24 demonstrates NSR   Recent Labs: 10/14/2021: ALT 15; BUN 12; Creatinine, Ser  0.80; Hemoglobin 12.7; Platelets 328; Potassium 4.3; Sodium 140   Lipid Panel    Component Value Date/Time   CHOL 165 04/04/2020 1304   TRIG 130 04/04/2020 1304   HDL 59 04/04/2020 1304   CHOLHDL 3 06/17/2011 1026   VLDL 21.0 06/17/2011 1026   LDLCALC 83 04/04/2020 1304   LDLDIRECT 80 04/04/2020 1304     Other studies Reviewed: Additional studies/ records that were reviewed today with results demonstrating: Labs reviewed.   ASSESSMENT AND PLAN:  Chest pain: No significant chest pain.  Metoprolol was added in 5/24, and palpitations seem decreased.  TIA: no recent events. Hyperlipidemia: LDL 96 HDL 59 total cholesterol 197 in February 2024.  Healthy diet. SHOB: occurs only with stress.  Continue regular walking.  Let us know if any symptoms start to occur with exertion HTN: BP has been stable.  No low BP episodes.  Continue current medications.    Current medicines are reviewed at length with the patient today.  The patient concerns regarding her medicines were addressed.  The following changes have been made:  No change  Labs/ tests ordered today include:  No orders of the defined types were placed in this encounter.   Recommend 150 minutes/week of aerobic exercise Low fat, low carb, high fiber diet recommended  Disposition:   FU in 6 months with Tereso Newcomer   Signed, Lance Muss, MD  08/28/2022 2:25 PM    [facility] [address] [place], [place], [place]  [address] Phone: [phone]; Fax: [phone]

## 2022-10-06 ENCOUNTER — Ambulatory Visit: Payer: Managed Care, Other (non HMO) | Admitting: Diagnostic Neuroimaging

## 2022-12-12 IMAGING — MG MM DIGITAL SCREENING BILAT W/ TOMO AND CAD
8 series · 8 of 24 positions shown · non-contrast
Comparison: Previous exam(s).

CLINICAL DATA: Screening.

EXAM:
DIGITAL SCREENING BILATERAL MAMMOGRAM WITH TOMOSYNTHESIS AND CAD
TECHNIQUE: Bilateral screening digital craniocaudal and mediolateral oblique
mammograms were obtained. Bilateral screening digital breast
tomosynthesis was performed. The images were evaluated with
computer-aided detection.

[L CC synth-2D]
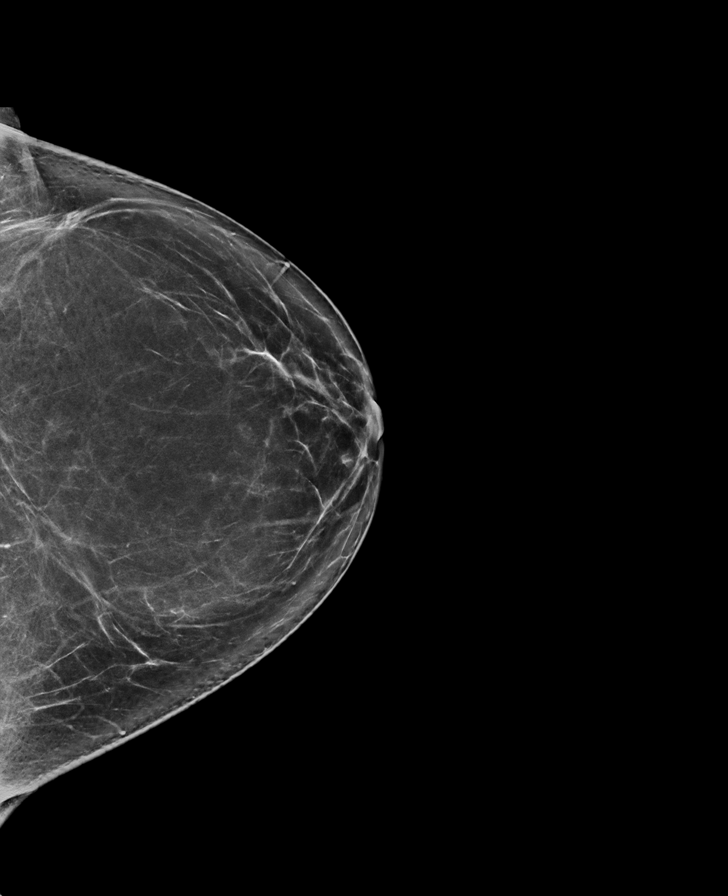

[L MLO synth-2D]
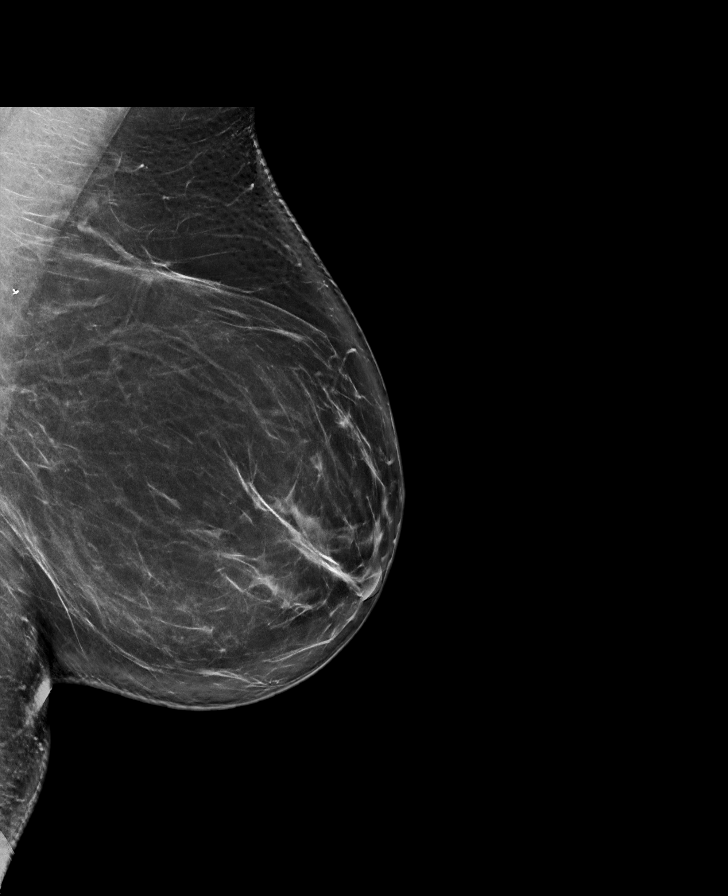

[R CC synth-2D]
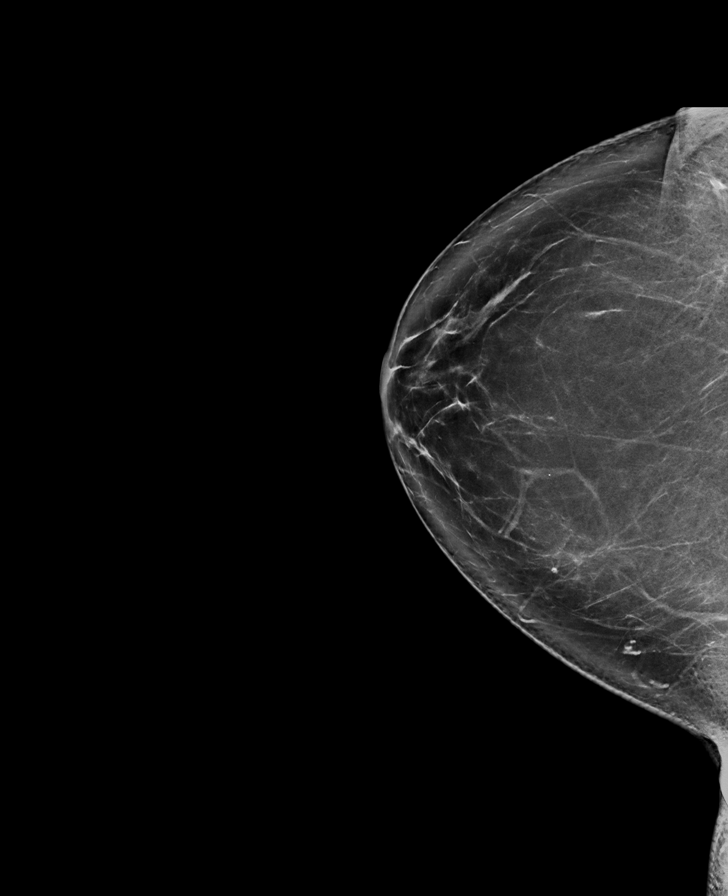

[R MLO synth-2D]
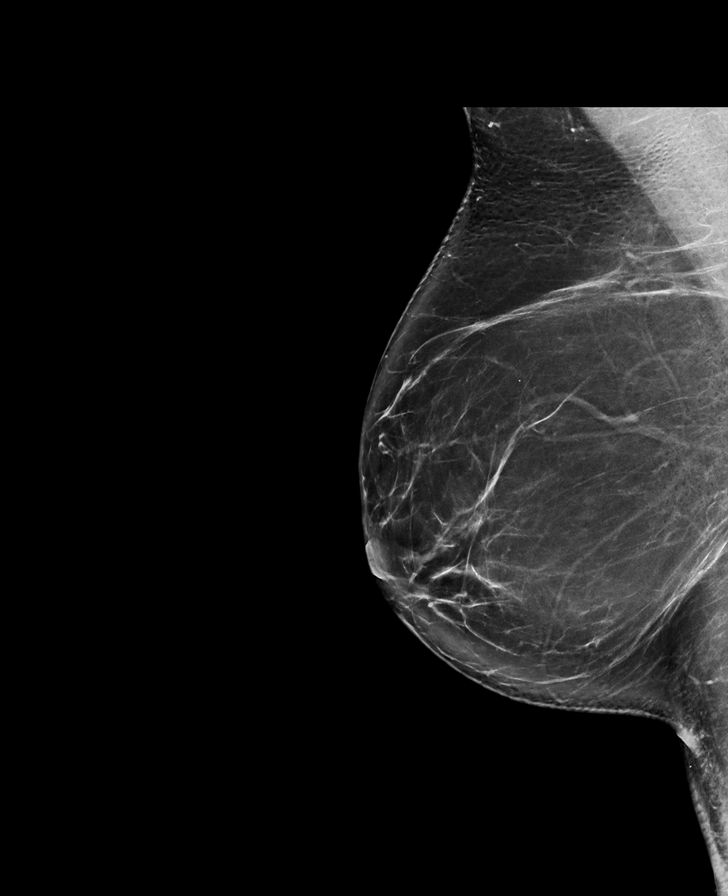

[L CC tomo · tomo slice 45/88.0]
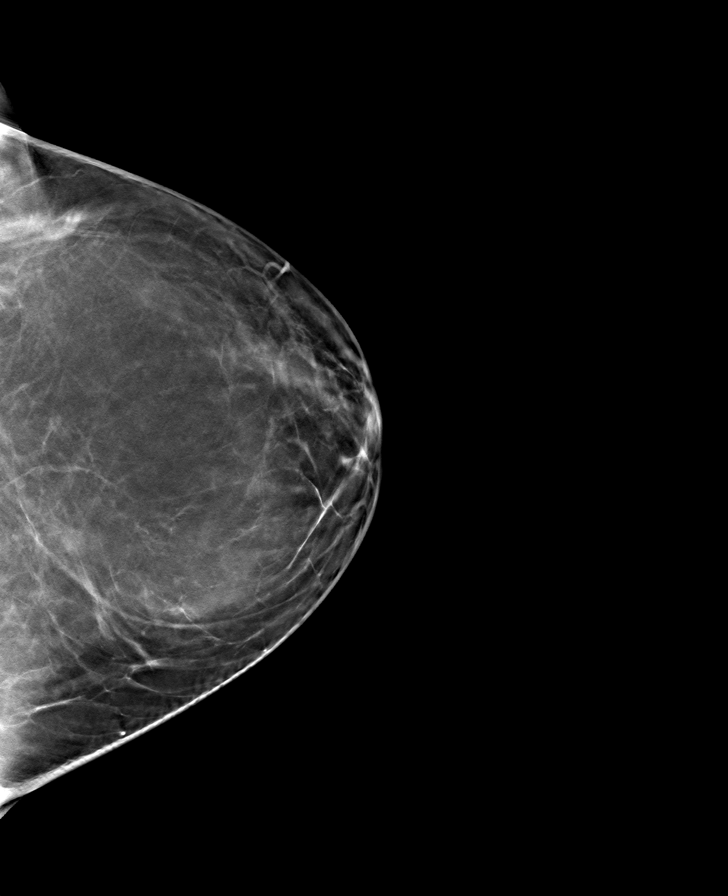

[L MLO tomo · tomo slice 47/93.0]
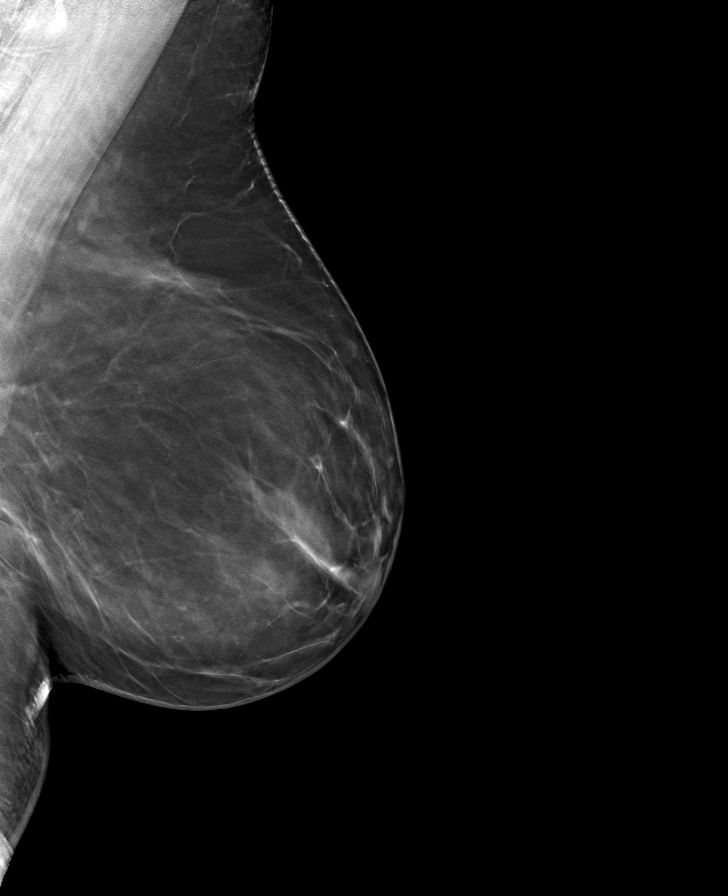

[R CC tomo · tomo slice 48/95.0]
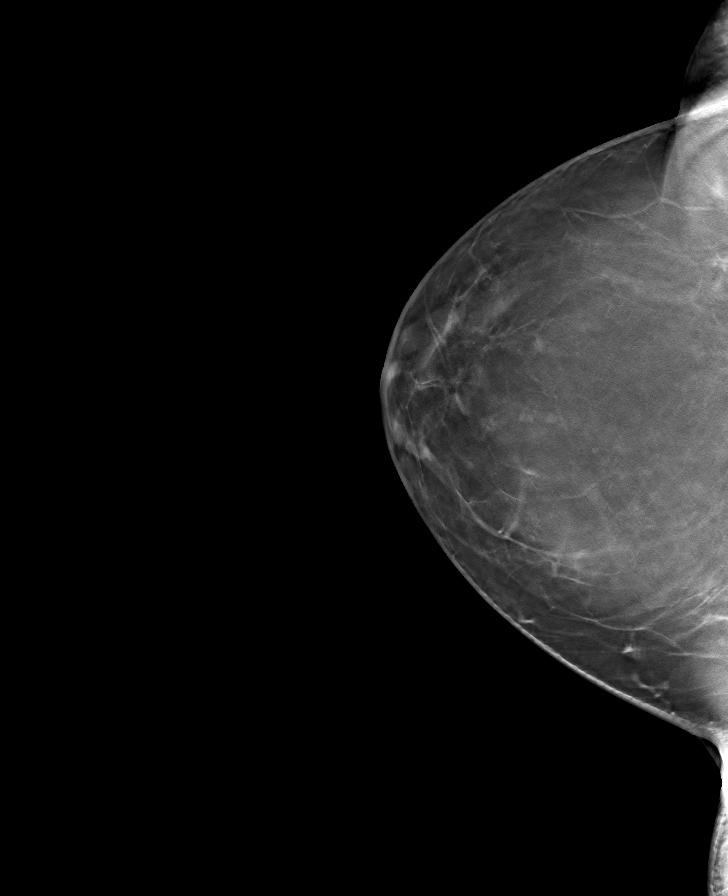

[R MLO tomo · tomo slice 49/97.0]
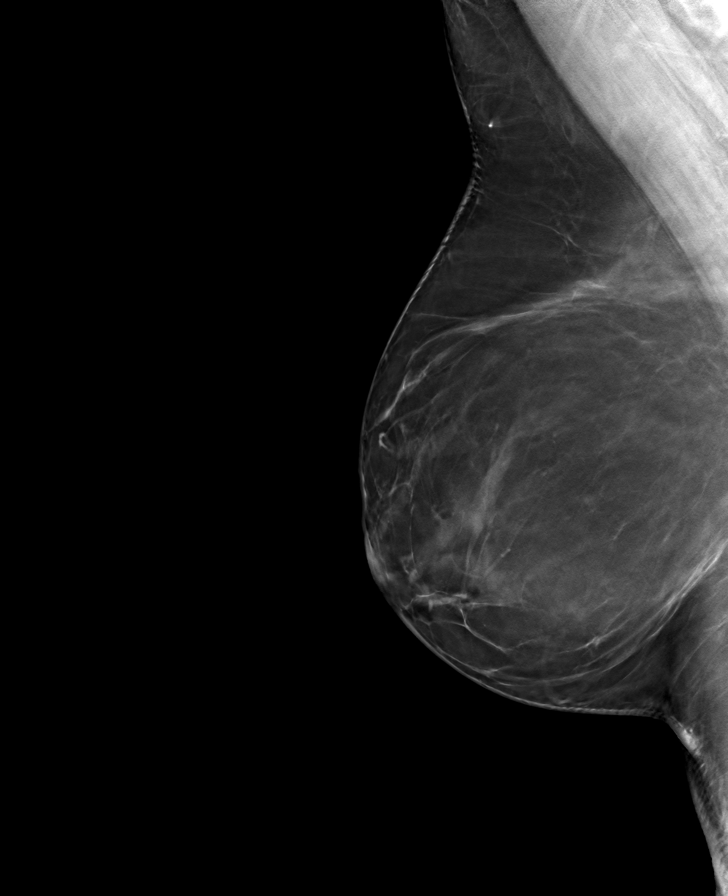

[8 of 24 positions shown; findings below may reference images not displayed]

ACR Breast Density Category b: There are scattered areas of
fibroglandular density.
FINDINGS: There are no findings suspicious for malignancy.
IMPRESSION: No mammographic evidence of malignancy. A result letter of this
screening mammogram will be mailed directly to the patient.

RECOMMENDATION:
Screening mammogram in one year. (Code:51-O-LD2)

BI-RADS CATEGORY  1: Negative.

## 2022-12-21 ENCOUNTER — Ambulatory Visit (HOSPITAL_COMMUNITY)
Admission: RE | Admit: 2022-12-21 | Discharge: 2022-12-21 | Disposition: A | Discharge status: Home / Self Care | Payer: Managed Care, Other (non HMO) | Source: Ambulatory Visit | Attending: Internal Medicine | Admitting: Internal Medicine

## 2022-12-21 ENCOUNTER — Encounter (HOSPITAL_COMMUNITY): Payer: Self-pay

## 2022-12-21 ENCOUNTER — Other Ambulatory Visit (HOSPITAL_COMMUNITY): Payer: Self-pay | Admitting: Internal Medicine

## 2022-12-21 DIAGNOSIS — R109 Unspecified abdominal pain: Secondary | ICD-10-CM | POA: Diagnosis present

## 2022-12-21 DIAGNOSIS — R160 Hepatomegaly, not elsewhere classified: Secondary | ICD-10-CM | POA: Insufficient documentation

## 2022-12-21 DIAGNOSIS — N83201 Unspecified ovarian cyst, right side: Secondary | ICD-10-CM | POA: Diagnosis not present

## 2022-12-21 HISTORY — DX: Essential (primary) hypertension: I10

## 2023-01-18 ENCOUNTER — Encounter: Payer: Self-pay | Admitting: Diagnostic Neuroimaging

## 2023-01-18 ENCOUNTER — Ambulatory Visit (INDEPENDENT_AMBULATORY_CARE_PROVIDER_SITE_OTHER): Payer: Managed Care, Other (non HMO) | Admitting: Diagnostic Neuroimaging

## 2023-01-18 ENCOUNTER — Telehealth: Payer: Self-pay | Admitting: Diagnostic Neuroimaging

## 2023-01-18 VITALS — BP 124/81 | HR 84 | Ht 65.5 in | Wt 229.4 lb

## 2023-01-18 DIAGNOSIS — R258 Other abnormal involuntary movements: Secondary | ICD-10-CM | POA: Diagnosis not present

## 2023-01-18 DIAGNOSIS — G901 Familial dysautonomia [Riley-Day]: Secondary | ICD-10-CM

## 2023-01-18 DIAGNOSIS — R251 Tremor, unspecified: Secondary | ICD-10-CM | POA: Diagnosis not present

## 2023-01-18 DIAGNOSIS — G959 Disease of spinal cord, unspecified: Secondary | ICD-10-CM

## 2023-01-18 NOTE — Telephone Encounter (Signed)
 [employer] no auth required, [employer] auth: [id number] exp. 01/18/23-03/04/23 sent to [facility] nuclear medicine. [phone]

## 2023-01-18 NOTE — Telephone Encounter (Signed)
 MRI brain sent to GI they obtain [place], [facility] medicaid auth: [id number] exp. 01/18/23-03/04/23  MRI cervical sent to GI they obtain [place], [facility] medicaid auth: [id number] exp. 01/18/23-03/04/23  [phone]

## 2023-01-18 NOTE — Progress Notes (Signed)
 [facility]  PATIENT: Stacy [name] DOB: 02/02/1975  REFERRING CLINICIAN: Dwight Trula SQUIBB, MD HISTORY FROM: patient  REASON FOR VISIT: new consult    HISTORICAL  CHIEF COMPLAINT:  Chief Complaint  Patient presents with   Follow-up    Pt in room 7 alone. Here for left side tingling/numbness has been going on for several months. Pt reports hands and feet has tingling and numbness. Tingling/ numbness on left side mainly, has noticed on right side, but not often.  Pt reports she started right side drooling in July and was not aware of the drooling doesn't happen often. Pt has seen PCP Dr.Raju with [facility] had several labs and normal results.    HISTORY OF PRESENT ILLNESS:   UPDATE (01/18/23, VRP): 48 year old female patient here for evaluation of ongoing numbness, tingling, dizziness, abnormal sensations.  Continues to have worsening tremor in the bilateral hands, sometimes throughout her body and internally.  In March 2024 had an episode where she woke up, felt like she could not move, then got up to go to the bathroom.  She was very lightheaded, sweaty, dizzy, hot sensations, almost passed out.  Another episode in July she had intermittent drooling out of the right side of her mouth.  She has been having increasing fatigue and generalized weakness.  UPDATE (06/24/20, VRP): Since last visit, patient here for evaluation of numbness, headaches.  2 months ago patient had episode of tremors and left-sided tingling affecting left face, arm and leg.  She had some nausea associate with this.  She saw cardiology who recommended neurology follow-up for TIA evaluation.  Symptoms lasted several hours and then resolved.  Also with ongoing migraine headaches 1 to 2/week associated with nausea and sensitivity to light.  Since last visit had an EMG which showed bilateral carpal tunnel syndrome, but she has not gone back to hand surgeon.  PRIOR HPI (815): 48 year old female here for  evaluation of tremors, numbness, headaches.  Patient has had tremors in hands for more than 1 year.  Symptoms worse with stress or anxiety.  Tremor mainly present with certain postures or holding objects.  Also has numbness and tingling in hands for 6 to 12 months.  Also has history of headaches with throbbing sensation, tingling sensation, nausea, photophobia, exertional quality, since fourth grade.   REVIEW OF SYSTEMS: Full 14 system review of systems performed and negative with exception of: as per HPI.   ALLERGIES: Allergies  Allergen Reactions   Isosorbide  Nitrate     Other Reaction(s): headache    HOME MEDICATIONS: Outpatient Medications Prior to Visit  Medication Sig Dispense Refill   ALPRAZolam (XANAX) 0.5 MG tablet Take 0.5 mg by mouth 3 (three) times daily as needed.     aspirin  EC 81 MG tablet Take 1 tablet (81 mg total) by mouth daily. Swallow whole. 90 tablet 3   DULoxetine  (CYMBALTA ) 60 MG capsule Take 60 mg by mouth at bedtime. Take 2 tablets by mouth once daily     gabapentin (NEURONTIN) 300 MG capsule Take 300 mg by mouth 2 (two) times daily.     lamoTRIgine (LAMICTAL XR) 100 MG 24 hour tablet Take 100 mg by mouth daily.     levothyroxine  (SYNTHROID ) 75 MCG tablet Take 75 mcg by mouth daily before breakfast.     metoprolol  succinate (TOPROL  XL) 25 MG 24 hr tablet Take 1 tablet (25 mg total) by mouth at bedtime. 90 tablet 3   nitroGLYCERIN  (NITROSTAT ) 0.4 MG SL tablet Place 1  tablet (0.4 mg total) under the tongue every 5 (five) minutes as needed for chest pain. If you require more than two tablets five minutes apart go to the nearest ER via EMS. 25 tablet 0   NURTEC 75 MG TBDP Take 1 tablet by mouth daily.     pantoprazole (PROTONIX) 40 MG tablet Take 40 mg by mouth daily.     pramipexole (MIRAPEX) 1 MG tablet Take 1 mg by mouth at bedtime.     rosuvastatin  (CRESTOR ) 20 MG tablet TAKE 1/2 (ONE-HALF) TABLET BY MOUTH ONCE A WEEK 15 tablet 3   telmisartan (MICARDIS) 40  MG tablet Take 40 mg by mouth daily.     DULoxetine  HCl (CYMBALTA  PO) Take 90 mg by mouth daily.     No facility-administered medications prior to visit.    PAST MEDICAL HISTORY: Past Medical History:  Diagnosis Date   Anxiety    CAD (coronary artery disease) 05/28/2022   TTE 06/12/22: EF >75, no RWMA, GR 1 DD, GLS -25.6, normal RVSF, trivial MR, RAP 8 // Myoview  06/26/22: EF 65, normal, low risk   Depressive disorder    Fibromyalgia    Hypertension    Hypertriglyceridemia    Hypoglycemia    Hypokalemia    Hypothyroidism    Migraine    Nonobstructive atherosclerosis of coronary artery    Numbness and tingling    hands   Palpitations 05/29/2022   Monitor 07/2022: NSR, no AFib, no arrhythmias.      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    FAMILY HISTORY: Family History  Problem Relation Age of Onset   Colon polyps Mother    Diabetes Mother    Hyperlipidemia Mother    Hypertension Mother    Thyroid disease Mother    Colon polyps Father    Bladder Cancer Father        smoker   Stroke Father    Hyperlipidemia Father    Heart disease Father    Hypothyroidism Father    Hypertension Father    Graves' disease Sister    Colon cancer Maternal Uncle    Colon polyps Maternal Grandmother    Colon polyps Paternal Grandfather    Heart attack Paternal Grandfather    Breast cancer Neg Hx    Esophageal cancer Neg Hx    Rectal cancer Neg Hx    Stomach cancer Neg Hx     SOCIAL HISTORY: Social History   Socioeconomic History   Marital status: Legally Separated    Spouse name: Not on file   Number of children: 2   Years of education: 12   Highest education level: Not on file  Occupational History   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   Tobacco Use   Smoking status: Never   Smokeless tobacco: Never  Vaping Use   Vaping status: Never Used  Substance and Sexual Activity   Alcohol use: Yes    Comment: rarely   Drug use: Never   Sexual activity: Yes    Birth control/protection: Surgical  Other Topics Concern   Not on file  Social History Narrative   Married   Regular exercise-yes   Does not work outside the home  Caffeine 1-2 daily   Social Drivers of Corporate Investment Banker Strain: Not on file  Food Insecurity: Not on file  Transportation Needs: Not on file  Physical Activity: Not on file  Stress: Not on file  Social Connections: Not on file  Intimate Partner Violence: Not on file     PHYSICAL EXAM  GENERAL EXAM/CONSTITUTIONAL: Vitals:  Vitals:   01/18/23 0832 01/18/23 0945 01/18/23 0950  BP:  116/69 124/81  Pulse:  80 84  Weight: 229 lb 6.4 oz (104.1 kg)    Height: 5' 5.5 (1.664 m)    Orthostatic VS for the past 24 hrs (Last 3 readings):  BP- Lying Pulse- Lying BP- Standing at 3 minutes Pulse- Standing at 3 minutes  01/18/23 1000 116/69 80 124/81 84    Body mass index is 37.59 kg/m. Wt Readings from Last 3 Encounters:  01/18/23 229 lb 6.4 oz (104.1 kg)  08/28/22 227 lb 9.6 oz (103.2 kg)  06/26/22 223 lb (101.2 kg)   Patient is in no distress; well developed, nourished and groomed; neck is supple  CARDIOVASCULAR: Examination of carotid arteries is normal; no carotid bruits Regular rate and rhythm, no murmurs Examination of peripheral vascular system by observation and palpation is normal  EYES: Ophthalmoscopic exam of optic discs and posterior segments is normal; no papilledema or hemorrhages No results found.  MUSCULOSKELETAL: Gait, strength, tone, movements noted in Neurologic exam below  NEUROLOGIC: MENTAL STATUS:      No data to display         awake, alert, oriented to person, place and time recent and remote memory intact normal attention and  concentration language fluent, comprehension intact, naming intact fund of knowledge appropriate  CRANIAL NERVE:  2nd - no papilledema on fundoscopic exam 2nd, 3rd, 4th, 6th - pupils equal and reactive to light, visual fields full to confrontation, extraocular muscles intact, no nystagmus 5th - facial sensation symmetric 7th - facial strength --> LOWER RIGHT CORNER OF MOUTH 8th - hearing intact 9th - palate elevates symmetrically, uvula midline 11th - shoulder shrug symmetric 12th - tongue protrusion midline MILD MASKED FACIES  MOTOR:  MILD POSTURAL TREMOR IN BUE AND CHIN normal bulk and tone, full strength in the BUE, BLE; EXCEPT SLIGHTLY INCREASED TONE IN BUE; MILD BRADYKINESIA IN BUE AND BLE SLIGHTLY FLATTENING OF BILATERAL APB  SENSORY:  normal and symmetric to light touch, pinprick, temperature, vibration; SLIGHTLY DECR PP IN BILATERAL FINGERS DIGITS 1-4  COORDINATION:  finger-nose-finger, fine finger movements normal  REFLEXES:  deep tendon reflexes 2+ and symmetric  GAIT/STATION:  narrow based gait; DECR ARM SWING ON RIGHT; ABLE TO TOE, HEEL WALK; TANDEM SLIGHT DIFF; ROMBERG NEGATIVE     DIAGNOSTIC DATA (LABS, IMAGING, TESTING) - I reviewed patient records, labs, notes, testing and imaging myself where available.  Lab Results  Component Value Date   WBC 6.4 10/14/2021   HGB 12.7 10/14/2021   HCT 37.8 10/14/2021   MCV 89 10/14/2021   PLT 328 10/14/2021      Component Value Date/Time   NA 140 10/14/2021 1125   K 4.3 10/14/2021 1125   CL 103 10/14/2021 1125   CO2 25 10/14/2021 1125   GLUCOSE 107 (H) 10/14/2021 1125   GLUCOSE 131 (H) 03/19/2021 1659   BUN 12 10/14/2021 1125   CREATININE 0.80 10/14/2021 1125   CALCIUM  9.3 10/14/2021 1125   PROT 6.9 10/14/2021 1125   ALBUMIN 4.5 10/14/2021 1125   AST 12 10/14/2021 1125   ALT 15 10/14/2021  1125   ALKPHOS 104 10/14/2021 1125   BILITOT 0.2 10/14/2021 1125   GFRNONAA >60 08/24/2020 1602   Lab Results   Component Value Date   CHOL 165 04/04/2020   HDL 59 04/04/2020   LDLCALC 83 04/04/2020   LDLDIRECT 80 04/04/2020   TRIG 130 04/04/2020   CHOLHDL 3 06/17/2011   Lab Results  Component Value Date   HGBA1C 5.2 07/05/2019   Lab Results  Component Value Date   VITAMINB12 546 07/05/2019   Lab Results  Component Value Date   TSH 3.270 07/05/2019     04/11/20 TTE Echocardiogram 04/11/2020:  Left ventricle cavity is normal in size and wall thickness. Normal LV  systolic function with EF 74%. Normal global wall motion. Normal diastolic  filling pattern.  Mild (Grade I) mitral regurgitation.  Mild tricuspid regurgitation.  No evidence of pulmonary hypertension.   05/16/20 carotid u/s - Doppler velocity suggests stenosis in the right internal carotid artery  (minimal). Mild homogenous plaque noted in the mid and distal ICA.  - Peak systolic velocities in the left bifurcation, internal, external and common carotid arteries are within normal limits.  - Antegrade right vertebral artery flow. Antegrade left vertebral artery flow.    ASSESSMENT AND PLAN  48 y.o. year old female here with:   Dx:  1. Tremor   2. Bradykinesia   3. Dysautonomia (HCC)   4. Cervical myelopathy (HCC)        PLAN:  INTERMITTENT TREMORS / NUMBNESS / FATIGUE / DYSAUTONOMIA / PRESYNCOPE -On exam patient has subtle parkinsonian features including bradykinesia, cogwheel rigidity, decreased right arm swing; also with mild postural tremor in chin and bilateral upper extremities; intermittent presyncope dysautonomia symptoms raise possibility of Parkinson plus syndrome such as multiple system atrophy; will proceed with further workup - check MRI brain (rule out MS, stroke) - check DATscan  (rule out parkinsonian syndrome)  NUMBNESS, HYPERREFLEXIA - check MRI cervical spine (rule out myelopathy)  HAND NUMBNESS (bilateral CTS) - follow up with Dr. Murrell for bilateral carpal tunnel syndrome for further  tx  MIGRAINE WITHOUT AURA  MIGRAINE PREVENTION  LIFESTYLE CHANGES -Stop or avoid smoking -Decrease or avoid caffeine / alcohol -Eat and sleep on a regular schedule -Exercise several times per week  MIGRAINE RESCUE  - ibuprofen, tylenol as needed - nurtec as needed  Orders Placed This Encounter  Procedures   MR BRAIN W WO CONTRAST   MR CERVICAL SPINE W WO CONTRAST   NM BRAIN DATSCAN  TUMOR LOC INFLAM SPECT 1 DAY   Return in about 3 months (around 04/18/2023) for wednesday afternoon ok if needed.  I spent 40 minutes of face-to-face and non-face-to-face time with patient.  This included previsit chart review, lab review, study review, order entry, electronic health record documentation, patient education.     EDUARD FABIENE HANLON, MD 01/18/2023, 9:39 AM Certified in Neurology, Neurophysiology and Neuroimaging  [facility] [address] [place], [place] [address] [phone]

## 2023-01-28 ENCOUNTER — Encounter (HOSPITAL_COMMUNITY)
Admission: RE | Admit: 2023-01-28 | Discharge: 2023-01-28 | Disposition: A | Discharge status: Home / Self Care | Payer: Managed Care, Other (non HMO) | Source: Ambulatory Visit | Attending: Diagnostic Neuroimaging | Admitting: Diagnostic Neuroimaging

## 2023-01-28 DIAGNOSIS — G901 Familial dysautonomia [Riley-Day]: Secondary | ICD-10-CM | POA: Insufficient documentation

## 2023-01-28 DIAGNOSIS — R251 Tremor, unspecified: Secondary | ICD-10-CM | POA: Diagnosis present

## 2023-01-28 DIAGNOSIS — R258 Other abnormal involuntary movements: Secondary | ICD-10-CM | POA: Diagnosis present

## 2023-01-28 MED ORDER — IOFLUPANE I 123 185 MBQ/2.5ML IV SOLN
4.6600 | Freq: Once | INTRAVENOUS | Status: AC | PRN
  Administered 2023-01-28: 4.66 via INTRAVENOUS

## 2023-01-28 MED ORDER — POTASSIUM IODIDE (ANTIDOTE) 130 MG PO TABS
ORAL_TABLET | ORAL | Status: AC
  Filled 2023-01-28: qty 1

## 2023-01-29 NOTE — Progress Notes (Signed)
Normal scan. No evidence of parkinson's disease. -VRP

## 2023-02-08 ENCOUNTER — Ambulatory Visit
Admission: RE | Admit: 2023-02-08 | Discharge: 2023-02-08 | Disposition: A | Discharge status: Home / Self Care | Payer: Managed Care, Other (non HMO) | Source: Ambulatory Visit | Attending: Diagnostic Neuroimaging | Admitting: Diagnostic Neuroimaging

## 2023-02-08 DIAGNOSIS — R251 Tremor, unspecified: Secondary | ICD-10-CM

## 2023-02-08 DIAGNOSIS — R258 Other abnormal involuntary movements: Secondary | ICD-10-CM

## 2023-02-08 DIAGNOSIS — G901 Familial dysautonomia [Riley-Day]: Secondary | ICD-10-CM

## 2023-02-08 DIAGNOSIS — G959 Disease of spinal cord, unspecified: Secondary | ICD-10-CM

## 2023-02-08 MED ORDER — GADOPICLENOL 0.5 MMOL/ML IV SOLN
10.0000 mL | Freq: Once | INTRAVENOUS | Status: AC | PRN
  Administered 2023-02-08: 10 mL via INTRAVENOUS

## 2023-02-25 ENCOUNTER — Encounter: Payer: Self-pay | Admitting: Diagnostic Neuroimaging

## 2023-03-16 ENCOUNTER — Telehealth: Payer: Self-pay | Admitting: Internal Medicine

## 2023-03-16 NOTE — Telephone Encounter (Signed)
 STAT if HR is under 50 or over 120 (normal HR is 60-100 beats per minute)  What is your heart rate?   HR 56  Do you have a log of your heart rate readings (document readings)?   No  Do you have any other symptoms?  A lot of fatigue, sleeplessness  Patient stated she had a visit with her endocrinologist who is concerned that patient's HR is trending low.

## 2023-03-16 NOTE — Telephone Encounter (Signed)
 Patient identification verified by 2 forms. Stacy Rail, RN    Called and spoke to patient  Patient states:   -Endocrinologist is concerned about her heart rate   -heart rate was 56  -she feels very weak and fatigued   -one time her BP: 96/58, felt like she was going to pass out (few months ago)   -also had episode of weakness in arm, followed by Neurology   -she is not on any beta blockers  Patient scheduled for OV 3/14 at 1:30pm  Reviewed ED warning signs/precautions  Patient verbalized understanding, no questions at this time

## 2023-03-18 NOTE — Progress Notes (Deleted)
 Cardiology Office Note:  .   Date:  03/18/2023  ID:  Stacy [name], DOB 14-Nov-1975, MRN [id number] PCP: Thana Ates, MD  [facility] Providers Cardiologist:  Lance Muss, MD {  History of Present Illness: .   Stacy [name] is a 48 y.o. female w/PMHx of  -- CAD (CCTA 04/09/2020: LAD distal 50-69 (non-calcific), RI, LCx, RCA patent; CAC score 0 >> FFR: No significant stenosis) TIA Seizure d/o HTN, HLD, hypothyroidism, fibromyalgia  She has been seeing cardiology for years, hx of CP, lightheaded, some edema  Over the years, palpitations, w/u has noted non-obstructive CAD by CT TTE last year with preserved LVEF, no VHD, normal stress myoview and a monitor with normal rates/rhythm  Saw dr. Eldridge Dace 08/28/22, seems some time with ongoing symptoms CP, fatigue, increased personal stressors perhaps provoking up tick in symptoms, arm fatigue, worried about her hx of TIA No new w/u felt needed  Following with neurology, recent MRI brain, C spine were good  Called 03/16/23 with reported her endocrinologist was concerned about her HR 50's, reported symptoms of weakness, near syncopal event a few months prior and a BP of 96/58  Today's visit is scheduled or evaluation of symptoms, bradycardia, low BP reports She has not been seen by EP services in the past  ROS:   *** symptoms   Studies Reviewed: Marland Kitchen    EKG done today and reviewed by myself:  ***   July 2024, Monitor   Normal sinus rhythm with rare PACs.   No pathologic arrhythmias.   No atrial fibrillation.   No symptoms reported.  Patch Wear Time:  2 days and 6 hours (2024-07-18T11:38:17-0400 to 2024-07-20T18:33:24-0400) Patient had a min HR of 55 bpm, max HR of 106 bpm, and avg HR of 77 bpm. Predominant underlying rhythm was Sinus Rhythm. Isolated SVEs were rare (<1.0%), SVE Couplets were rare (<1.0%), and SVE Triplets were rare (<1.0%). No Isolated VEs, VE Couplets, or  VE Triplets were present.     06/26/22: stress myoview   The study is normal. The study is low risk.   No ST deviation was noted.   LV perfusion is normal.   Left ventricular function is normal. End diastolic cavity size is normal. End systolic cavity size is normal.   Prior study not available for comparison.   Negative stress test. Low risk study.  06/12/2022: TTE 1. Left ventricular ejection fraction, by estimation, is >75%. The left  ventricle has hyperdynamic function. The left ventricle has no regional  wall motion abnormalities. Left ventricular diastolic parameters are  consistent with Grade I diastolic  dysfunction (impaired relaxation). The average left ventricular global  longitudinal strain is -25.6 %. The global longitudinal strain is normal.   2. Right ventricular systolic function is normal. The right ventricular  size is normal. Tricuspid regurgitation signal is inadequate for assessing  PA pressure.   3. The mitral valve is grossly normal. Trivial mitral valve  regurgitation.   4. The aortic valve is tricuspid. Aortic valve regurgitation is not  visualized.   5. The inferior vena cava is normal in size with <50% respiratory  variability, suggesting right atrial pressure of 8 mmHg.   Comparison(s): Changes from prior study are noted. 04/11/2020: (PCV) LVEF  74%, normal diastolic function.    Risk Assessment/Calculations:    Physical Exam:   VS:  There were no vitals taken for this visit.   Wt Readings from Last 3 Encounters:  01/18/23 229 lb 6.4 oz (104.1 kg)  08/28/22 227 lb 9.6 oz (103.2 kg)  06/26/22 223 lb (101.2 kg)    GEN: Well nourished, well developed in no acute distress NECK: No JVD; No carotid bruits CARDIAC: ***RRR, no murmurs, rubs, gallops RESPIRATORY:  *** CTA b/l without rales, wheezing or rhonchi  ABDOMEN: Soft, non-tender, non-distended EXTREMITIES:  No edema; No deformity   ASSESSMENT AND PLAN: .    Fatigue Weakness ***     {Are you ordering a CV Procedure  (e.g. stress test, cath, DCCV, TEE, etc)?   Press F2        :[id number]}     Dispo: ***  Signed, Sheilah Pigeon, PA-C

## 2023-03-19 ENCOUNTER — Encounter: Payer: Self-pay | Admitting: Cardiovascular Disease

## 2023-03-19 ENCOUNTER — Ambulatory Visit: Admitting: Physician Assistant

## 2023-03-19 ENCOUNTER — Ambulatory Visit: Attending: Cardiovascular Disease | Admitting: Cardiovascular Disease

## 2023-03-19 VITALS — BP 110/72 | HR 95 | Ht 65.5 in | Wt 233.0 lb

## 2023-03-19 DIAGNOSIS — R42 Dizziness and giddiness: Secondary | ICD-10-CM

## 2023-03-19 DIAGNOSIS — E782 Mixed hyperlipidemia: Secondary | ICD-10-CM | POA: Diagnosis not present

## 2023-03-19 DIAGNOSIS — R002 Palpitations: Secondary | ICD-10-CM

## 2023-03-19 DIAGNOSIS — I2510 Atherosclerotic heart disease of native coronary artery without angina pectoris: Secondary | ICD-10-CM | POA: Diagnosis not present

## 2023-03-19 DIAGNOSIS — I10 Essential (primary) hypertension: Secondary | ICD-10-CM

## 2023-03-19 NOTE — Patient Instructions (Signed)
 Medication Instructions:  Your physician has recommended you make the following change in your medication:  1.) stop Toprol XL  *If you need a refill on your cardiac medications before your next appointment, please call your pharmacy*   Lab Work: none If you have labs (blood work) drawn today and your tests are completely normal, you will receive your results only by: MyChart Message (if you have MyChart) OR A paper copy in the mail If you have any lab test that is abnormal or we need to change your treatment, we will call you to review the results.   Testing/Procedures: Your physician has recommended that you wear an event monitor. Event monitors are medical devices that record the heart's electrical activity. Doctors most often [place] these monitors to diagnose arrhythmias. Arrhythmias are problems with the speed or rhythm of the heartbeat. The monitor is a small, portable device. You can wear one while you do your normal daily activities. This is usually used to diagnose what is causing palpitations/syncope (passing out).   Follow-Up: At [facility], you and your health needs are our priority.  As part of our continuing mission to provide you with exceptional heart care, we have created designated Provider Care Teams.  These Care Teams include your primary Cardiologist (physician) and Advanced Practice Providers (APPs -  Physician Assistants and Nurse Practitioners) who all work together to provide you with the care you need, when you need it.   Your next appointment:   8-10 week(s)  Provider:   Verne Carrow, MD

## 2023-03-19 NOTE — Progress Notes (Signed)
 Chief Complaint  Patient presents with   Follow-up    CAD, chest pain   History of Present Illness: 48 yo female with history fibromyalgia, migraines, mild CAD, HTN, HLD, hypothyroidism who is here today for follow up. She has been seen in the past by Dr. Eldridge Dace. She had mild CAD by coronary CTA in April 2022. She was seen by Dr. Eldridge Dace in August 2022 and had c/o numbness in the left side of her body. She had a normal brain MRI in June 2024.  Nuclear stress test in June 2024 with no ischemia. Echo June 2024 with LVEF=75%. Trivial MR. Cardiac monitor August 2024 with sinus, no arrhythmias.   She is here today for follow up. She tells me today that she continues to have "spells" where she feels weak. Her hands go numb. She has seen neurology and has had normal testing. She is seeing an endocrinologist now and is wearing a blood sugar monitor. Her blood sugars are up and down, as low as 51. She notices her heart racing and then feels weak with dizziness. No syncope. No exertional chest pain. She does have chest tightness during her spells. No dyspnea. LE edema that resolves at night.    Primary Care Physician: Thana Ates, MD   Past Medical History:  Diagnosis Date   Anxiety    CAD (coronary artery disease) 05/28/2022   TTE 06/12/22: EF >75, no RWMA, GR 1 DD, GLS -25.6, normal RVSF, trivial MR, RAP 8 // Myoview 06/26/22: EF 65, normal, low risk   Depressive disorder    Fibromyalgia    Hypertension    Hypertriglyceridemia    Hypoglycemia    Hypokalemia    Hypothyroidism    Migraine    Nonobstructive atherosclerosis of coronary artery    Numbness and tingling    hands   Palpitations 05/29/2022   Monitor 07/2022: NSR, no AFib, no arrhythmias.      RLS (restless legs syndrome)    Seizures (HCC)    x 1 in school   Tremor    hands    Past Surgical History:  Procedure Laterality Date   ABDOMINAL HYSTERECTOMY  2005   has her right ovary left she thinks   BREAST BIOPSY Left  09/27/2014   PASH    Current Outpatient Medications  Medication Sig Dispense Refill   cephALEXin (KEFLEX) 500 MG capsule Take 500 mg by mouth 2 (two) times daily.     prazosin (MINIPRESS) 2 MG capsule Take 2 mg by mouth daily.     triamcinolone cream (KENALOG) 0.1 % Apply 1 Application topically 2 (two) times daily.     ALPRAZolam (XANAX) 0.5 MG tablet Take 0.5 mg by mouth 3 (three) times daily as needed.     aspirin EC 81 MG tablet Take 1 tablet (81 mg total) by mouth daily. Swallow whole. 90 tablet 3   DULoxetine (CYMBALTA) 60 MG capsule Take 60 mg by mouth at bedtime. Take 2 tablets by mouth once daily     gabapentin (NEURONTIN) 300 MG capsule Take 300 mg by mouth 2 (two) times daily.     lamoTRIgine (LAMICTAL XR) 100 MG 24 hour tablet Take 100 mg by mouth daily.     levothyroxine (SYNTHROID) 75 MCG tablet Take 75 mcg by mouth daily before breakfast.     nitroGLYCERIN (NITROSTAT) 0.4 MG SL tablet Place 1 tablet (0.4 mg total) under the tongue every 5 (five) minutes as needed for chest pain. If you require more than  two tablets five minutes apart go to the nearest ER via EMS. 25 tablet 0   NURTEC 75 MG TBDP Take 1 tablet by mouth as needed.     pantoprazole (PROTONIX) 40 MG tablet Take 40 mg by mouth as needed.     pramipexole (MIRAPEX) 1 MG tablet Take 1 mg by mouth at bedtime.     rosuvastatin (CRESTOR) 20 MG tablet TAKE 1/2 (ONE-HALF) TABLET BY MOUTH ONCE A WEEK 15 tablet 3   telmisartan (MICARDIS) 40 MG tablet Take 40 mg by mouth daily.     No current facility-administered medications for this visit.    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  Tobacco Use   Smoking  status: Never   Smokeless tobacco: Never  Vaping Use   Vaping status: Never Used  Substance and Sexual Activity   Alcohol use: Yes    Comment: rarely   Drug use: Never   Sexual activity: Yes    Birth control/protection: Surgical  Other Topics Concern   Not on file  Social History Narrative   Married   Regular exercise-yes   Does not work outside the home   Caffeine 1-2 daily   Social Drivers of Corporate investment banker Strain: Not on file  Food Insecurity: Not on file  Transportation Needs: Not on file  Physical Activity: Not on file  Stress: Not on file  Social Connections: Not on file  Intimate Partner Violence: Not on file    Family History  Problem Relation Age of Onset   Colon polyps Mother    Diabetes Mother    Hyperlipidemia Mother    Hypertension Mother    Thyroid disease Mother    Colon polyps Father    Bladder Cancer Father        smoker   Stroke Father    Hyperlipidemia Father    Heart disease Father    Hypothyroidism Father    Hypertension Father    Graves' disease Sister    Colon cancer Maternal Uncle    Colon polyps Maternal Grandmother    Colon polyps Paternal Grandfather    Heart attack Paternal Grandfather    Breast cancer Neg Hx    Esophageal cancer Neg Hx    Rectal cancer Neg Hx    Stomach cancer Neg Hx     Review of Systems:  As stated in the HPI and otherwise negative.   BP 110/72   Pulse 95   Ht 5' 5.5" (1.664 m)   Wt 105.7 kg   SpO2 98%   BMI 38.18 kg/m   Physical Examination: General: Well developed, well nourished, NAD  HEENT: OP clear, mucus membranes moist  SKIN: warm, dry. No rashes. Neuro: No focal deficits  Musculoskeletal: Muscle strength 5/5 all ext  Psychiatric: Mood and affect normal  Neck: No JVD, no carotid bruits, no thyromegaly, no lymphadenopathy.  Lungs:Clear bilaterally, no wheezes, rhonci, crackles Cardiovascular: Regular rate and rhythm. No murmurs, gallops or rubs. Abdomen:Soft. Bowel sounds  present. Non-tender.  Extremities: No lower extremity edema. Pulses are 2 + in the bilateral DP/PT.  EKG:  EKG is ordered today. The ekg ordered today  demonstrates  EKG Interpretation Date/Time:  Friday March 19 2023 15:05:14 EDT Ventricular Rate:  85 PR Interval:  162 QRS Duration:  96 QT Interval:  364 QTC Calculation: 433 R Axis:   56  Text Interpretation: Normal sinus rhythm Normal ECG Confirmed by Verne Carrow [phone]) on 03/19/2023 3:13:00 PM    Recent Labs: No results found for requested labs within last 365 days.   Lipid Panel    Component Value Date/Time   CHOL 165 04/04/2020 1304   TRIG 130 04/04/2020 1304   HDL 59 04/04/2020 1304   CHOLHDL 3 06/17/2011 1026   VLDL 21.0 06/17/2011 1026   LDLCALC 83 04/04/2020 1304   LDLDIRECT 80 04/04/2020 1304     Wt Readings from Last 3 Encounters:  03/19/23 105.7 kg  01/18/23 104.1 kg  08/28/22 103.2 kg    Assessment and Plan:   1. CAD without angina: Mild CAD by coronary CTA in 2022. Stress test in 2024 with no ischemia. No exertional chest pain. I do not think her spells are related to CAD. Continue ASA and Crestor  2. HLD: Lipids followed in primary care. Continue statin  3. HTN: BP is controlled but has been soft at home. Will stop Toprol  4. Dizziness: Unclear if this is related to her blood sugars falling, a neuro cause or secondary to cardiac arrhythmias. Will plan 30 day event monitor. I do not think further ischemic testing is indicated. Normal stress test last year. I have asked her to increase her po fluid intake and increase her salt intake.   Labs/ tests ordered today include:   Orders Placed This Encounter  Procedures   Cardiac event monitor   EKG 12-Lead   Disposition:   F/U with me in 12 months    Signed, Verne Carrow, MD, [facility] 03/19/2023 4:38 PM    [facility] [address] [place], [place], [place]  [address] Phone: [phone]; Fax: [phone]

## 2023-03-23 ENCOUNTER — Encounter: Payer: Self-pay | Admitting: Cardiovascular Disease

## 2023-03-24 ENCOUNTER — Encounter: Payer: Self-pay | Admitting: Cardiovascular Disease

## 2023-03-24 NOTE — Telephone Encounter (Signed)
 Pt walked in to the office.... she asked if we could go ahead and place the monitor now but per [facility] her monitor has been sent to her and should be arriving today... she says she just saw her PCP Dr Margaretann Loveless and they noted that her BP was low while there and her HR was elevated..... I advised her to go ahead and place the monitor as soon ans she gets it today and it will start monitoring... she asked to be seen but I was unable to make her an appt today.... she will go home rest, hydrate and monitor and let us know if anything changes. I also offered to call EMS if she was not feeling well and she declined and I felt comfortable since she had just seen her PCP and was sent out of their office after assessment.

## 2023-03-25 ENCOUNTER — Encounter: Payer: Self-pay | Admitting: Diagnostic Neuroimaging

## 2023-03-30 ENCOUNTER — Encounter: Payer: Self-pay | Admitting: Cardiovascular Disease

## 2023-04-02 ENCOUNTER — Encounter: Payer: Self-pay | Admitting: Diagnostic Neuroimaging

## 2023-04-02 NOTE — Telephone Encounter (Signed)
 I called patient.  MRI brain and cervical spine are unremarkable.  No evidence of multiple sclerosis.  DAT scan also was negative for Parkinson's.  Patient has a 30-day heart monitor currently to rule out cardiac arrhythmia.  Also has continuous glucose monitor to evaluate for hypoglycemia episodes.  Recommend to continue monitoring symptoms and follow-up these other testing.  Will see patient in April for follow-up.  Suanne Marker, MD 04/02/2023, 1:10 PM Certified in Neurology, Neurophysiology and Neuroimaging  [facility] [address], [address] [place], [place] [address] [phone]

## 2023-04-12 ENCOUNTER — Encounter: Payer: Self-pay | Admitting: Cardiovascular Disease

## 2023-04-12 NOTE — Telephone Encounter (Signed)
 Dr. Clifton James aware of recommendations and is in agreement.

## 2023-04-12 NOTE — Telephone Encounter (Signed)
 Stacy [name], Stacy [name]  P Cv Div Ch St Triage Hi Stacy [name], As of 1:07 PM today, there are no events posted for today.  This patient has sent events several times, [name] couple times with CP, "heart racing".  She has frequent sinus tachycardia 100-122 bpm noted. Thanks, Stacy   I called the pt to reassure her the monitor is not showing any worrisome problems that would explain her symptoms... she reports that she is still having chest discomfort now in the center of her chest radiating to her left side and she is very tired... I recommended that she considers having someone take her to the ED but sh says she will wait and continue to monitor and would,ike to be sure that Dr Clifton James is aware.

## 2023-04-12 NOTE — Telephone Encounter (Signed)
 Pt called to report that while sitting down she developed an "uncomfortable" feeling in her right side of her chest that radiated to her neck on the right side and now hurting in her right shoulder. She became very tired and has been resting since and has had some improvement but just has an "odd" feeling in her chest.   NO SOB, no dizziness, no palpitations.   She is asking if her monitor has shown anything this morning that can explain her symptoms... I advised her to trigger the monitor and use her diary when she is not feeling well.   I also explained that the discomfort may not show anything on her monitor unless she is having an arrhythmias which Is unlikely.   She will continue to monitor.   I will send to Dr Gibson Ramp nurse for review.

## 2023-04-13 ENCOUNTER — Other Ambulatory Visit: Payer: Self-pay | Admitting: Obstetrics and Gynecology

## 2023-04-13 DIAGNOSIS — Z1231 Encounter for screening mammogram for malignant neoplasm of breast: Secondary | ICD-10-CM

## 2023-04-14 ENCOUNTER — Telehealth: Payer: Self-pay

## 2023-04-14 NOTE — Telephone Encounter (Signed)
 Called to see if there was a referral on file for her.

## 2023-04-27 ENCOUNTER — Ambulatory Visit: Attending: Cardiovascular Disease

## 2023-04-27 ENCOUNTER — Ambulatory Visit: Payer: Managed Care, Other (non HMO) | Admitting: Diagnostic Neuroimaging

## 2023-04-27 DIAGNOSIS — R002 Palpitations: Secondary | ICD-10-CM | POA: Diagnosis not present

## 2023-04-30 ENCOUNTER — Encounter: Payer: Self-pay | Admitting: Cardiovascular Disease

## 2023-05-10 ENCOUNTER — Other Ambulatory Visit: Payer: Self-pay | Admitting: Physician Assistant

## 2023-05-10 DIAGNOSIS — R55 Syncope and collapse: Secondary | ICD-10-CM

## 2023-05-10 DIAGNOSIS — R0602 Shortness of breath: Secondary | ICD-10-CM

## 2023-05-10 DIAGNOSIS — I25119 Atherosclerotic heart disease of native coronary artery with unspecified angina pectoris: Secondary | ICD-10-CM

## 2023-05-10 DIAGNOSIS — R072 Precordial pain: Secondary | ICD-10-CM

## 2023-06-01 ENCOUNTER — Ambulatory Visit
Admission: RE | Admit: 2023-06-01 | Discharge: 2023-06-01 | Disposition: A | Discharge status: Home / Self Care | Source: Ambulatory Visit | Attending: Obstetrics and Gynecology | Admitting: Obstetrics and Gynecology

## 2023-06-01 ENCOUNTER — Ambulatory Visit

## 2023-06-01 ENCOUNTER — Encounter: Payer: Self-pay | Admitting: Cardiology

## 2023-06-01 ENCOUNTER — Ambulatory Visit: Attending: Cardiology | Admitting: Cardiology

## 2023-06-01 VITALS — BP 136/90 | HR 91 | Ht 65.5 in | Wt 225.8 lb

## 2023-06-01 DIAGNOSIS — I209 Angina pectoris, unspecified: Secondary | ICD-10-CM

## 2023-06-01 DIAGNOSIS — Z1231 Encounter for screening mammogram for malignant neoplasm of breast: Secondary | ICD-10-CM

## 2023-06-01 DIAGNOSIS — E782 Mixed hyperlipidemia: Secondary | ICD-10-CM | POA: Diagnosis not present

## 2023-06-01 DIAGNOSIS — I6529 Occlusion and stenosis of unspecified carotid artery: Secondary | ICD-10-CM

## 2023-06-01 DIAGNOSIS — Z79899 Other long term (current) drug therapy: Secondary | ICD-10-CM

## 2023-06-01 DIAGNOSIS — I2510 Atherosclerotic heart disease of native coronary artery without angina pectoris: Secondary | ICD-10-CM

## 2023-06-01 MED ORDER — PROPRANOLOL HCL 10 MG PO TABS: 10.0000 mg | ORAL_TABLET | Freq: Two times a day (BID) | ORAL | 1 refills | Status: DC

## 2023-06-01 MED ORDER — NITROGLYCERIN 0.4 MG SL SUBL: 0.4000 mg | SUBLINGUAL_TABLET | SUBLINGUAL | 3 refills | Status: AC | PRN

## 2023-06-01 NOTE — Progress Notes (Signed)
 Cardiology Office Note:   Date:  06/01/2023  ID:  Stacy [name], DOB [birth year], MRN [id number] PCP: Stacy Feeling, MD  [facility] Providers Cardiologist:  Antoinette Batman, MD    History of Present Illness:   Discussed the use of AI scribe software for clinical note transcription with the patient, who gave verbal consent to proceed.  History of Present Illness Stacy [name] is a 48 year old female with nonobstructive coronary artery disease, fibromyalgia, migraines, HTN, HLD, hypothyroidism, palpitations who presents for follow-up and to discuss results of a heart monitor.  She experiences episodes of heart racing, chest tightness, and lightheadedness, sometimes accompanied by dizziness but not vertigo. The lightheadedness occurs periodically without a clear trigger and typically lasts about a minute. She describes the sensation as 'like something just comes over me'.  She has a history of mild coronary artery disease/non-calcified CAD in distal LAD diagnosed by coronary CTA in April 2022. CT FFR analysis did not reveal significant stenosis. A stress test in 2024 showed no ischemia, and an echocardiogram in June 2024 showed an ejection fraction of 75%. A cardiac monitor in August 2024 showed sinus rhythm. Recent repeat heart monitor results in April 2025 showed sinus rhythm with an average heart rate of 90 beats per minute. Diary events correlated with sinus tachycardia.   She experiences chest discomfort, described as tightness on the right side that can radiate to her neck. This discomfort is not consistently provoked by exertion or deep breaths. She has a family history of heart issues and reports stress as a potential exacerbating factor. She previously took metoprolol , which was stopped in March due to low blood pressure. She is unsure if the racing heart episodes worsened after stopping the medication.  She reports fluctuating blood pressure readings at home,  ranging from 96/60 to 140/90. There have been no recent changes in her thyroid management, with the last TSH check in February 2025 showing a level of 1.1. Her cholesterol was last checked in February 2024, with a level of 96.  She engages in physical activities such as walking with her granddaughter and yard work. During activities like mowing, she feels okay, but experiences heart racing and needs to stop during more strenuous tasks like weed eating. She has not been using nitroglycerin  due to it being out of date and causing headaches, but she has felt the need to use it a couple of times.  Studies Reviewed:    EKG:        May 08, 2023 Heart Monitor Results  Sinus rhythm with minimum heart rate 60bpm, highest heart rate 140 bpm, average heart rate 90 bpm.  Sinus tachycardia noted during some of the patient's diary entries. Otherwise sinus rhythm present when patient reported symptoms.  Very rare premature atrial ectopic beats (1,365 during the 27 day monitoring period).  06/26/22 Myoview  Stress Test    The study is normal. The study is low risk.   No ST deviation was noted.   LV perfusion is normal.   Left ventricular function is normal. End diastolic cavity size is normal. End systolic cavity size is normal.   Prior study not available for comparison.   Negative stress test. Low risk study.   06/12/22 TTE  IMPRESSIONS     1. Left ventricular ejection fraction, by estimation, is >75%. The left  ventricle has hyperdynamic function. The left ventricle has no regional  wall motion abnormalities. Left ventricular diastolic parameters are  consistent with Grade I diastolic  dysfunction (  impaired relaxation). The average left ventricular global  longitudinal strain is -25.6 %. The global longitudinal strain is normal.   2. Right ventricular systolic function is normal. The right ventricular  size is normal. Tricuspid regurgitation signal is inadequate for assessing  PA pressure.    3. The mitral valve is grossly normal. Trivial mitral valve  regurgitation.   4. The aortic valve is tricuspid. Aortic valve regurgitation is not  visualized.   5. The inferior vena cava is normal in size with <50% respiratory  variability, suggesting right atrial pressure of 8 mmHg.   Comparison(s): Changes from prior study are noted. 04/11/2020: (PCV) LVEF  74%, normal diastolic function.   FINDINGS   Left Ventricle: Left ventricular ejection fraction, by estimation, is  >75%. The left ventricle has hyperdynamic function. The left ventricle has  no regional wall motion abnormalities. The average left ventricular global  longitudinal strain is -25.6 %.  The global longitudinal strain is normal. The left ventricular internal  cavity size was normal in size. There is no left ventricular hypertrophy.  Left ventricular diastolic parameters are consistent with Grade I  diastolic dysfunction (impaired  relaxation). Indeterminate filling pressures.   Right Ventricle: The right ventricular size is normal. No increase in  right ventricular wall thickness. Right ventricular systolic function is  normal. Tricuspid regurgitation signal is inadequate for assessing PA  pressure.   Left Atrium: Left atrial size was normal in size.   Right Atrium: Right atrial size was normal in size.   Pericardium: There is no evidence of pericardial effusion.   Mitral Valve: The mitral valve is grossly normal. Trivial mitral valve  regurgitation.   Tricuspid Valve: The tricuspid valve is normal in structure. Tricuspid  valve regurgitation is not demonstrated.   Aortic Valve: The aortic valve is tricuspid. Aortic valve regurgitation is  not visualized.   Pulmonic Valve: The pulmonic valve was normal in structure. Pulmonic valve  regurgitation is not visualized.   Aorta: The aortic root and ascending aorta are structurally normal, with  no evidence of dilitation.   Venous: The inferior vena cava is  normal in size with less than 50%  respiratory variability, suggesting right atrial pressure of 8 mmHg.    Risk Assessment/Calculations:     HYPERTENSION CONTROL Vitals:   06/01/23 1030 06/01/23 1356  BP: (!) 138/94 (!) 136/90    The patient's blood pressure is elevated above target today.  In order to address the patient's elevated BP: A new medication was prescribed today.; Blood pressure will be monitored at home to determine if medication changes need to be made.           Physical Exam:   VS:  BP (!) 136/90   Pulse 91   Ht 5' 5.5" (1.664 m)   Wt 225 lb 12.8 oz (102.4 kg)   SpO2 96%   BMI 37.00 kg/m    Wt Readings from Last 3 Encounters:  06/01/23 225 lb 12.8 oz (102.4 kg)  03/19/23 233 lb (105.7 kg)  01/18/23 229 lb 6.4 oz (104.1 kg)     Physical Exam Vitals reviewed.  Constitutional:      Appearance: Normal appearance.  HENT:     Head: Normocephalic.     Nose: Nose normal.  Eyes:     Pupils: Pupils are equal, round, and reactive to light.  Cardiovascular:     Rate and Rhythm: Normal rate and regular rhythm.     Pulses: Normal pulses.     Heart sounds:  Normal heart sounds. No murmur heard.    No friction rub. No gallop.  Pulmonary:     Effort: Pulmonary effort is normal.     Breath sounds: Normal breath sounds.  Abdominal:     General: Abdomen is flat.  Musculoskeletal:     Right lower leg: No edema.     Left lower leg: No edema.  Skin:    General: Skin is warm and dry.     Capillary Refill: Capillary refill takes less than 2 seconds.  Neurological:     General: No focal deficit present.     Mental Status: She is alert and oriented to person, place, and time.  Psychiatric:        Mood and Affect: Mood normal.        Behavior: Behavior normal.        Thought Content: Thought content normal.        Judgment: Judgment normal.     ASSESSMENT AND PLAN:     Assessment and Plan Assessment & Plan Sinus tachycardia Episodes of heart racing, chest  tightness, and lightheadedness. Heart monitor showed sinus rhythm with sinus tachycardia. No dangerous arrhythmias detected. Symptoms likely due to sinus tachycardia. Previous metoprolol  was stopped due to hypotension. Considering propranolol for better symptom control. Propranolol may be more effective than metoprolol  for her symptoms and will be started at a low dose to monitor response and side effects. - Prescribe propranolol 10 mg twice daily. - Monitor blood pressure at home and report if low after starting new medication - Educated on potential side effects of propranolol, including dizziness and hypotension.  Nonobstructive coronary artery disease Nonobstructive coronary artery disease with previous coronary CTA showing non-calcified narrowing of distal LAD. FFR without flow limiting disease. Recent stress test showed no ischemia, and echocardiogram showed hyperdynamic LVEF. Symptoms of chest discomfort likely related to sinus tachycardia rather than coronary artery disease progression. Reassurance provided by normal stress test and echocardiogram results. - Monitor for any changes in exertional symptoms that may indicate coronary artery disease progression. - Continue ASA 81mg  - Evaluate cholesterol as below  Hypertension Blood pressure readings have been variable, with recent readings of 138/94 mmHg. Previously on telmisartan, which was discontinued due to hypotension.  - Start Propranolol 10mg  twice daily. Propranolol may help stabilize blood pressure by reducing heart rate and stress-related increases. - Monitor blood pressure at home and report if systolic consistently near 100 mmHg. - Adjust propranolol dosing based on blood pressure readings.  Hyperlipidemia Previous LDL cholesterol was 96 mg/dL, higher than desired given coronary artery disease. Currently on low-dose Crestor  (rosuvastatin ) once weekly, which may be insufficient for optimal cholesterol management. LDL target closer  to 70 mg/dL due to coronary artery disease and family history. - Order lipid panel to reassess cholesterol levels. - Consider adjusting Crestor  dosing based on lipid panel results.  Carotid artery stenosis 2022 carotid artery duplex revealed minimal soft plaque in right ICA. Patient now concerned about progression given dizziness and right neck pain. - Order carotid artery duplex to reassess carotid arteries.           Signed, Leala Prince, PA-C

## 2023-06-01 NOTE — Patient Instructions (Signed)
 Medication Instructions:   START TAKING PROPRANOLOL 10 MG BY MOUTH TWICE DAILY--DOSES CAN BE TAKEN EVERY 6 HOURS BUT NO MORE THAN 2 DOSES IN A 24 HR TIME PERIOD  *If you need a refill on your cardiac medications before your next appointment, please call your pharmacy*   Lab Work:  TODAY--DOWNSTAIRS FIRST FLOOR AT [facility]  If you have labs (blood work) drawn today and your tests are completely normal, you will receive your results only by: MyChart Message (if you have MyChart) OR A paper copy in the mail If you have any lab test that is abnormal or we need to change your treatment, we will call you to review the results.   Testing/Procedures:  Your physician has requested that you have a carotid duplex. This test is an ultrasound of the carotid arteries in your neck. It looks at blood flow through these arteries that supply the brain with blood. Allow one hour for this exam. There are no restrictions or special instructions.   Follow-Up:  3 MONTHS WITH AN EXTENDER IN THE OFFICE--PATIENT PREFERS MORNING APPOINTMENTS

## 2023-06-02 ENCOUNTER — Ambulatory Visit: Payer: Self-pay | Admitting: Cardiology

## 2023-06-02 LAB — LIPID PANEL
Chol/HDL Ratio: 2 ratio (ref 0.0–4.4)
Cholesterol, Total: 156 mg/dL (ref 100–199)
HDL: 78 mg/dL (ref >39)
LDL Chol Calc (NIH): 65 mg/dL (ref 0–99)
Triglycerides: 64 mg/dL (ref 0–149)
VLDL Cholesterol Cal: 13 mg/dL (ref 5–40)

## 2023-06-15 ENCOUNTER — Encounter (HOSPITAL_COMMUNITY)

## 2023-06-15 ENCOUNTER — Ambulatory Visit (HOSPITAL_COMMUNITY)
Admission: RE | Admit: 2023-06-15 | Discharge: 2023-06-15 | Disposition: A | Discharge status: Home / Self Care | Source: Ambulatory Visit | Attending: Cardiology | Admitting: Cardiology

## 2023-06-15 DIAGNOSIS — I209 Angina pectoris, unspecified: Secondary | ICD-10-CM | POA: Diagnosis not present

## 2023-06-15 DIAGNOSIS — I6529 Occlusion and stenosis of unspecified carotid artery: Secondary | ICD-10-CM

## 2023-07-16 DIAGNOSIS — R600 Localized edema: Secondary | ICD-10-CM

## 2023-07-16 MED ORDER — POTASSIUM CHLORIDE ER 10 MEQ PO TBCR
10.0000 meq | EXTENDED_RELEASE_TABLET | Freq: Every day | ORAL | 0 refills | Status: DC
Start: 2023-07-16 — End: 2023-08-04

## 2023-07-16 MED ORDER — FUROSEMIDE 20 MG PO TABS: 20.0000 mg | ORAL_TABLET | Freq: Every day | ORAL | 0 refills | Status: DC

## 2023-07-16 NOTE — Telephone Encounter (Signed)
 Spoke with pt regarding her symptoms. Pt stated she has swelling in her legs and feels tight all over. Pt included pictures in her MyChart message. Pt denies any shortness of breath or chest pain. She stated she just feels tight. Pt stated she does not eat processed foods and has been elevating her legs but does not own compression stockings. Pt does not weigh herself so she is unaware of how much weight she has gained. Pt was given ED precautions should she begin to have shortness of breath or chest pain. Pt also stated she is having bladder leakage. Pt was told to follow up with her gynecologist regarding the matter as she has stated Oxybutynin. Taken to DOD Dr. Ladona who suggested we get a BNP, start Lasix  20 mg once daily for [redacted] week along with potassium 10 meq once daily for 1 week and let us  know how she is doing. Pt aware. Pt verbalized understanding. All questions if any were answered.

## 2023-07-17 ENCOUNTER — Ambulatory Visit: Payer: Self-pay | Admitting: Cardiology

## 2023-07-17 LAB — PRO B NATRIURETIC PEPTIDE: NT-Pro BNP: 63 pg/mL (ref 0–249)

## 2023-07-17 NOTE — Progress Notes (Signed)
 BNP does not suggest heart failure and agree with short course of lasix .

## 2023-07-26 ENCOUNTER — Encounter: Payer: Self-pay | Admitting: Cardiovascular Disease

## 2023-08-02 ENCOUNTER — Encounter: Payer: Self-pay | Admitting: Diagnostic Neuroimaging

## 2023-08-03 NOTE — Telephone Encounter (Signed)
 Spoke with Pt who stated the episodes she is experiencing started about 2 weeks ago. Stated she started having more headaches then started feeling tired. Pt stated she would be eating and awaken and have food all over her or all over the floor and not remember that she was eating. She stated she is not aware of what she is doing at times. Still has numbness on left side, mostly hand and fingers and has balance issues and will almost fall when walking or standing still. Pt stated she has had some blurred vision but saw OD and they said she may need reading glasses. However she stated at times she feels like her eyes are moving rapidly side to side. Recently Pt was sitting in a swing on the porch and fell off, but didn't feel it and wasn't aware the fall had occurred. She has also been on the porch and had to be helped into the house and doesn't recall being helped. Pt stated she has almost fallen asleep driving so her son took her keys and will not allow her to drive. Discussed Pt going to ED for urgent eval but she stated she really doesn't want to go. Informed Pt will send message to provider for recommendations and will reach out when he responds. Pt voiced understanding and thanks for taking her call.

## 2023-08-03 NOTE — Telephone Encounter (Signed)
 Pt has called to inquire about why there has not been a response to either of her  my chart messages from yesterday.

## 2023-08-04 ENCOUNTER — Encounter: Payer: Self-pay | Admitting: Diagnostic Neuroimaging

## 2023-08-04 ENCOUNTER — Ambulatory Visit (INDEPENDENT_AMBULATORY_CARE_PROVIDER_SITE_OTHER): Admitting: Diagnostic Neuroimaging

## 2023-08-04 VITALS — BP 131/86 | HR 89 | Ht 65.0 in | Wt 230.0 lb

## 2023-08-04 DIAGNOSIS — G4719 Other hypersomnia: Secondary | ICD-10-CM

## 2023-08-04 NOTE — Telephone Encounter (Addendum)
 Pt has called, she is more than willing to come into office today for a 2:00 appointment with Dr Margaret.  Phone rep unable to schedule.  CMA scheduled pt, phone rep informed pt to arrive at 1:30 for the 2:00 appointment

## 2023-08-04 NOTE — Progress Notes (Signed)
 [facility]  PATIENT: Stacy [name] DOB: [birth year]  REFERRING CLINICIAN: Dwight Trula SQUIBB, MD HISTORY FROM: patient  REASON FOR VISIT: follow up   HISTORICAL  CHIEF COMPLAINT:  Chief Complaint  Patient presents with   Follow-up    Rm 7 with son Pt is well, here to follow up on [facility] msg 08/02/23    HISTORY OF PRESENT ILLNESS:   UPDATE (08/04/23, VRP): Since last visit, now having some issues for excessive daytime sleepiness (falling asleep at dinner, driving, on front porch). Having more issues with memory loss. Has had more depression since separation from husband 3 years ago. Now getting treatment with psychiatry.   UPDATE (01/18/23, VRP): 48 year old female patient here for evaluation of ongoing numbness, tingling, dizziness, abnormal sensations.  Continues to have worsening tremor in the bilateral hands, sometimes throughout her body and internally.  In March 2024 had an episode where she woke up, felt like she could not move, then got up to go to the bathroom.  She was very lightheaded, sweaty, dizzy, hot sensations, almost passed out.  Another episode in July she had intermittent drooling out of the right side of her mouth.  She has been having increasing fatigue and generalized weakness.  UPDATE (06/24/20, VRP): Since last visit, patient here for evaluation of numbness, headaches.  2 months ago patient had episode of tremors and left-sided tingling affecting left face, arm and leg.  She had some nausea associate with this.  She saw cardiology who recommended neurology follow-up for TIA evaluation.  Symptoms lasted several hours and then resolved.  Also with ongoing migraine headaches 1 to 2/week associated with nausea and sensitivity to light.  Since last visit had an EMG which showed bilateral carpal tunnel syndrome, but she has not gone back to hand surgeon.  PRIOR HPI (4455): 48 year old female here for evaluation of tremors, numbness, headaches.  Patient  has had tremors in hands for more than 1 year.  Symptoms worse with stress or anxiety.  Tremor mainly present with certain postures or holding objects.  Also has numbness and tingling in hands for 6 to 12 months.  Also has history of headaches with throbbing sensation, tingling sensation, nausea, photophobia, exertional quality, since fourth grade.   REVIEW OF SYSTEMS: Full 14 system review of systems performed and negative with exception of: as per HPI.   ALLERGIES: Allergies  Allergen Reactions   Isosorbide  Nitrate     Other Reaction(s): headache    HOME MEDICATIONS: Outpatient Medications Prior to Visit  Medication Sig Dispense Refill   ALPRAZolam (XANAX) 0.5 MG tablet Take 0.5 mg by mouth 3 (three) times daily as needed.     aspirin  EC 81 MG tablet Take 1 tablet (81 mg total) by mouth daily. Swallow whole. 90 tablet 3   DULoxetine  (CYMBALTA ) 60 MG capsule Take 60 mg by mouth at bedtime. Take 2 tablets by mouth once daily     gabapentin (NEURONTIN) 300 MG capsule Take 300 mg by mouth 2 (two) times daily.     lamoTRIgine (LAMICTAL XR) 100 MG 24 hour tablet Take 100 mg by mouth daily.     levothyroxine  (SYNTHROID ) 75 MCG tablet Take 75 mcg by mouth daily before breakfast.     nitroGLYCERIN  (NITROSTAT ) 0.4 MG SL tablet Place 1 tablet (0.4 mg total) under the tongue every 5 (five) minutes as needed for chest pain. If you require more than two tablets five minutes apart go to the nearest ER via EMS. 25 tablet 3  NURTEC 75 MG TBDP Take 1 tablet by mouth as needed.     pramipexole (MIRAPEX) 1 MG tablet Take 1 mg by mouth at bedtime.     prazosin (MINIPRESS) 2 MG capsule Take 2 mg by mouth daily.     propranolol  (INDERAL ) 10 MG tablet Take 1 tablet (10 mg total) by mouth 2 (two) times daily. 180 tablet 1   QUEtiapine (SEROQUEL) 25 MG tablet Take 25 mg by mouth at bedtime.     rosuvastatin  (CRESTOR ) 20 MG tablet TAKE 1/2 (ONE-HALF) TABLET BY MOUTH ONCE A WEEK 15 tablet 3   furosemide   (LASIX ) 20 MG tablet Take 1 tablet (20 mg total) by mouth daily. (Patient not taking: Reported on 08/04/2023) 30 tablet 0   potassium chloride  (KLOR-CON ) 10 MEQ tablet Take 1 tablet (10 mEq total) by mouth daily. (Patient not taking: Reported on 08/04/2023) 30 tablet 0   telmisartan (MICARDIS) 40 MG tablet Take 40 mg by mouth daily. (Patient not taking: Reported on 08/04/2023)     No facility-administered medications prior to visit.    PAST MEDICAL HISTORY: Past Medical History:  Diagnosis Date   Anxiety    CAD (coronary artery disease) 05/28/2022   TTE 06/12/22: EF >75, no RWMA, GR 1 DD, GLS -25.6, normal RVSF, trivial MR, RAP 8 // Myoview  06/26/22: EF 65, normal, low risk   Depressive disorder    Fibromyalgia    Hypertension    Hypertriglyceridemia    Hypoglycemia    Hypokalemia    Hypothyroidism    Migraine    Nonobstructive atherosclerosis of coronary artery    Numbness and tingling    hands   Palpitations 05/29/2022   Monitor 07/2022: NSR, no AFib, no arrhythmias.      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    FAMILY HISTORY: Family History  Problem Relation Age of Onset   Colon polyps Mother    Diabetes Mother    Hyperlipidemia Mother    Hypertension Mother    Thyroid disease Mother    Colon polyps Father    Bladder Cancer Father        smoker   Stroke Father    Hyperlipidemia Father    Heart disease Father    Hypothyroidism Father    Hypertension Father    Graves' disease Sister    Colon cancer Maternal Uncle    Colon polyps Maternal Grandmother    Colon polyps Paternal Grandfather    Heart attack Paternal Grandfather    Breast cancer Neg Hx    Esophageal cancer Neg Hx    Rectal cancer Neg Hx    Stomach cancer Neg Hx    BRCA  1/2 Neg Hx     SOCIAL HISTORY: Social History   Socioeconomic History   Marital status: Legally Separated    Spouse name: Not on file   Number of children: 2   Years of education: 12   Highest education level: Not on file  Occupational History   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  Tobacco Use   Smoking status: Never   Smokeless tobacco: Never  Vaping Use   Vaping status: Never Used  Substance and Sexual Activity   Alcohol use:  Yes    Comment: rarely   Drug use: Never   Sexual activity: Yes    Birth control/protection: Surgical  Other Topics Concern   Not on file  Social History Narrative   Married   Regular exercise-yes   Does not work outside the home   Caffeine 1-2 daily   Social Drivers of Corporate investment banker Strain: Not on file  Food Insecurity: Not on file  Transportation Needs: Not on file  Physical Activity: Not on file  Stress: Not on file  Social Connections: Not on file  Intimate Partner Violence: Not on file     PHYSICAL EXAM  GENERAL EXAM/CONSTITUTIONAL: Vitals:  Vitals:   08/04/23 1340  BP: 131/86  Pulse: 89  Weight: 230 lb (104.3 kg)  Height: 5' 5 (1.651 m)  No data found.   Body mass index is 38.27 kg/m. Wt Readings from Last 3 Encounters:  08/04/23 230 lb (104.3 kg)  06/01/23 225 lb 12.8 oz (102.4 kg)  03/19/23 233 lb (105.7 kg)   Patient is in no distress; well developed, nourished and groomed; neck is supple  CARDIOVASCULAR: Examination of carotid arteries is normal; no carotid bruits Regular rate and rhythm, no murmurs Examination of peripheral vascular system by observation and palpation is normal  EYES: Ophthalmoscopic exam of optic discs and posterior segments is normal; no papilledema or hemorrhages No results found.  MUSCULOSKELETAL: Gait, strength, tone, movements noted in Neurologic exam below  NEUROLOGIC: MENTAL STATUS:      No data to display         awake, alert, oriented to person,  place and time recent and remote memory intact normal attention and concentration language fluent, comprehension intact, naming intact fund of knowledge appropriate  CRANIAL NERVE:  2nd - no papilledema on fundoscopic exam 2nd, 3rd, 4th, 6th - pupils equal and reactive to light, visual fields full to confrontation, extraocular muscles intact, no nystagmus 5th - facial sensation symmetric 7th - facial strength SYMM 8th - hearing intact 9th - palate elevates symmetrically, uvula midline 11th - shoulder shrug symmetric 12th - tongue protrusion midline  MOTOR:  NO TREMOR normal bulk and tone, full strength in the BUE, BLE SLIGHTLY FLATTENING OF BILATERAL APB  SENSORY:  normal and symmetric to light touch, pinprick, temperature, vibration; SLIGHTLY DECR PP IN BILATERAL FINGERS DIGITS 1-4  COORDINATION:  finger-nose-finger, fine finger movements normal  REFLEXES:  deep tendon reflexes 2+ and symmetric  GAIT/STATION:  narrow based gait     DIAGNOSTIC DATA (LABS, IMAGING, TESTING) - I reviewed patient records, labs, notes, testing and imaging myself where available.  Lab Results  Component Value Date   WBC 6.4 10/14/2021   HGB 12.7 10/14/2021   HCT 37.8 10/14/2021   MCV 89 10/14/2021   PLT 328 10/14/2021      Component Value Date/Time   NA 140 10/14/2021 1125   K 4.3 10/14/2021 1125   CL 103 10/14/2021 1125   CO2 25 10/14/2021 1125   GLUCOSE 107 (H) 10/14/2021 1125   GLUCOSE 131 (H) 03/19/2021 1659   BUN 12 10/14/2021 1125   CREATININE 0.80 10/14/2021 1125   CALCIUM  9.3 10/14/2021 1125   PROT 6.9 10/14/2021 1125   ALBUMIN 4.5 10/14/2021 1125   AST 12 10/14/2021 1125   ALT 15 10/14/2021 1125   ALKPHOS 104 10/14/2021 1125   BILITOT 0.2 10/14/2021 1125   GFRNONAA >60 08/24/2020 1602   Lab Results  Component Value Date   CHOL 156 06/01/2023  HDL 78 06/01/2023   LDLCALC 65 06/01/2023   LDLDIRECT 80 04/04/2020   TRIG 64 06/01/2023   CHOLHDL 2.0 06/01/2023    Lab Results  Component Value Date   HGBA1C 5.2 07/05/2019   Lab Results  Component Value Date   VITAMINB12 546 07/05/2019   Lab Results  Component Value Date   TSH 3.270 07/05/2019     04/11/20 TTE Echocardiogram 04/11/2020:  Left ventricle cavity is normal in size and wall thickness. Normal LV  systolic function with EF 74%. Normal global wall motion. Normal diastolic  filling pattern.  Mild (Grade I) mitral regurgitation.  Mild tricuspid regurgitation.  No evidence of pulmonary hypertension.   05/16/20 carotid u/s - Doppler velocity suggests stenosis in the right internal carotid artery  (minimal). Mild homogenous plaque noted in the mid and distal ICA.  - Peak systolic velocities in the left bifurcation, internal, external and common carotid arteries are within normal limits.  - Antegrade right vertebral artery flow. Antegrade left vertebral artery flow.  01/28/23 DATscan  - Ioflupane scan within normal limits. No reduced radiotracer activity in basal ganglia to suggest Parkinson's syndrome pathology.  02/08/23  Unremarkable MRI brain with and without contrast.  No acute findings.  02/08/23 Unremarkable MRI cervical spine with and without contrast.  No acute findings.     ASSESSMENT AND PLAN  48 y.o. year old female here with:   Dx:  1. Excessive daytime sleepiness        PLAN:  INTERMITTENT TREMORS / NUMBNESS / FATIGUE / PRESYNCOPE - MRI brain, DATscan  normal - unclear cause; could be related to underlying severe depression - check sleep study to rule out OSA, narcolepsy or other parasomnia for episodes of suddenly falling asleep - follow up with cardiology for syncope evaluation - no driving until event free x 6 months  HAND NUMBNESS (bilateral CTS) - follow up with Dr. Murrell for bilateral carpal tunnel syndrome for further tx  MIGRAINE WITHOUT AURA  MIGRAINE PREVENTION  LIFESTYLE CHANGES -Stop or avoid smoking -Decrease or avoid caffeine /  alcohol -Eat and sleep on a regular schedule -Exercise several times per week  MIGRAINE RESCUE  - ibuprofen, tylenol as needed - nurtec as needed  Orders Placed This Encounter  Procedures   Ambulatory referral to Sleep Studies   Return for pending if symptoms worsen or fail to improve, pending test results.    EDUARD FABIENE HANLON, MD 08/04/2023, 2:48 PM Certified in Neurology, Neurophysiology and Neuroimaging  [facility] [address], [address] [place], [place] [address] [phone]

## 2023-08-04 NOTE — Telephone Encounter (Signed)
 Appt made

## 2023-08-04 NOTE — Patient Instructions (Addendum)
  INTERMITTENT FATIGUE / SYNCOPE / SLEEP ATTACK - MRI brain, DATscan  normal - check sleep study to rule out OSA, narcolepsy or other parasomnia for episodes of suddenly falling asleep - follow up with cardiology for syncope evaluation - no driving until event free x 6 months

## 2023-08-08 NOTE — Progress Notes (Deleted)
 No chief complaint on file.  History of Present Illness: 48 yo female with history fibromyalgia, migraines, mild CAD, HTN, HLD, hypothyroidism who is here today for follow up. She has been seen in the past by Dr. Dann. She had mild CAD by coronary CTA in April 2022. She was seen by Dr. Dann in August 2022 and had c/o numbness in the left side of her body. She had a normal brain MRI in June 2024.  Nuclear stress test in June 2024 with no ischemia. Echo June 2024 with LVEF=75%. Trivial MR. Cardiac monitor August 2024 with sinus, no arrhythmias. She was seen in our office in March 2025 and reported spells where she feels her heart racing and then she feels weak. Her hands go numb. She has seen neurology and has had normal testing. She also reported lower extremity edema that resolves at night. Cardiac monitor April 2025 with sinus and rare PACs. Carotid artery dopplers June 2025 with no carotid disease. She called our office in July 2025 and reported LE edema. BNP normal. She was started on Lasix .   She is here today for follow up. The patient denies any chest pain, dyspnea, palpitations, lower extremity edema, orthopnea, PND, dizziness, near syncope or syncope.    Primary Care Physician: Dwight Trula SQUIBB, MD   Past Medical History:  Diagnosis Date   Anxiety    CAD (coronary artery disease) 05/28/2022   TTE 06/12/22: EF >75, no RWMA, GR 1 DD, GLS -25.6, normal RVSF, trivial MR, RAP 8 // Myoview  06/26/22: EF 65, normal, low risk   Depressive disorder    Fibromyalgia    Hypertension    Hypertriglyceridemia    Hypoglycemia    Hypokalemia    Hypothyroidism    Migraine    Nonobstructive atherosclerosis of coronary artery    Numbness and tingling    hands   Palpitations 05/29/2022   Monitor 07/2022: NSR, no AFib, no arrhythmias.      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     aspirin  EC 81 MG tablet Take 1 tablet (81 mg total) by mouth daily. Swallow whole. 90 tablet 3   DULoxetine  (CYMBALTA ) 60 MG capsule Take 60 mg by mouth at bedtime. Take 2 tablets by mouth once daily     gabapentin (NEURONTIN) 300 MG capsule Take 300 mg by mouth 2 (two) times daily.     lamoTRIgine (LAMICTAL XR) 100 MG 24 hour tablet Take 100 mg by mouth daily.     levothyroxine  (SYNTHROID ) 75 MCG tablet Take 75 mcg by mouth daily before breakfast.     nitroGLYCERIN  (NITROSTAT ) 0.4 MG SL tablet Place 1 tablet (0.4 mg total) under the tongue every 5 (five) minutes as needed for chest pain. If you require more than two tablets five minutes apart go to the nearest ER via EMS. 25 tablet 3   NURTEC 75 MG TBDP Take 1 tablet by mouth as needed.     pramipexole (MIRAPEX) 1 MG tablet Take 1 mg by mouth at bedtime.  prazosin (MINIPRESS) 2 MG capsule Take 2 mg by mouth daily.     propranolol  (INDERAL ) 10 MG tablet Take 1 tablet (10 mg total) by mouth 2 (two) times daily. 180 tablet 1   QUEtiapine (SEROQUEL) 25 MG tablet Take 25 mg by mouth at bedtime.     rosuvastatin  (CRESTOR ) 20 MG tablet TAKE 1/2 (ONE-HALF) TABLET BY MOUTH ONCE A WEEK 15 tablet 3   No current facility-administered medications for this visit.    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  Tobacco Use   Smoking status: Never   Smokeless tobacco: Never  Vaping Use    Vaping status: Never Used  Substance and Sexual Activity   Alcohol use: Yes    Comment: rarely   Drug use: Never   Sexual activity: Yes    Birth control/protection: Surgical  Other Topics Concern   Not on file  Social History Narrative   Married   Regular exercise-yes   Does not work outside the home   Caffeine 1-2 daily   Social Drivers of Corporate investment banker Strain: Not on file  Food Insecurity: Not on file  Transportation Needs: Not on file  Physical Activity: Not on file  Stress: Not on file  Social Connections: Not on file  Intimate Partner Violence: Not on file    Family History  Problem Relation Age of Onset   Colon polyps Mother    Diabetes Mother    Hyperlipidemia Mother    Hypertension Mother    Thyroid disease Mother    Colon polyps Father    Bladder Cancer Father        smoker   Stroke Father    Hyperlipidemia Father    Heart disease Father    Hypothyroidism Father    Hypertension Father    Graves' disease Sister    Colon cancer Maternal Uncle    Colon polyps Maternal Grandmother    Colon polyps Paternal Grandfather    Heart attack Paternal Grandfather    Breast cancer Neg Hx    Esophageal cancer Neg Hx    Rectal cancer Neg Hx    Stomach cancer Neg Hx    BRCA 1/2 Neg Hx     Review of Systems:  As stated in the HPI and otherwise negative.   There were no vitals taken for this visit.  Physical Examination: General: Well developed, well nourished, NAD  HEENT: OP clear, mucus membranes moist  SKIN: warm, dry. No rashes. Neuro: No focal deficits  Musculoskeletal: Muscle strength 5/5 all ext  Psychiatric: Mood and affect normal  Neck: No JVD, no carotid bruits, no thyromegaly, no lymphadenopathy.  Lungs:Clear bilaterally, no wheezes, rhonci, crackles Cardiovascular: Regular rate and rhythm. No murmurs, gallops or rubs. Abdomen:Soft. Bowel sounds present. Non-tender.  Extremities: No lower extremity edema. Pulses are 2 + in the  bilateral DP/PT.  EKG:  EKG is not *** ordered today. The ekg ordered today demonstrates   Recent Labs: 07/16/2023: NT-Pro BNP 63   Lipid Panel    Component Value Date/Time   CHOL 156 06/01/2023 1145   TRIG 64 06/01/2023 1145   HDL 78 06/01/2023 1145  CHOLHDL 2.0 06/01/2023 1145   CHOLHDL 3 06/17/2011 1026   VLDL 21.0 06/17/2011 1026   LDLCALC 65 06/01/2023 1145   LDLDIRECT 80 04/04/2020 1304     Wt Readings from Last 3 Encounters:  08/04/23 230 lb (104.3 kg)  06/01/23 225 lb 12.8 oz (102.4 kg)  03/19/23 233 lb (105.7 kg)    Assessment and Plan:   1. CAD without angina: Mild CAD by coronary CTA in 2022. Stress test in 2024 with no ischemia. No chest pain. Her spells are not felt to be related to CAD. Continue ASA and Crestor .   2. HLD: Lipids followed in primary care. LDL ***. Continue Crestor   3. HTN: BP is normal. Continue propranolol .   4. Dizziness: Unclear etiology. No felt to be due to mild CAD. No evidence of arrhythmias on cardiac monitor in April 2025. No evidence of carotid artery disease by dopplers June 2025. Recent negative neuro evaluation.   5. Lower extremity edema: ***    Labs/ tests ordered today include:   No orders of the defined types were placed in this encounter.  Disposition:   F/U with me in 12 months    Signed, Lonni Cash, MD, [facility] 08/08/2023 4:19 PM    [facility] [address] [place], [place], [place]  [address] Phone: [phone]; Fax: [phone]

## 2023-08-09 ENCOUNTER — Encounter: Payer: Self-pay | Admitting: Cardiovascular Disease

## 2023-08-09 ENCOUNTER — Ambulatory Visit: Attending: Cardiovascular Disease | Admitting: Cardiovascular Disease

## 2023-08-09 ENCOUNTER — Ambulatory Visit: Admitting: Cardiovascular Disease

## 2023-08-09 VITALS — BP 112/74 | HR 78 | Resp 16 | Ht 65.0 in | Wt 236.7 lb

## 2023-08-09 DIAGNOSIS — R55 Syncope and collapse: Secondary | ICD-10-CM

## 2023-08-09 DIAGNOSIS — E782 Mixed hyperlipidemia: Secondary | ICD-10-CM

## 2023-08-09 DIAGNOSIS — I10 Essential (primary) hypertension: Secondary | ICD-10-CM

## 2023-08-09 DIAGNOSIS — R600 Localized edema: Secondary | ICD-10-CM | POA: Diagnosis not present

## 2023-08-09 DIAGNOSIS — I2510 Atherosclerotic heart disease of native coronary artery without angina pectoris: Secondary | ICD-10-CM

## 2023-08-09 NOTE — Progress Notes (Signed)
 Chief Complaint  Patient presents with   Essential hypertension   Follow-up    3 months   History of Present Illness: 48 yo female with history fibromyalgia, migraines, mild CAD, HTN, HLD, hypothyroidism who is here today for follow up. She has been seen in the past by Dr. Dann. She had mild CAD by coronary CTA in April 2022. She was seen by Dr. Dann in August 2022 and had c/o numbness in the left side of her body. She had a normal brain MRI in June 2024.  Nuclear stress test in June 2024 with no ischemia. Echo June 2024 with LVEF=75%. Trivial MR. Cardiac monitor August 2024 with sinus, no arrhythmias. She has been followed by Neurology for workup of tremors and dysautonomia. Brain DaTSCAN  January 2025 not diagnostic for Parkinsonian syndrome. MRI cervical spine in February 2025 normal. Brain MRI February 2025 with no abnormal findings. She was seen in our office in March 2025 and reported spells where she feels her heart racing and then she feels weak. Her hands go numb. She also reported lower extremity edema that resolves at night. Cardiac monitor April 2025 with sinus and rare PACs. Carotid artery dopplers June 2025 with no carotid disease. She called our office in July 2025 and reported LE edema. BNP normal. She was started on Lasix .   She is here today for follow up. The patient denies any chest pain, dyspnea, palpitations, orthopnea, PND. She continues to have episodes of daytime somnolence and falling asleep without warning. She is not sure if she if passing out.She wakes up and can't remember what happened. Extensive neuro workup as above with plans for a sleep study.    Primary Care Physician: Dwight Trula SQUIBB, MD   Past Medical History:  Diagnosis Date   Anxiety    CAD (coronary artery disease) 05/28/2022   TTE 06/12/22: EF >75, no RWMA, GR 1 DD, GLS -25.6, normal RVSF, trivial MR, RAP 8 // Myoview  06/26/22: EF 65, normal, low risk   Depressive disorder    Fibromyalgia     Hypertension    Hypertriglyceridemia    Hypoglycemia    Hypokalemia    Hypothyroidism    Migraine    Nonobstructive atherosclerosis of coronary artery    Numbness and tingling    hands   Palpitations 05/29/2022   Monitor 07/2022: NSR, no AFib, no arrhythmias.      RLS (restless legs syndrome)    Seizures (HCC)    x 1 in school   Tremor    hands    Past Surgical History:  Procedure Laterality Date   ABDOMINAL HYSTERECTOMY  2005   has her right ovary left she thinks   BREAST BIOPSY Left 09/27/2014   PASH    Current Outpatient Medications  Medication Sig Dispense Refill   ALPRAZolam (XANAX) 0.5 MG tablet Take 0.5 mg by mouth 3 (three) times daily as needed.     aspirin  EC 81 MG tablet Take 1 tablet (81 mg total) by mouth daily. Swallow whole. 90 tablet 3   DULoxetine  (CYMBALTA ) 60 MG capsule Take 60 mg by mouth at bedtime. Take 2 tablets by mouth once daily     gabapentin (NEURONTIN) 300 MG capsule Take 300 mg by mouth 2 (two) times daily.     lamoTRIgine (LAMICTAL XR) 100 MG 24 hour tablet Take 100 mg by mouth daily.     levothyroxine  (SYNTHROID ) 75 MCG tablet Take 75 mcg by mouth daily before breakfast.     mirabegron  ER (MYRBETRIQ) 25 MG TB24 tablet Take 25 mg by mouth daily.     nitroGLYCERIN  (NITROSTAT ) 0.4 MG SL tablet Place 1 tablet (0.4 mg total) under the tongue every 5 (five) minutes as needed for chest pain. If you require more than two tablets five minutes apart go to the nearest ER via EMS. 25 tablet 3   NURTEC 75 MG TBDP Take 1 tablet by mouth as needed.     pramipexole (MIRAPEX) 1 MG tablet Take 1 mg by mouth at bedtime.     prazosin (MINIPRESS) 2 MG capsule Take 2 mg by mouth daily.     propranolol  (INDERAL ) 10 MG tablet Take 1 tablet (10 mg total) by mouth 2 (two) times daily. 180 tablet 1   QUEtiapine (SEROQUEL) 25 MG tablet Take 25 mg by mouth at bedtime.     rosuvastatin  (CRESTOR ) 20 MG tablet TAKE 1/2 (ONE-HALF) TABLET BY MOUTH ONCE A WEEK 15 tablet 3   No  current facility-administered medications for this visit.    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  Tobacco Use   Smoking status: Never   Smokeless tobacco: Never  Vaping Use   Vaping status: Never Used  Substance and Sexual Activity   Alcohol use: Not Currently    Comment: rarely   Drug use: Never   Sexual activity: Yes    Birth control/protection: Surgical  Other Topics Concern   Not on file  Social History Narrative   Married   Regular exercise-yes   Does not work outside the home   Caffeine 1-2 daily   Social Drivers of Corporate investment banker Strain: Not on file  Food Insecurity: Not on file  Transportation Needs: Not on file  Physical Activity: Not on file  Stress: Not on file  Social Connections: Not on file  Intimate Partner Violence: Not on file    Family History  Problem Relation Age of Onset   Colon polyps Mother    Diabetes Mother    Hyperlipidemia Mother    Hypertension Mother    Thyroid disease Mother    Colon polyps Father    Bladder Cancer Father        smoker   Stroke Father    Hyperlipidemia Father    Heart disease Father    Hypothyroidism Father    Hypertension Father    Graves' disease Sister    Colon cancer Maternal Uncle    Colon polyps Maternal Grandmother    Colon polyps Paternal Grandfather    Heart attack Paternal Grandfather    Breast cancer Neg Hx    Esophageal cancer Neg Hx    Rectal cancer Neg Hx    Stomach cancer Neg Hx    BRCA 1/2 Neg Hx     Review of Systems:  As stated in the HPI and otherwise negative.   BP 112/74 (BP Location: Left Arm, Patient Position: Sitting, Cuff Size: Large)   Pulse 78    Resp 16   Ht 5' 5 (1.651 m)   Wt 236 lb 11.2 oz (107.4 kg)   SpO2 100%   BMI 39.39 kg/m   Physical Examination: General:  Well developed, well nourished, NAD  HEENT: OP clear, mucus membranes moist  SKIN: warm, dry. No rashes. Neuro: No focal deficits  Musculoskeletal: Muscle strength 5/5 all ext  Psychiatric: Mood and affect normal  Neck: No JVD, no carotid bruits, no thyromegaly, no lymphadenopathy.  Lungs:Clear bilaterally, no wheezes, rhonci, crackles Cardiovascular: Regular rate and rhythm. No murmurs, gallops or rubs. Abdomen:Soft. Bowel sounds present. Non-tender.  Extremities: No lower extremity edema. Pulses are 2 + in the bilateral DP/PT.  EKG:  EKG is not ordered today. The ekg ordered today demonstrates   Recent Labs: 07/16/2023: NT-Pro BNP 63   Lipid Panel    Component Value Date/Time   CHOL 156 06/01/2023 1145   TRIG 64 06/01/2023 1145   HDL 78 06/01/2023 1145   CHOLHDL 2.0 06/01/2023 1145   CHOLHDL 3 06/17/2011 1026   VLDL 21.0 06/17/2011 1026   LDLCALC 65 06/01/2023 1145   LDLDIRECT 80 04/04/2020 1304     Wt Readings from Last 3 Encounters:  08/09/23 236 lb 11.2 oz (107.4 kg)  08/04/23 230 lb (104.3 kg)  06/01/23 225 lb 12.8 oz (102.4 kg)    Assessment and Plan:   1. CAD without angina: Mild CAD by coronary CTA in 2022. Stress test in 2024 with no ischemia. No chest pain. Her spells are not felt to be related to CAD. Continue ASA and Crestor .   2. HLD: Lipids followed in primary care. LDL 65 in May 2025. Continue Crestor   3. HTN: BP is normal. Continue propranolol .   4. Dizziness/syncope/daytime somnolence: Unclear etiology. Not felt to be due to mild CAD. No evidence of arrhythmias on cardiac monitor in 2024 or 2025. No evidence of carotid artery disease by dopplers June 2025. Normal echo in 2024. Recent negative neuro evaluation with normal brain MRI, cervical MRI and DaTSCAN  to rule out Parkinsonian syndrome.  I do not think her episodes are true  cardiac syncope. Will refer to EP for loop recorder.  She is not driving following recent events  5. Lower extremity edema: Mild LE edema, Occurs at night while in bed. Does not seem volume overloaded. Normal BNP recently. No improvement on Lasix .   Labs/ tests ordered today include:   Orders Placed This Encounter  Procedures   Ambulatory referral to Cardiac Electrophysiology   Disposition:   F/U with me in 12 months    Signed, Lonni Cash, MD, [facility] 08/09/2023 3:23 PM    [facility] [address] [place], [place], [place]  [address] Phone: [phone]; Fax: [phone]

## 2023-08-09 NOTE — Patient Instructions (Signed)
 Medication Instructions:  No changes today *If you need a refill on your cardiac medications before your next appointment, please call your pharmacy*  Lab Work: none If you have labs (blood work) drawn today and your tests are completely normal, you will receive your results only by: MyChart Message (if you have MyChart) OR A paper copy in the mail If you have any lab test that is abnormal or we need to change your treatment, we will call you to review the results.  Testing/Procedures: none  Follow-Up: At [facility], you and your health needs are our priority.  As part of our continuing mission to provide you with exceptional heart care, our providers are all part of one team.  This team includes your primary Cardiologist (physician) and Advanced Practice Providers or APPs (Physician Assistants and Nurse Practitioners) who all work together to provide you with the care you need, when you need it.  Your next appointment:   12 month(s)  Provider:   Lonni Cash, MD     Other Instructions You have been referred to Cardiac Electrophysiology for discussion of loop recorder (LINQ)

## 2023-08-12 ENCOUNTER — Encounter: Payer: Self-pay | Admitting: Neurology

## 2023-08-12 ENCOUNTER — Ambulatory Visit: Admitting: Neurology

## 2023-08-12 VITALS — BP 117/80 | HR 78 | Ht 65.0 in | Wt 234.0 lb

## 2023-08-12 DIAGNOSIS — E669 Obesity, unspecified: Secondary | ICD-10-CM

## 2023-08-12 DIAGNOSIS — Z9189 Other specified personal risk factors, not elsewhere classified: Secondary | ICD-10-CM

## 2023-08-12 DIAGNOSIS — G4719 Other hypersomnia: Secondary | ICD-10-CM | POA: Diagnosis not present

## 2023-08-12 DIAGNOSIS — R351 Nocturia: Secondary | ICD-10-CM

## 2023-08-12 DIAGNOSIS — R0683 Snoring: Secondary | ICD-10-CM | POA: Diagnosis not present

## 2023-08-12 DIAGNOSIS — R519 Headache, unspecified: Secondary | ICD-10-CM

## 2023-08-12 NOTE — Progress Notes (Signed)
 Subjective:    Patient ID: Stacy [name] is a 48 y.o. female.  HPI    True Mar, MD, PhD [facility] [address] [address] [place], [place] [address]  Dear Eduard,  I saw your patient, Stacy [name], upon your kind request in my sleep clinic today for initial consultation of her sleep disorder, in particular, concern for underlying obstructive sleep apnea.  The patient is unaccompanied today.  As you know, [name] is a 48 year old female with an underlying medical history of coronary artery disease, restless leg syndrome, remote seizure disorder, anxiety, depression, fibromyalgia, hypertension, hypothyroidism, migraine headaches, tremor, and obesity, who reports snoring and excessive daytime somnolence. She has had worsening somnolence in the past 3 weeks. Her Epworth sleepiness score is 19 out of 24, fatigue severity score is 56 out of 63.  I reviewed your office note from 08/04/2023.  Of note, she is on multiple medications including several potentially sedating medications, including lamotrigine, pramipexole, prazosin, propranolol , quetiapine, alprazolam, duloxetine  and gabapentin. She sees a Therapist, sports.  She reports that she has been on the same medication regimen for years. She is not aware of any family history of sleep apnea.  She does not wake up gasping.  She does have nocturia about once or twice per average night, she has occasionally woken up with a headache.  She does not sleep in the bedroom and her bedtime is variable, between 10 and midnight, rise time between 5 and 6.  She is a restless sleeper and wakes up multiple times in the middle of the night.  She reports that she is in perimenopause.  They have no pets in the household.  She does have a TV in the area she sleeps in but does not have it on at night.  She does not drink any alcohol.  She is a non-smoker and drinks quite a bit of caffeine in the form of soda, 2 bottles per  day, 16.9 ounce size each. She lives with her 36 yo son. She reports an episode recently of falling off her porch swing. She was able to call for her son, but she did not know what led to the fall. She is advised to instruct her son to call 911, if something like this were to ever happen again.  She sleeps on her couch, not in a bed. She sometimes sleeps sitting up. She has had stable weight, not currently actively working on weight loss.   Her Past Medical History Is Significant For: Past Medical History:  Diagnosis Date   Anxiety    CAD (coronary artery disease) 05/28/2022   TTE 06/12/22: EF >75, no RWMA, GR 1 DD, GLS -25.6, normal RVSF, trivial MR, RAP 8 // Myoview  06/26/22: EF 65, normal, low risk   Depression    Depressive disorder    Fibromyalgia    Hypertension    Hypertriglyceridemia    Hypoglycemia    Hypokalemia    Hypothyroidism    Migraine    Nonobstructive atherosclerosis of coronary artery    Numbness and tingling    hands   Palpitations 05/29/2022   Monitor 07/2022: NSR, no AFib, no arrhythmias.      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    Her Family History Is Significant For: Family History  Problem Relation Age of Onset   Colon polyps Mother    Diabetes Mother    Hyperlipidemia Mother    Hypertension Mother    Thyroid disease Mother    Colon polyps Father    Bladder Cancer Father        smoker   Stroke Father    Hyperlipidemia Father    Heart disease Father    Hypothyroidism Father    Hypertension Father    Graves' disease Sister    Colon cancer Maternal Uncle    Colon polyps Maternal Grandmother    Colon polyps Paternal Grandfather    Heart attack Paternal Grandfather    Breast cancer Neg Hx     Esophageal cancer Neg Hx    Rectal cancer Neg Hx    Stomach cancer Neg Hx    BRCA 1/2 Neg Hx    Sleep apnea Neg Hx     Her Social History Is Significant For: Social History   Socioeconomic History   Marital status: Legally Separated    Spouse name: Not on file   Number of children: 2   Years of education: 12   Highest education level: Not on file  Occupational History   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  Tobacco Use   Smoking status: Never   Smokeless tobacco: Never  Vaping Use   Vaping status: Never Used  Substance and Sexual Activity   Alcohol use: Not Currently    Comment: rarely   Drug use: Never   Sexual activity: Yes    Birth control/protection: Surgical  Other Topics Concern   Not on file  Social History Narrative   Lives with son   Regular exercise-yes   Does not work outside the home   Caffeine 1-2 daily   Social Drivers of Corporate investment banker Strain: Not on file  Food Insecurity: Not on file  Transportation Needs: Not on file  Physical Activity: Not on file  Stress: Not on file  Social Connections: Not on file    Her Allergies Are:  Allergies  Allergen Reactions   Isosorbide  Nitrate     Other Reaction(s): headache  :   Her Current Medications Are:  Outpatient Encounter Medications as of 08/12/2023  Medication Sig   ALPRAZolam (XANAX) 0.5 MG tablet Take 0.5 mg by mouth 3 (three) times daily as needed.   aspirin  EC 81 MG tablet Take 1 tablet (81 mg total) by mouth daily. Swallow whole.   DULoxetine  (CYMBALTA ) 60 MG capsule Take 60 mg by mouth at bedtime. Take 2 tablets by mouth once daily   gabapentin (NEURONTIN) 300 MG capsule Take 300 mg by mouth 2 (two) times daily.   lamoTRIgine (LAMICTAL XR) 100 MG 24 hour tablet Take 100 mg by mouth daily.   levothyroxine  (SYNTHROID ) 75 MCG tablet Take 75 mcg by mouth daily before breakfast.   mirabegron ER (MYRBETRIQ) 25 MG TB24 tablet Take 25 mg by mouth daily.   nitroGLYCERIN   (NITROSTAT ) 0.4 MG SL tablet Place 1 tablet (0.4 mg total) under the tongue every 5 (five) minutes as needed for chest pain. If you require more than two tablets five minutes apart go to the nearest ER via EMS.   NURTEC 75 MG TBDP Take 1 tablet by mouth as needed.   pramipexole (MIRAPEX) 1 MG tablet Take 1 mg by mouth at bedtime.  prazosin (MINIPRESS) 2 MG capsule Take 2 mg by mouth daily.   propranolol  (INDERAL ) 10 MG tablet Take 1 tablet (10 mg total) by mouth 2 (two) times daily.   QUEtiapine (SEROQUEL) 25 MG tablet Take 25 mg by mouth at bedtime.   rosuvastatin  (CRESTOR ) 20 MG tablet TAKE 1/2 (ONE-HALF) TABLET BY MOUTH ONCE A WEEK   No facility-administered encounter medications on file as of 08/12/2023.  :   Review of Systems:  Out of a complete 14 point review of systems, all are reviewed and negative with the exception of these symptoms as listed below:   Review of Systems  Neurological:        Pt here for sleep consult Pt snores,headaches,fatigue,controlled hypertension Pt denies cpap Pt states in 4th grade had sleep study due to grand mal seizure     ESS:19 FSS:56    Objective:  Neurological Exam  Physical Exam Physical Examination:   Vitals:   08/12/23 1055  BP: 117/80  Pulse: 78    General Examination: The patient is a very pleasant 48 y.o. female in no acute distress. She appears well-developed and well-nourished and well groomed.   HEENT: Normocephalic, atraumatic, pupils are equal, round and reactive to light, extraocular tracking is good without limitation to gaze excursion or nystagmus noted. Hearing is grossly intact. Face is symmetric with normal facial animation. Speech is clear with no dysarthria noted. There is no hypophonia. There is no lip, neck/head, jaw or voice tremor. Neck is supple with full range of passive and active motion. There are no carotid bruits on auscultation. Oropharynx exam reveals: mild mouth dryness, adequate dental hygiene and mild  airway crowding, due to small airway entry, Mallampati class II, tonsils 1+ bilaterally.  Neck circumference 16 inches.  Tongue protrudes centrally and palate elevates symmetrically, minimal overbite noted.  Chest: Clear to auscultation without wheezing, rhonchi or crackles noted.  Heart: S1+S2+0, regular and normal without murmurs, rubs or gallops noted.   Abdomen: Soft, non-tender and non-distended.  Extremities: There is no pitting edema in the distal lower extremities bilaterally.   Skin: Warm and dry without trophic changes noted.   Musculoskeletal: exam reveals no obvious joint deformities.   Neurologically:  Mental status: The patient is awake, alert and oriented in all 4 spheres. Her immediate and remote memory, attention, language skills and fund of knowledge are appropriate. There is no evidence of aphasia, agnosia, apraxia or anomia. Speech is clear with normal prosody and enunciation. Thought process is linear. Mood is constricted and affect is blunted.  Cranial nerves II - XII are as described above under HEENT exam.  Motor exam: Normal bulk, strength and tone is noted. There is no obvious action or resting tremor.  Fine motor skills and coordination: grossly intact.  Cerebellar testing: No dysmetria or intention tremor. There is no truncal or gait ataxia.  Sensory exam: intact to light touch in the upper and lower extremities.  Gait, station and balance: She stands easily. No veering to one side is noted. No leaning to one side is noted. Posture is age-appropriate and stance is narrow based. Gait shows normal stride length and normal pace. No problems turning are noted.   Assessment and Plan:  In summary, Lekesha Claw is a 48 year old female with an underlying medical history of coronary artery disease, restless leg syndrome, remote seizure disorder, anxiety, depression, fibromyalgia, hypertension, hypothyroidism, migraine headaches, tremor, and obesity, whose history  and physical exam are concerning for sleep disordered breathing, particularly obstructive sleep apnea (  OSA). A laboratory attended sleep study is typically considered gold standard for evaluation of sleep disordered breathing.   I had a long chat with the patient about my findings and the diagnosis of sleep apnea, particularly OSA, its prognosis and treatment options. We talked about medical/conservative treatments, surgical interventions and non-pharmacological approaches for symptom control. I explained, in particular, the risks and ramifications of untreated moderate to severe OSA, especially with respect to developing cardiovascular disease down the road, including congestive heart failure (CHF), difficult to treat hypertension, cardiac arrhythmias (particularly A-fib), neurovascular complications including TIA, stroke and dementia. Even type 2 diabetes has, in part, been linked to untreated OSA. Symptoms of untreated OSA may include (but may not be limited to) daytime sleepiness, nocturia (i.e. frequent nighttime urination), memory problems, mood irritability and suboptimally controlled or worsening mood disorder such as depression and/or anxiety, lack of energy, lack of motivation, physical discomfort, as well as recurrent headaches, especially morning or nocturnal headaches. We talked about the importance of maintaining a healthy lifestyle and striving for healthy weight. In addition, we talked about the importance of striving for and maintaining good sleep hygiene.  She is encouraged to keep a set schedule for her sleep and try to sleep in a spare bedroom if possible.  She does not currently want to sleep in her own bedroom.  We talked about daytime somnolence and how it can tie in with her medication regimen.  In order to evaluate her for a hypersomnolence disorder such as narcolepsy she is advised that she would have to taper off of multiple medications in her regimen which she is not in the position to  do at this time. I recommended a sleep study at this time. I outlined the differences between a laboratory attended sleep study which is considered more comprehensive and accurate over the option of a home sleep test (HST); the latter may lead to underestimation of sleep disordered breathing in some instances and does not help with diagnosing upper airway resistance syndrome and is not accurate enough to diagnose primary central sleep apnea typically. I outlined possible surgical and non-surgical treatment options of OSA, including the use of a positive airway pressure (PAP) device (i.e. CPAP, AutoPAP/APAP or BiPAP in certain circumstances), a custom-made dental device (aka oral appliance, which would require a referral to a specialist dentist or orthodontist typically, and is generally speaking not considered for patients with full dentures or edentulous state), upper airway surgical options, such as traditional UPPP (which is not considered a first-line treatment) or the Inspire device (hypoglossal nerve stimulator, which would involve a referral for consultation with an ENT surgeon, after careful selection, following inclusion criteria - also not first-line treatment). I explained the PAP treatment option to the patient in detail, as this is generally considered first-line treatment.  The patient indicated that she would be willing to try PAP therapy, if the need arises. I explained the importance of being compliant with PAP treatment, not only for insurance purposes but primarily to improve patient's symptoms symptoms, and for the patient's long term health benefit, including to reduce Her cardiovascular risks longer-term.    We will pick up our discussion about the next steps and treatment options after testing.  We will keep her posted as to the test results by phone call and/or MyChart messaging where possible.  We will plan to follow-up in sleep clinic accordingly as well.  I answered all her questions  today and the patient was in agreement.   I encouraged  her to call with any interim questions, concerns, problems or updates or email us  through MyChart.  Generally speaking, sleep test authorizations may take up to 2 weeks, sometimes less, sometimes longer, the patient is encouraged to get in touch with us  if they do not hear back from the sleep lab staff directly within the next 2 weeks.  Thank you very much for allowing me to participate in the care of this nice patient. If I can be of any further assistance to you please do not hesitate to talk to me.  Sincerely,   True Mar, MD, PhD

## 2023-08-12 NOTE — Patient Instructions (Signed)

## 2023-08-23 ENCOUNTER — Telehealth: Payer: Self-pay | Admitting: Neurology

## 2023-08-23 ENCOUNTER — Encounter: Payer: Self-pay | Admitting: Cardiovascular Disease

## 2023-08-23 DIAGNOSIS — I2510 Atherosclerotic heart disease of native coronary artery without angina pectoris: Secondary | ICD-10-CM

## 2023-08-23 DIAGNOSIS — R5383 Other fatigue: Secondary | ICD-10-CM

## 2023-08-23 DIAGNOSIS — R55 Syncope and collapse: Secondary | ICD-10-CM

## 2023-08-23 NOTE — Telephone Encounter (Signed)
 NPSG [employer] pending.

## 2023-08-25 ENCOUNTER — Telehealth: Payer: Self-pay | Admitting: *Deleted

## 2023-08-25 ENCOUNTER — Ambulatory Visit: Admitting: Student

## 2023-08-25 ENCOUNTER — Telehealth: Payer: Self-pay | Admitting: Diagnostic Neuroimaging

## 2023-08-25 ENCOUNTER — Telehealth: Payer: Self-pay | Admitting: Neurology

## 2023-08-25 NOTE — Telephone Encounter (Signed)
 Pt called to follow up about Sleep study being done  the patient stating that had a discussion with MD ,but never heard anything from MD.

## 2023-08-25 NOTE — Telephone Encounter (Signed)
 Pt reports that she saw the cardiologist she was referred to by Dr Margaret. They believe her condition is more Neurological.  Just this afternoon pt had the episode where she was conversing with her son and without her knowing she randomly started a completley different conversation.  Pt is having the issue of not being able to keep her head up and is unable to prevent herself from nodding off, pt reports she is also very swollen.  Phone rep was advised by sleep coordinator that:pt's authorization was just completed today so she will be getting a call to schedule soon. This was relayed to pt.  Please call pt.

## 2023-08-25 NOTE — Telephone Encounter (Signed)
 Spoke w/Pt who stated she saw her cardiologist and was told they do not feel the syncope is related to her heart. The did refer her for a loop recorder but stated her insurance denied. Pt stated she still has swelling in her feet and legs and now in her hands and arms. She stated she feels tight and when she bends her knees she has a burning and stretching sensation. Cardiology started her on Lasix  20 mg daily PRN and she reported it has not helped. Encouraged Pt to drink more water when taking the Lasix  but if she has no greater output than normal to make her cardiologist aware. Discussed sleep study and that will need to take place to give Dr. Margaret more info to treat her excessive daytime sleepiness. Pt voiced understanding. Discussed her understandable frustration but the sleep study results are needed to pursue treatment recommendations. Pt again voiced understanding and thanks. Informed Pt will make Dr. Margaret aware of swelling but again encouraged Pt to call Cards if her output is unchanged while taking Lasix . Pt voiced understanding and thanks for the call.

## 2023-08-25 NOTE — Telephone Encounter (Signed)
 HST [employer] no auth req [employer] denied see below for the denial.

## 2023-08-26 ENCOUNTER — Encounter: Payer: Self-pay | Admitting: Cardiovascular Disease

## 2023-08-27 ENCOUNTER — Ambulatory Visit (INDEPENDENT_AMBULATORY_CARE_PROVIDER_SITE_OTHER): Admitting: Neurology

## 2023-08-27 DIAGNOSIS — G4733 Obstructive sleep apnea (adult) (pediatric): Secondary | ICD-10-CM

## 2023-08-27 DIAGNOSIS — R0683 Snoring: Secondary | ICD-10-CM

## 2023-08-27 DIAGNOSIS — R519 Headache, unspecified: Secondary | ICD-10-CM

## 2023-08-27 DIAGNOSIS — G4734 Idiopathic sleep related nonobstructive alveolar hypoventilation: Secondary | ICD-10-CM

## 2023-08-27 DIAGNOSIS — R351 Nocturia: Secondary | ICD-10-CM

## 2023-08-27 DIAGNOSIS — G4719 Other hypersomnia: Secondary | ICD-10-CM

## 2023-08-27 DIAGNOSIS — Z9189 Other specified personal risk factors, not elsewhere classified: Secondary | ICD-10-CM

## 2023-08-27 DIAGNOSIS — E669 Obesity, unspecified: Secondary | ICD-10-CM

## 2023-08-31 ENCOUNTER — Ambulatory Visit: Payer: Self-pay | Admitting: Cardiovascular Disease

## 2023-08-31 ENCOUNTER — Ambulatory Visit (HOSPITAL_COMMUNITY)
Admission: RE | Admit: 2023-08-31 | Discharge: 2023-08-31 | Disposition: A | Discharge status: Home / Self Care | Source: Ambulatory Visit | Attending: Cardiovascular Disease | Admitting: Cardiovascular Disease

## 2023-08-31 DIAGNOSIS — I2510 Atherosclerotic heart disease of native coronary artery without angina pectoris: Secondary | ICD-10-CM | POA: Diagnosis present

## 2023-08-31 DIAGNOSIS — R5383 Other fatigue: Secondary | ICD-10-CM | POA: Diagnosis present

## 2023-08-31 DIAGNOSIS — R55 Syncope and collapse: Secondary | ICD-10-CM | POA: Insufficient documentation

## 2023-08-31 LAB — ECHOCARDIOGRAM COMPLETE
Area-P 1/2: 3.46 cm2
S' Lateral: 2.8 cm

## 2023-09-03 ENCOUNTER — Ambulatory Visit (HOSPITAL_COMMUNITY)

## 2023-09-07 ENCOUNTER — Telehealth: Payer: Self-pay | Admitting: Neurology

## 2023-09-07 ENCOUNTER — Encounter: Payer: Self-pay | Admitting: Neurology

## 2023-09-10 NOTE — Progress Notes (Signed)
 See procedure note.

## 2023-09-11 ENCOUNTER — Other Ambulatory Visit: Payer: Self-pay | Admitting: Interventional Cardiology

## 2023-09-13 ENCOUNTER — Ambulatory Visit: Payer: Self-pay | Admitting: Neurology

## 2023-09-13 DIAGNOSIS — G4733 Obstructive sleep apnea (adult) (pediatric): Secondary | ICD-10-CM

## 2023-09-13 DIAGNOSIS — G4734 Idiopathic sleep related nonobstructive alveolar hypoventilation: Secondary | ICD-10-CM

## 2023-09-13 NOTE — Procedures (Signed)
 [facility]  HOME SLEEP TEST (SANSA) REPORT (Mail-Out Device):   STUDY DATE: 08/29/2023  DOB: April 20, 1975  MRN: [id number]  ORDERING CLINICIAN: True Mar, MD, PhD   REFERRING CLINICIAN: Dr. Eduard Hanlon  CLINICAL INFORMATION/HISTORY (obtained from visit note dated 08/12/2023): 48 year old female with an underlying medical history of coronary artery disease, restless leg syndrome, remote seizure disorder, anxiety, depression, fibromyalgia, hypertension, hypothyroidism, migraine headaches, tremor, and obesity, who reports snoring and excessive daytime somnolence.   PATIENT'S LAST REPORTED EPWORTH SLEEPINESS SCORE (ESS): 19/24.  BMI (at the time of sleep clinic visit and/or test date): 38.9.  Kg/m  FINDINGS:   Study Protocol:    The SANSA single-point-of-skin-contact chest-worn sensor - an FDA cleared and DOT approved type 4 home sleep test device - measures eight physiological channels,  including blood oxygen saturation (measured via PPG [photoplethysmography]), EKG-derived heart rate, respiratory effort, chest movement (measured via accelerometer), snoring, body position, and actigraphy. The device is designed to be worn for up to 10 hours per study.   Sleep Summary:   Total Recording Time (hours, min): 9 hours, 53 min  Total Effective Sleep Time (hours, min):  8 hours, 11 min  Sleep Efficiency (%):    83%   Respiratory Indices:   Calculated sAHI (per hour):  7.2/hour         Oxygen Saturation Statistics:    Oxygen Saturation (%) Mean: 91.3%   Minimum oxygen saturation (%):                 76%   O2 Saturation Range (%): 76-97.8%   Time below or at 88% saturation: 41 min   Pulse Rate Statistics:   Pulse Mean (bpm):    76/min    Pulse Range (58-97/min)   Snoring: Mild to loud  IMPRESSION/DIAGNOSES:   OSA (obstructive sleep apnea)  Nocturnal Hypoxemia  RECOMMENDATIONS:   This home sleep test demonstrates overall mild obstructive  sleep apnea - by AHI criteria -but significant desaturations during sleep and evidence of nocturnal hypoxemia.  Total AHI was 7.2/h, O2 nadir 76% with significant time below or at 88% saturation of over 40 minutes for the night with variable snoring detected.  Treatment with a positive airway pressure device is recommended. Treatment can be achieved in the form of autoPAP trial/titration at home for now. A full night, in-lab PAP titration study may aid in improving proper treatment settings and with mask fit, if needed, down the road.  Alternative treatments may include weight loss (where appropriate) along with avoidance of the supine sleep position (if possible), or an oral appliance in appropriate candidates.   Please note that untreated obstructive sleep apnea may carry additional perioperative morbidity. Patients with significant obstructive sleep apnea should receive perioperative PAP therapy and the surgeons and particularly the anesthesiologist should be informed of the diagnosis and the severity of the sleep disordered breathing. The patient should be cautioned not to drive, work at heights, or operate dangerous or heavy equipment when tired or sleepy. Review and reiteration of good sleep hygiene measures should be pursued with any patient. Other causes of the patient's symptoms, including circadian rhythm disturbances, an underlying mood disorder, medication effect and/or an underlying medical problem cannot be ruled out based on this test. Clinical correlation is recommended.  The patient and her referring provider will be notified of the test results. The patient will be seen in follow up in sleep clinic at [facility], as necessary.  I certify that I have reviewed the raw data  recording prior to the issuance of this report in accordance with the standards of the American Academy of Sleep Medicine (AASM).    INTERPRETING PHYSICIAN:   True Mar, MD, PhD Medical Director, [facility]  [facility]) Diplomat, ABPN (Neurology and Sleep)   [facility] [address] [place], [place] [address] [phone]

## 2023-09-14 ENCOUNTER — Encounter: Payer: Self-pay | Admitting: Neurology

## 2023-09-14 NOTE — Telephone Encounter (Signed)
 Called patient. See other encounter for documentation.

## 2023-09-14 NOTE — Telephone Encounter (Signed)
 Spoke with patient and discussed her sleep study results. The patient agrees to start autopap. We discussed the insurance compliance requirements which includes using machine at least 4 hours at night and also being seen by our office between 30 and 90 days after setup. Pt was scheduled for a follow-up on 12/8 at 12:45 pm arrive 12:30. DME will be [employer]. Pt will watch for call within 1 week. She also wanted Dr Buck to know she is still have the episodes where she is extremely tired. I did let her know I would relay message. I encouraged her to give the autopap a try as this may help and also let her know that it can take awhile of consistent use to see improvement in how you feel during the day. She was encouraged to call if her symptoms change however. Patient verbalized understanding and appreciation.

## 2023-09-15 NOTE — Telephone Encounter (Signed)
 New, Stacy [name], Otilio Jefferson, RN; Alain Honey; Jeris Penta, [place]; 1 other Received, thank you!

## 2023-09-19 NOTE — Progress Notes (Signed)
 Electrophysiology Office Note:   Date:  09/20/2023  ID:  Stacy [name], DOB 1975/08/30, MRN [id number]  Primary Cardiologist: Lonni Cash, MD Electrophysiologist: None      History of Present Illness:   Stacy [name] is a 48 y.o. female with h/o fibromyalgia, migraines, mild CAD, HTN, HLD, hypothyroidism who is being seen today for evaluation for ILR at the request of Dr. Cash.  Discussed the use of AI scribe software for clinical note transcription with the patient, who gave verbal consent to proceed.  History of Present Illness Stacy [name] is a 48 year old female who presents with episodes of shortness of breath and leg swelling. She was referred by Dr. Cash for evaluation and potential implantation of a loop recorder.  She experiences episodes of shortness of breath and significant swelling in both legs, with the left leg being more affected. The swelling is accompanied by severe pain in the back of the leg. Despite undergoing blood work, the cause of these symptoms remains unclear. She has a history of wearing short-term heart monitors multiple times with no obvious arrhythmias.  Her insurance, through [place], has approved the loop recorder procedure.  She recently underwent a sleep study and was informed that she is not getting enough oxygen at night, leading to excessive daytime sleepiness. She describes episodes of falling asleep unexpectedly during the day, including falling out of a swing, and experiencing confusion and 'talking like...stupid stuff.' She has been diagnosed with mild sleep apnea and is awaiting treatment with an autopap machine with supplemental oxygen.  She has been experiencing headaches and notes that these symptoms have developed suddenly over the past month. She is scheduled to follow up with a neurologist in early December for further evaluation of her sleep apnea and related symptoms.   Review of systems complete  and found to be negative unless listed in HPI.   EP Information / Studies Reviewed:    EKG is not ordered today. EKG from 03/19/23 reviewed which showed NSR, PR and QRS 96ms.     Zio 04/27/23:  Sinus rhythm with minimum heart rate 60bpm, highest heart rate 140 bpm, average heart rate 90 bpm.  Sinus tachycardia noted during some of the patient's diary entries. Otherwise sinus rhythm present when patient reported symptoms.  Very rare premature atrial ectopic beats (1,365 during the 27 day monitoring period)  Echo 08/31/23:  1. Left ventricular ejection fraction, by estimation, is 55 to 60%. The  left ventricle has normal function. The left ventricle has no regional  wall motion abnormalities. Left ventricular diastolic parameters were  normal. The average left ventricular  global longitudinal strain is -22.3 %. The global longitudinal strain is  normal.   2. Right ventricular systolic function is normal. The right ventricular  size is normal. There is normal pulmonary artery systolic pressure. The  estimated right ventricular systolic pressure is 21.1 mmHg.   3. The mitral valve is grossly normal. Mild mitral valve regurgitation.  No evidence of mitral stenosis.   4. The aortic valve is tricuspid. Aortic valve regurgitation is not  visualized. No aortic stenosis is present.   5. The inferior vena cava is normal in size with greater than 50%  respiratory variability, suggesting right atrial pressure of 3 mmHg.    Physical Exam:   VS:  BP 110/64   Pulse 89   Ht 5' 5.5 (1.664 m)   Wt 237 lb 6.4 oz (107.7 kg)   SpO2 98%   BMI 38.90  kg/m    Wt Readings from Last 3 Encounters:  09/20/23 237 lb 6.4 oz (107.7 kg)  08/12/23 234 lb (106.1 kg)  08/09/23 236 lb 11.2 oz (107.4 kg)     GEN: Well nourished, well developed in no acute distress NECK: No JVD CARDIAC: Normal rate, regular rhythm RESPIRATORY:  Clear to auscultation without rales, wheezing or rhonchi  ABDOMEN: Soft,  non-distended EXTREMITIES:  No edema; No deformity   ASSESSMENT AND PLAN:    Syncope: Recurrent.  Patient referred by Dr. Verlin for loop recorder implant.  She has had multiple short-term cardiac monitors with no obvious cause. -Discussed role of loop recorder and monitoring for an arrhythmia genic cause of her symptoms.  Discussed risk and benefits of loop recorder implant including but not limited to infection, bleeding, pain, damage to nearby structures.  Patient voiced understanding and wants to proceed with loop recorder implant.  Monthly monitoring cost and outpatient implant cost were discussed.  Patient states she was told that this would be covered by her insurance.  Will proceed with loop recorder implant today.  Left lower extremity swelling and discomfort: -Will perform venous Doppler ultrasound.  Sleep apnea: -Encouraged compliance with CPAP therapy.  Follow-up with sleep medicine physician.  SURGEON:  Fonda Kitty, MD     PREPROCEDURE DIAGNOSIS:  Syncope    POSTPROCEDURE DIAGNOSIS: Syncope     PROCEDURES:   1. Implantable loop recorder implantation    INTRODUCTION:  Stacy [name] presents with a history of syncope The costs of loop recorder monitoring have been discussed with the patient.    DESCRIPTION OF PROCEDURE:  Informed written consent was obtained.   Time Out Completed with RN    The patient required no sedation for the procedure today.  Mapping over the patient's chest was performed to identify the area where electrograms were most prominent for ILR recording.  This area was found to be the left parasternal region over the 4th intercostal space. The patients left chest was therefore prepped and draped in the usual sterile fashion. The skin overlying the left parasternal region was infiltrated with lidocaine for local analgesia.  A 0.5-cm incision was made over the left parasternal region over the 3rd intercostal space.  A subcutaneous ILR pocket was  fashioned using a combination of sharp and blunt dissection.  A Medtronic Reveal LINQ 2 implantable loop recorder (serial # [id number]) was then placed into the pocket  R waves were very prominent and measuring 0.14mV.  Steri- Strips and a sterile dressing were then applied.  There were no early apparent complications.     CONCLUSIONS:   1. Successful implantation of a implantable loop recorder for a history of Syncope.  2. No early apparent complications.    Follow up with general cardiologist, Dr. Verlin. EP as needed.   Signed, Fonda Kitty, MD

## 2023-09-20 ENCOUNTER — Ambulatory Visit (HOSPITAL_COMMUNITY)
Admission: RE | Admit: 2023-09-20 | Discharge: 2023-09-20 | Disposition: A | Discharge status: Home / Self Care | Source: Ambulatory Visit | Attending: Cardiology | Admitting: Cardiology

## 2023-09-20 ENCOUNTER — Ambulatory Visit: Attending: Cardiology | Admitting: Cardiology

## 2023-09-20 ENCOUNTER — Ambulatory Visit: Payer: Self-pay | Admitting: Cardiology

## 2023-09-20 ENCOUNTER — Telehealth: Payer: Self-pay | Admitting: Cardiology

## 2023-09-20 ENCOUNTER — Encounter: Payer: Self-pay | Admitting: Cardiology

## 2023-09-20 VITALS — BP 110/64 | HR 89 | Ht 65.5 in | Wt 237.4 lb

## 2023-09-20 DIAGNOSIS — R55 Syncope and collapse: Secondary | ICD-10-CM | POA: Diagnosis not present

## 2023-09-20 DIAGNOSIS — R600 Localized edema: Secondary | ICD-10-CM | POA: Diagnosis not present

## 2023-09-20 DIAGNOSIS — G4733 Obstructive sleep apnea (adult) (pediatric): Secondary | ICD-10-CM

## 2023-09-20 NOTE — Patient Instructions (Addendum)
 Medication Instructions:  Your physician recommends that you continue on your current medications as directed. Please refer to the Current Medication list given to you today.  Labwork: None ordered.  Testing/Procedures: Your physician has requested that you have a lower extremity venous duplex. This test is an ultrasound of the veins in the legs. It looks at venous blood flow that carries blood from the heart to the legs. Allow one hour for a Lower Venous exam. There are no restrictions or special instructions.  Please note: We ask at that you not bring children with you during ultrasound (echo/ vascular) testing. Due to room size and safety concerns, children are not allowed in the ultrasound rooms during exams. Our front office staff cannot provide observation of children in our lobby area while testing is being conducted. An adult accompanying a patient to their appointment will only be allowed in the ultrasound room at the discretion of the ultrasound technician under special circumstances. We apologize for any inconvenience.   Follow-Up:  As needed with Dr. Kennyth   Implantable Loop Recorder Placement, Care After This sheet gives you information about how to care for yourself after your procedure. Your health care provider may also give you more specific instructions. If you have problems or questions, contact your health care provider. What can I expect after the procedure? After the procedure, it is common to have: Soreness or discomfort near the incision. Some swelling or bruising near the incision.  Follow these instructions at home: Incision care  Monitor your cardiac device site for redness, swelling, and drainage. Call the device clinic at [phone] if you experience these symptoms or fever/chills.  Keep the large square bandage on your site for 24 hours and then you may remove it yourself. Keep the steri-strips underneath in place.   You may shower after 72 hours / 3  days from your procedure with the steri-strips in place. They will usually fall off on their own, or may be removed after 10 days. Pat dry.   Avoid lotions, ointments, or perfumes over your incision until it is well-healed.  Please do not submerge in water until your site is completely healed.   Your device is MRI compatible.   Remote monitoring is used to monitor your cardiac device from home. This monitoring is scheduled every month by our office. It allows us  to keep an eye on the function of your device to ensure it is working properly.  If your wound site starts to bleed apply pressure.    For help with the monitor please call Medtronic Monitor Support Specialist directly at [phone].    If you have any questions/concerns please call the device clinic at [phone].  Activity  Return to your normal activities.  General instructions Follow instructions from your health care provider about how to manage your implantable loop recorder and transmit the information. Learn how to activate a recording if this is necessary for your type of device. You may go through a metal detection gate, and you may let someone hold a metal detector over your chest. Show your ID card if needed. Do not have an MRI unless you check with your health care provider first. Take over-the-counter and prescription medicines only as told by your health care provider. Keep all follow-up visits as told by your health care provider. This is important. Contact a health care provider if: You have redness, swelling, or pain around your incision. You have a fever. You have pain that is not relieved by  your pain medicine. You have triggered your device because of fainting (syncope) or because of a heartbeat that feels like it is racing, slow, fluttering, or skipping (palpitations). Get help right away if you have: Chest pain. Difficulty breathing. Summary After the procedure, it is common to have soreness or  discomfort near the incision. Change your dressing as told by your health care provider. Follow instructions from your health care provider about how to manage your implantable loop recorder and transmit the information. Keep all follow-up visits as told by your health care provider. This is important. This information is not intended to replace advice given to you by your health care provider. Make sure you discuss any questions you have with your health care provider. Document Released: 12/03/2014 Document Revised: 02/06/2017 Document Reviewed: 02/06/2017 Elsevier Patient Education  2020 [employer].

## 2023-09-20 NOTE — Telephone Encounter (Signed)
 Patient had loop recorder placed today. Nothing further needed at this time.

## 2023-09-20 NOTE — Telephone Encounter (Signed)
 Pt states her insurance has approved her monitor. She had been told it was not. Her approval code [id number] 08/24/23-11/24/23. SABRA She would like a call back before her appt today if possible. Please advise.

## 2023-09-21 ENCOUNTER — Encounter (HOSPITAL_COMMUNITY)

## 2023-09-24 ENCOUNTER — Encounter: Payer: Self-pay | Admitting: Cardiology

## 2023-09-27 ENCOUNTER — Telehealth: Payer: Self-pay | Admitting: Diagnostic Neuroimaging

## 2023-09-27 NOTE — Telephone Encounter (Signed)
 Pt is asking for a call to discuss her taking her Nurtec along with Tylenol with no relief from headaches.  Pt is having episodes of almost falling, issues re: memory. Pt had a very bad headache on yesterday and the nurtec and tylenol gave her no relief, please call.

## 2023-09-28 ENCOUNTER — Encounter: Payer: Self-pay | Admitting: Cardiovascular Disease

## 2023-09-28 NOTE — Telephone Encounter (Signed)
 Spoke w/Pt regarding headaches. Pt stated Nurtec and Tylenol are no longer working. Pt stated this worked at first but is no longer working. Pt stated she continues to have the spells where she blanks out and doesn't remember how things happened. Pt stated she still takes the gabapentin and lamotrigine. Pt reports having a loop recorder implanted recently and has not started using CPAP yet, is supposed to receive it this Friday. Discussed how using the CPAP may if her body and brain have not been getting adequate rest and/or having apneic episodes. Pt stated she is just frustrated because the episodes of blanking out continue to occur as well as the headaches. Informed Pt will send Dr. Margaret a message for possible recommendations. Pt voiced understanding and thanks for the call.

## 2023-10-01 ENCOUNTER — Encounter: Payer: Self-pay | Admitting: Diagnostic Neuroimaging

## 2023-10-03 ENCOUNTER — Encounter: Payer: Self-pay | Admitting: Diagnostic Neuroimaging

## 2023-10-04 ENCOUNTER — Telehealth: Payer: Self-pay | Admitting: Diagnostic Neuroimaging

## 2023-10-04 NOTE — Telephone Encounter (Signed)
 Spoke w/Pt regarding recent issues and headaches. Informed Pt Dr. Margaret does want to see how Pt does with CPAP before making other recommendations. Also informed Pt Dr. Margaret stated he would see her via Video Visit. Offered Pt VV for Tues 9/30 at 2 PM - Pt accepted. Pt stated she received CPAP on Friday and slept 7 hours but on Sat afternoon had an episode where she was on the phone w/her mother and started talking about something random then didn't respond for a minute or so. She had no recollection of the episode. Informed Pt will make Dr. Margaret aware. Also informed Pt to make her PCP aware of the headaches as PCP was treating her for HA prior to being seen by neurology. Pt voiced understanding and thanks for the call back and appt.

## 2023-10-04 NOTE — Telephone Encounter (Signed)
 Patient stated still have headache no left side of temple on 10/01/23. Nurtec is not helping, also took Excedrin with no relief, Sent MyChatt on 10/01/23, please review MyChart message.

## 2023-10-04 NOTE — Telephone Encounter (Signed)
 Pt had also called office (see phone notes). Spoke w/Pt via phone. Pt scheduled for f/u with Dr. Penumalli for 9/30 at 2 PM.

## 2023-10-04 NOTE — Telephone Encounter (Signed)
 Offered Pt VV for Tues 9/30 at 2 PM - Pt accepted.

## 2023-10-04 NOTE — Telephone Encounter (Signed)
 Pt had also called office. Responded to Pt with phone call. Pt has f/u with Dr. Margaret 9/30 at 2 PM.

## 2023-10-05 ENCOUNTER — Encounter: Payer: Self-pay | Admitting: Diagnostic Neuroimaging

## 2023-10-05 ENCOUNTER — Telehealth (INDEPENDENT_AMBULATORY_CARE_PROVIDER_SITE_OTHER): Admitting: Diagnostic Neuroimaging

## 2023-10-05 DIAGNOSIS — R4689 Other symptoms and signs involving appearance and behavior: Secondary | ICD-10-CM

## 2023-10-05 DIAGNOSIS — R519 Headache, unspecified: Secondary | ICD-10-CM | POA: Diagnosis not present

## 2023-10-05 NOTE — Telephone Encounter (Signed)
 Error

## 2023-10-05 NOTE — Progress Notes (Signed)
 [facility]  PATIENT: Stacy [name] DOB: Apr 10, 1975  REFERRING CLINICIAN: Dwight Trula SQUIBB, MD HISTORY FROM: patient  REASON FOR VISIT: follow up   HISTORICAL  CHIEF COMPLAINT:  Chief Complaint  Patient presents with   Headache    HISTORY OF PRESENT ILLNESS:   UPDATE (10/05/23, VRP): Since last visit, having some left sided constant throbbing headache starting last week. Now slightly improving. Now on CPAP for OSA since last week. Some ongoing speech pausing and zoning out spells.   UPDATE (08/04/23, VRP): Since last visit, now having some issues for excessive daytime sleepiness (falling asleep at dinner, driving, on front porch). Having more issues with memory loss. Has had more depression since separation from husband 3 years ago. Now getting treatment with psychiatry.   UPDATE (01/18/23, VRP): 48 year old female patient here for evaluation of ongoing numbness, tingling, dizziness, abnormal sensations.  Continues to have worsening tremor in the bilateral hands, sometimes throughout her body and internally.  In March 2024 had an episode where she woke up, felt like she could not move, then got up to go to the bathroom.  She was very lightheaded, sweaty, dizzy, hot sensations, almost passed out.  Another episode in July she had intermittent drooling out of the right side of her mouth.  She has been having increasing fatigue and generalized weakness.  UPDATE (06/24/20, VRP): Since last visit, patient here for evaluation of numbness, headaches.  2 months ago patient had episode of tremors and left-sided tingling affecting left face, arm and leg.  She had some nausea associate with this.  She saw cardiology who recommended neurology follow-up for TIA evaluation.  Symptoms lasted several hours and then resolved.  Also with ongoing migraine headaches 1 to 2/week associated with nausea and sensitivity to light.  Since last visit had an EMG which showed bilateral carpal  tunnel syndrome, but she has not gone back to hand surgeon.  PRIOR HPI (1751): 48 year old female here for evaluation of tremors, numbness, headaches.  Patient has had tremors in hands for more than 1 year.  Symptoms worse with stress or anxiety.  Tremor mainly present with certain postures or holding objects.  Also has numbness and tingling in hands for 6 to 12 months.  Also has history of headaches with throbbing sensation, tingling sensation, nausea, photophobia, exertional quality, since fourth grade.   REVIEW OF SYSTEMS: Full 14 system review of systems performed and negative with exception of: as per HPI.   ALLERGIES: Allergies  Allergen Reactions   Isosorbide  Nitrate     Other Reaction(s): headache    HOME MEDICATIONS: Outpatient Medications Prior to Visit  Medication Sig Dispense Refill   ALPRAZolam (XANAX) 0.5 MG tablet Take 0.5 mg by mouth 3 (three) times daily as needed.     aspirin  EC 81 MG tablet Take 1 tablet (81 mg total) by mouth daily. Swallow whole. 90 tablet 3   DULoxetine  (CYMBALTA ) 60 MG capsule Take 60 mg by mouth at bedtime. Take 2 tablets by mouth once daily     gabapentin (NEURONTIN) 300 MG capsule Take 300 mg by mouth 2 (two) times daily.     lamoTRIgine (LAMICTAL XR) 100 MG 24 hour tablet Take 100 mg by mouth daily.     levothyroxine  (SYNTHROID ) 75 MCG tablet Take 75 mcg by mouth daily before breakfast.     mirabegron ER (MYRBETRIQ) 25 MG TB24 tablet Take 25 mg by mouth daily.     nitroGLYCERIN  (NITROSTAT ) 0.4 MG SL tablet Place 1 tablet (  0.4 mg total) under the tongue every 5 (five) minutes as needed for chest pain. If you require more than two tablets five minutes apart go to the nearest ER via EMS. 25 tablet 3   NURTEC 75 MG TBDP Take 1 tablet by mouth as needed.     pramipexole (MIRAPEX) 1 MG tablet Take 1 mg by mouth at bedtime.     prazosin (MINIPRESS) 2 MG capsule Take 2 mg by mouth daily.     propranolol  (INDERAL ) 10 MG tablet Take 1 tablet (10 mg  total) by mouth 2 (two) times daily. 180 tablet 1   QUEtiapine (SEROQUEL) 25 MG tablet Take 25 mg by mouth at bedtime.     rosuvastatin  (CRESTOR ) 20 MG tablet TAKE 1/2 (ONE-HALF) TABLET BY MOUTH ONCE A WEEK 6 tablet 3   No facility-administered medications prior to visit.    PAST MEDICAL HISTORY: Past Medical History:  Diagnosis Date   Anxiety    CAD (coronary artery disease) 05/28/2022   TTE 06/12/22: EF >75, no RWMA, GR 1 DD, GLS -25.6, normal RVSF, trivial MR, RAP 8 // Myoview  06/26/22: EF 65, normal, low risk   Depression    Depressive disorder    Fibromyalgia    Hypertension    Hypertriglyceridemia    Hypoglycemia    Hypokalemia    Hypothyroidism    Migraine    Nonobstructive atherosclerosis of coronary artery    Numbness and tingling    hands   Palpitations 05/29/2022   Monitor 07/2022: NSR, no AFib, no arrhythmias.      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    FAMILY HISTORY: Family History  Problem Relation Age of Onset   Colon polyps Mother    Diabetes Mother    Hyperlipidemia Mother    Hypertension Mother    Thyroid disease Mother    Colon polyps Father    Bladder Cancer Father        smoker   Stroke Father    Hyperlipidemia Father    Heart disease Father    Hypothyroidism Father    Hypertension Father    Graves' disease Sister    Colon cancer Maternal Uncle    Colon polyps Maternal Grandmother    Colon polyps Paternal Grandfather    Heart attack Paternal Grandfather    Breast cancer Neg Hx    Esophageal cancer Neg Hx    Rectal cancer Neg Hx    Stomach cancer Neg Hx    BRCA 1/2 Neg Hx    Sleep apnea Neg Hx     SOCIAL HISTORY: Social History   Socioeconomic History   Marital status: Legally Separated    Spouse  name: Not on file   Number of children: 2   Years of education: 12   Highest education level: Not on file  Occupational History   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  Tobacco Use   Smoking status: Never   Smokeless tobacco: Never  Vaping Use   Vaping status: Never Used  Substance and Sexual Activity   Alcohol use: Not Currently    Comment: rarely   Drug use: Never   Sexual activity: Yes    Birth control/protection: Surgical  Other Topics Concern   Not on file  Social History Narrative   Lives with son   Regular exercise-yes   Does not work outside the home   Caffeine 1-2 daily   Social Drivers of Corporate investment banker Strain: Not on file  Food Insecurity: Not on file  Transportation Needs: Not on file  Physical Activity: Not on file  Stress: Not on file  Social Connections: Not on file  Intimate Partner Violence: Not on file     PHYSICAL EXAM  GENERAL EXAM/CONSTITUTIONAL: Vitals:  There were no vitals filed for this visit. No data found.   There is no height or weight on file to calculate BMI. Wt Readings from Last 3 Encounters:  09/20/23 237 lb 6.4 oz (107.7 kg)  08/12/23 234 lb (106.1 kg)  08/09/23 236 lb 11.2 oz (107.4 kg)   Patient is in no distress; well developed, nourished and groomed; neck is supple  CARDIOVASCULAR: Examination of carotid arteries is normal; no carotid bruits Regular rate and rhythm, no murmurs Examination of peripheral vascular system by observation and palpation is normal  EYES: Ophthalmoscopic exam of optic discs and posterior segments is normal; no papilledema or hemorrhages No results found.  MUSCULOSKELETAL: Gait, strength, tone, movements noted in Neurologic exam below  NEUROLOGIC: MENTAL STATUS:      No data to display         awake, alert, oriented to person, place and time recent and remote memory intact normal attention and concentration language fluent, comprehension intact, naming  intact fund of knowledge appropriate  CRANIAL NERVE:  2nd - no papilledema on fundoscopic exam 2nd, 3rd, 4th, 6th - pupils equal and reactive to light, visual fields full to confrontation, extraocular muscles intact, no nystagmus 5th - facial sensation symmetric 7th - facial strength SYMM 8th - hearing intact 9th - palate elevates symmetrically, uvula midline 11th - shoulder shrug symmetric 12th - tongue protrusion midline  MOTOR:  NO TREMOR normal bulk and tone, full strength in the BUE, BLE SLIGHTLY FLATTENING OF BILATERAL APB  SENSORY:  normal and symmetric to light touch, pinprick, temperature, vibration; SLIGHTLY DECR PP IN BILATERAL FINGERS DIGITS 1-4  COORDINATION:  finger-nose-finger, fine finger movements normal  REFLEXES:  deep tendon reflexes 2+ and symmetric  GAIT/STATION:  narrow based gait     DIAGNOSTIC DATA (LABS, IMAGING, TESTING) - I reviewed patient records, labs, notes, testing and imaging myself where available.  Lab Results  Component Value Date   WBC 6.4 10/14/2021   HGB 12.7 10/14/2021   HCT 37.8 10/14/2021   MCV 89 10/14/2021   PLT 328 10/14/2021      Component Value Date/Time   NA 140 10/14/2021 1125   K 4.3 10/14/2021 1125   CL 103 10/14/2021 1125   CO2 25 10/14/2021 1125   GLUCOSE 107 (H) 10/14/2021 1125   GLUCOSE 131 (H) 03/19/2021 1659   BUN 12 10/14/2021 1125   CREATININE 0.80 10/14/2021 1125   CALCIUM  9.3 10/14/2021 1125   PROT 6.9 10/14/2021 1125   ALBUMIN 4.5 10/14/2021 1125   AST 12 10/14/2021 1125   ALT 15 10/14/2021 1125   ALKPHOS 104 10/14/2021 1125   BILITOT 0.2 10/14/2021 1125   GFRNONAA >60 08/24/2020 1602   Lab Results  Component Value Date   CHOL 156 06/01/2023   HDL 78 06/01/2023   LDLCALC 65 06/01/2023   LDLDIRECT 80 04/04/2020   TRIG 64 06/01/2023   CHOLHDL 2.0 06/01/2023   Lab Results  Component Value  Date   HGBA1C 5.2 07/05/2019   Lab Results  Component Value Date   VITAMINB12 546 07/05/2019    Lab Results  Component Value Date   TSH 3.270 07/05/2019     04/11/20 TTE Echocardiogram 04/11/2020:  Left ventricle cavity is normal in size and wall thickness. Normal LV  systolic function with EF 74%. Normal global wall motion. Normal diastolic  filling pattern.  Mild (Grade I) mitral regurgitation.  Mild tricuspid regurgitation.  No evidence of pulmonary hypertension.  05/16/20 carotid u/s - Doppler velocity suggests stenosis in the right internal carotid artery  (minimal). Mild homogenous plaque noted in the mid and distal ICA.  - Peak systolic velocities in the left bifurcation, internal, external and common carotid arteries are within normal limits.  - Antegrade right vertebral artery flow. Antegrade left vertebral artery flow.  01/28/23 DATscan  - Ioflupane scan within normal limits. No reduced radiotracer activity in basal ganglia to suggest Parkinson's syndrome pathology.  02/08/23  Unremarkable MRI brain with and without contrast.  No acute findings.  02/08/23 Unremarkable MRI cervical spine with and without contrast.  No acute findings.     ASSESSMENT AND PLAN  48 y.o. year old female here with:   Dx:  1. Left-sided headache   2. Spell of abnormal behavior      PLAN:  INTERMITTENT TREMORS / NUMBNESS / FATIGUE / PRESYNCOPE / MEMORY LOSS - MRI brain, DATscan  normal - unclear cause; could be related to underlying severe depression and OSA - continue CPAP for OSA - follow up with cardiology for syncope evaluation - no driving until event free x 6 months - check EEG  HAND NUMBNESS (bilateral CTS) - follow up with Dr. Murrell for bilateral carpal tunnel syndrome for further tx  MIGRAINE WITHOUT AURA  MIGRAINE PREVENTION  LIFESTYLE CHANGES -Stop or avoid smoking -Decrease or avoid caffeine / alcohol -Eat and sleep on a regular schedule -Exercise several times per week - has tried topiramate  and nurtec in there past (not effective) - consider ajovy and  rizatriptan  in future (caution with polypharmacy)  MIGRAINE RESCUE  - ibuprofen, tylenol as needed - nurtec as needed  Orders Placed This Encounter  Procedures   EEG adult   Return in about 5 months (around 03/04/2024) for MyChart visit (15 min).   Virtual Visit via Video Note  I connected with Delon Devere Hales on 10/05/2023 at  2:00 PM EDT by a video enabled telemedicine application and verified that I am speaking with the correct person using two identifiers.   I discussed the limitations of evaluation and management by telemedicine and the availability of in person appointments. The patient expressed understanding and agreed to proceed.  Patient is at home and I am at the office.   I spent 25 minutes of face-to-face and non-face-to-face time with patient.  This included previsit chart review, lab review, study review, order entry, electronic health record documentation, patient education.      EDUARD FABIENE HANLON, MD 10/05/2023, 2:56 PM Certified in Neurology, Neurophysiology and Neuroimaging  [facility] [address], [address] [place], [place] [address] [phone]

## 2023-10-08 ENCOUNTER — Other Ambulatory Visit: Payer: Self-pay | Admitting: Cardiology

## 2023-10-19 ENCOUNTER — Ambulatory Visit (INDEPENDENT_AMBULATORY_CARE_PROVIDER_SITE_OTHER): Admitting: Diagnostic Neuroimaging

## 2023-10-19 DIAGNOSIS — R4182 Altered mental status, unspecified: Secondary | ICD-10-CM

## 2023-10-19 DIAGNOSIS — R4689 Other symptoms and signs involving appearance and behavior: Secondary | ICD-10-CM

## 2023-10-22 ENCOUNTER — Ambulatory Visit (INDEPENDENT_AMBULATORY_CARE_PROVIDER_SITE_OTHER)

## 2023-10-22 DIAGNOSIS — R55 Syncope and collapse: Secondary | ICD-10-CM | POA: Diagnosis not present

## 2023-10-24 LAB — CUP PACEART REMOTE DEVICE CHECK
Date Time Interrogation Session: 20251016194301
Implantable Pulse Generator Implant Date: 20250915

## 2023-10-25 ENCOUNTER — Encounter: Payer: Self-pay | Admitting: Diagnostic Neuroimaging

## 2023-10-28 NOTE — Progress Notes (Signed)
 Remote Loop Recorder Transmission

## 2023-10-30 ENCOUNTER — Ambulatory Visit: Payer: Self-pay | Admitting: Cardiology

## 2023-11-01 NOTE — Telephone Encounter (Signed)
 Pt called wanting to know when she will be called with EEG results. Spoke to nurse and was informed that the results were not available as of yet but that she will message the provider for an update. Once EEG has been read the pt will be called with results.

## 2023-11-02 ENCOUNTER — Ambulatory Visit: Payer: Self-pay | Admitting: Diagnostic Neuroimaging

## 2023-11-02 NOTE — Telephone Encounter (Signed)
 Per Rojean CMA, She has gone to speak to provider and provider informed her that he will be reviewing it and getting back to the pt.

## 2023-11-02 NOTE — Procedures (Signed)
   [facility]  EEG (ELECTROENCEPHALOGRAM) REPORT   STUDY DATE: 10/19/23 PATIENT NAME: Stacy [name] DOB: 20-Jun-1975 MRN: [id number]  ORDERING CLINICIAN: Eduard Hanlon, MD   TECHNOLOGIST: MARLA Plummer TECHNIQUE: Electroencephalogram was recorded utilizing standard 10-20 system of lead placement and reformatted into average and bipolar montages.  RECORDING TIME: 25 minutes ACTIVATION: hyperventilation and photic stimulation  CLINICAL INFORMATION: 48 year old female with abnormal spell.  FINDINGS: Posterior dominant background rhythms, which attenuate with eye opening, ranging 10-12 hertz and 20-30 microvolts. No focal, lateralizing, epileptiform activity or seizures are seen. Patient recorded in the awake and drowsy state. EKG channel shows regular rhythm of 70-75 beats per minute.   IMPRESSION:   Normal EEG in the awake and drowsy states.     INTERPRETING PHYSICIAN:  EDUARD FABIENE HANLON, MD Certified in Neurology, Neurophysiology and Neuroimaging  [facility] [address] [place], [place] [address] [phone]

## 2023-11-03 NOTE — Telephone Encounter (Signed)
 See other phone message/ mychart.  Dr. Margaret relayed results to pt.

## 2023-11-18 ENCOUNTER — Encounter: Payer: Self-pay | Admitting: Diagnostic Neuroimaging

## 2023-11-18 MED ORDER — AJOVY 225 MG/1.5ML ~~LOC~~ SOAJ
225.0000 mg | SUBCUTANEOUS | 6 refills | Status: AC
Start: 2023-11-18 — End: ?

## 2023-11-18 NOTE — Telephone Encounter (Signed)
 From last VV 10/05/23: MIGRAINE PREVENTION  LIFESTYLE CHANGES -Stop or avoid smoking -Decrease or avoid caffeine / alcohol -Eat and sleep on a regular schedule -Exercise several times per week - has tried topiramate  and nurtec in there past (not effective) - consider ajovy and rizatriptan  in future (caution with polypharmacy)

## 2023-11-21 ENCOUNTER — Other Ambulatory Visit: Payer: Self-pay | Admitting: Cardiology

## 2023-11-21 DIAGNOSIS — E782 Mixed hyperlipidemia: Secondary | ICD-10-CM

## 2023-11-21 DIAGNOSIS — I6529 Occlusion and stenosis of unspecified carotid artery: Secondary | ICD-10-CM

## 2023-11-21 DIAGNOSIS — I2510 Atherosclerotic heart disease of native coronary artery without angina pectoris: Secondary | ICD-10-CM

## 2023-11-21 DIAGNOSIS — I209 Angina pectoris, unspecified: Secondary | ICD-10-CM

## 2023-11-21 DIAGNOSIS — Z79899 Other long term (current) drug therapy: Secondary | ICD-10-CM

## 2023-11-22 ENCOUNTER — Ambulatory Visit (INDEPENDENT_AMBULATORY_CARE_PROVIDER_SITE_OTHER)

## 2023-11-22 DIAGNOSIS — R002 Palpitations: Secondary | ICD-10-CM

## 2023-11-22 LAB — CUP PACEART REMOTE DEVICE CHECK
Date Time Interrogation Session: 20251116235133
Implantable Pulse Generator Implant Date: 20250915

## 2023-11-24 ENCOUNTER — Ambulatory Visit: Payer: Self-pay | Admitting: Cardiology

## 2023-11-24 NOTE — Progress Notes (Signed)
 Remote Loop Recorder Transmission

## 2023-12-10 NOTE — Progress Notes (Unsigned)
 SABRA

## 2023-12-13 ENCOUNTER — Telehealth: Payer: Self-pay | Admitting: Diagnostic Neuroimaging

## 2023-12-13 ENCOUNTER — Encounter: Payer: Self-pay | Admitting: Diagnostic Neuroimaging

## 2023-12-13 ENCOUNTER — Encounter: Payer: Self-pay | Admitting: Adult Health

## 2023-12-13 ENCOUNTER — Telehealth: Admitting: Adult Health

## 2023-12-13 DIAGNOSIS — G4719 Other hypersomnia: Secondary | ICD-10-CM

## 2023-12-13 DIAGNOSIS — R4689 Other symptoms and signs involving appearance and behavior: Secondary | ICD-10-CM

## 2023-12-13 DIAGNOSIS — G4700 Insomnia, unspecified: Secondary | ICD-10-CM

## 2023-12-13 DIAGNOSIS — G4734 Idiopathic sleep related nonobstructive alveolar hypoventilation: Secondary | ICD-10-CM

## 2023-12-13 DIAGNOSIS — G4733 Obstructive sleep apnea (adult) (pediatric): Secondary | ICD-10-CM

## 2023-12-13 NOTE — Telephone Encounter (Signed)
 ..  Pt understands that although there may be some limitations with this type of visit, we will take all precautions to reduce any security or privacy concerns.  Pt understands that this will be treated like an in office visit and we will file with pt's insurance, and there may be a patient responsible charge related to this service. ? ?

## 2023-12-15 NOTE — Telephone Encounter (Signed)
Pt called wanting to know the update on this. Please advise.

## 2023-12-15 NOTE — Telephone Encounter (Signed)
 I called the patient and she plans to call her PCP's office today. Her PCP is due to have her baby this week and is hoping she can see another provider if she is already out of office. If she calls back and is recommended to see you what days can she be scheduled in for for future reference.

## 2023-12-20 NOTE — Progress Notes (Signed)
 New orders have been placed for the above pt, DOB: 08-23-1975 Thanks

## 2023-12-23 ENCOUNTER — Ambulatory Visit

## 2023-12-23 ENCOUNTER — Encounter

## 2023-12-23 ENCOUNTER — Encounter: Payer: Self-pay | Admitting: Adult Health

## 2023-12-23 DIAGNOSIS — R002 Palpitations: Secondary | ICD-10-CM

## 2023-12-23 LAB — CUP PACEART REMOTE DEVICE CHECK
Date Time Interrogation Session: 20251218003233
Implantable Pulse Generator Implant Date: 20250915

## 2023-12-24 ENCOUNTER — Ambulatory Visit: Payer: Self-pay | Admitting: Cardiology

## 2023-12-24 NOTE — Progress Notes (Signed)
 Remote Loop Recorder Transmission

## 2023-12-27 NOTE — Telephone Encounter (Signed)
 Sent another message to DME, order was sent 12/15

## 2024-01-03 NOTE — Telephone Encounter (Signed)
 Pt called  to follow up about order that was suppose to be placed for Pt new mask . Pt  stated that  order for Oxygen  was sent but not for the mask. Pt  called DME to Follow up  and  they stated they never received order .

## 2024-01-11 ENCOUNTER — Telehealth: Payer: Self-pay | Admitting: *Deleted

## 2024-01-11 NOTE — Telephone Encounter (Signed)
 Received ONO results.

## 2024-01-23 ENCOUNTER — Ambulatory Visit: Attending: Cardiology

## 2024-01-23 DIAGNOSIS — I2510 Atherosclerotic heart disease of native coronary artery without angina pectoris: Secondary | ICD-10-CM | POA: Diagnosis not present

## 2024-01-24 ENCOUNTER — Encounter

## 2024-01-24 LAB — CUP PACEART REMOTE DEVICE CHECK
Date Time Interrogation Session: 20260117234409
Implantable Pulse Generator Implant Date: 20250915

## 2024-01-28 NOTE — Progress Notes (Signed)
 Remote Loop Recorder Transmission

## 2024-01-30 ENCOUNTER — Ambulatory Visit: Payer: Self-pay | Admitting: Cardiology

## 2024-02-23 ENCOUNTER — Ambulatory Visit

## 2024-02-24 ENCOUNTER — Encounter

## 2024-03-25 ENCOUNTER — Ambulatory Visit

## 2024-04-25 ENCOUNTER — Ambulatory Visit

## 2024-05-26 ENCOUNTER — Ambulatory Visit

## 2024-06-26 ENCOUNTER — Ambulatory Visit

## 2024-07-27 ENCOUNTER — Ambulatory Visit

## 2024-08-27 ENCOUNTER — Ambulatory Visit

## 2024-09-27 ENCOUNTER — Ambulatory Visit

## 2024-10-28 ENCOUNTER — Ambulatory Visit

## 2024-11-28 ENCOUNTER — Ambulatory Visit

## 2024-12-12 ENCOUNTER — Ambulatory Visit: Admitting: Adult Health
